# Patient Record
Sex: Male | Born: 1935 | Race: White | Hispanic: No | Marital: Married | Smoking: Former smoker
Health system: Southern US, Community
[De-identification: ages and names within clinical notes are randomized; demographics above are authoritative.]

## PROBLEM LIST (undated history)

## (undated) DIAGNOSIS — K439 Ventral hernia without obstruction or gangrene: Secondary | ICD-10-CM

## (undated) DIAGNOSIS — I480 Paroxysmal atrial fibrillation: Secondary | ICD-10-CM

## (undated) DIAGNOSIS — N529 Male erectile dysfunction, unspecified: Secondary | ICD-10-CM

## (undated) DIAGNOSIS — G250 Essential tremor: Secondary | ICD-10-CM

## (undated) DIAGNOSIS — A419 Sepsis, unspecified organism: Secondary | ICD-10-CM

## (undated) DIAGNOSIS — I639 Cerebral infarction, unspecified: Secondary | ICD-10-CM

## (undated) DIAGNOSIS — K631 Perforation of intestine (nontraumatic): Secondary | ICD-10-CM

## (undated) DIAGNOSIS — M1990 Unspecified osteoarthritis, unspecified site: Secondary | ICD-10-CM

## (undated) DIAGNOSIS — R413 Other amnesia: Secondary | ICD-10-CM

## (undated) HISTORY — DX: Essential tremor: G25.0

## (undated) HISTORY — DX: Cerebral infarction, unspecified: I63.9

## (undated) HISTORY — PX: COLON SURGERY: SHX602

## (undated) HISTORY — DX: Unspecified osteoarthritis, unspecified site: M19.90

## (undated) HISTORY — DX: Male erectile dysfunction, unspecified: N52.9

## (undated) HISTORY — DX: Other amnesia: R41.3

## (undated) HISTORY — DX: Sepsis, unspecified organism: A41.9

## (undated) HISTORY — PX: OTHER SURGICAL HISTORY: SHX169

## (undated) HISTORY — DX: Ventral hernia without obstruction or gangrene: K43.9

## (undated) HISTORY — PX: BACK SURGERY: SHX140

## (undated) HISTORY — PX: GALLBLADDER SURGERY: SHX652

## (undated) HISTORY — PX: HERNIA REPAIR: SHX51

## (undated) HISTORY — DX: Paroxysmal atrial fibrillation: I48.0

---

## 2001-07-26 ENCOUNTER — Encounter: Admission: RE | Admit: 2001-07-26 | Discharge: 2001-07-26 | Payer: Self-pay | Admitting: Family Medicine

## 2001-07-26 ENCOUNTER — Encounter: Payer: Self-pay | Admitting: Family Medicine

## 2002-03-13 ENCOUNTER — Emergency Department (HOSPITAL_COMMUNITY): Admission: EM | Admit: 2002-03-13 | Discharge: 2002-03-13 | Payer: Self-pay | Admitting: Emergency Medicine

## 2003-01-31 ENCOUNTER — Emergency Department (HOSPITAL_COMMUNITY): Admission: EM | Admit: 2003-01-31 | Discharge: 2003-02-01 | Payer: Self-pay | Admitting: Emergency Medicine

## 2003-01-31 ENCOUNTER — Encounter: Payer: Self-pay | Admitting: Emergency Medicine

## 2003-02-01 ENCOUNTER — Encounter: Payer: Self-pay | Admitting: Emergency Medicine

## 2003-02-02 ENCOUNTER — Encounter: Payer: Self-pay | Admitting: Emergency Medicine

## 2003-02-02 ENCOUNTER — Inpatient Hospital Stay (HOSPITAL_COMMUNITY): Admission: EM | Admit: 2003-02-02 | Discharge: 2003-02-10 | Payer: Self-pay | Admitting: *Deleted

## 2003-02-04 ENCOUNTER — Encounter (INDEPENDENT_AMBULATORY_CARE_PROVIDER_SITE_OTHER): Payer: Self-pay | Admitting: *Deleted

## 2003-02-06 ENCOUNTER — Encounter (INDEPENDENT_AMBULATORY_CARE_PROVIDER_SITE_OTHER): Payer: Self-pay | Admitting: Cardiology

## 2003-07-16 ENCOUNTER — Observation Stay (HOSPITAL_COMMUNITY): Admission: RE | Admit: 2003-07-16 | Discharge: 2003-07-17 | Payer: Self-pay | Admitting: Neurosurgery

## 2004-02-13 ENCOUNTER — Observation Stay (HOSPITAL_COMMUNITY): Admission: RE | Admit: 2004-02-13 | Discharge: 2004-02-15 | Payer: Self-pay | Admitting: Surgery

## 2004-07-10 ENCOUNTER — Emergency Department (HOSPITAL_COMMUNITY): Admission: EM | Admit: 2004-07-10 | Discharge: 2004-07-10 | Payer: Self-pay

## 2005-05-15 ENCOUNTER — Inpatient Hospital Stay (HOSPITAL_COMMUNITY): Admission: EM | Admit: 2005-05-15 | Discharge: 2005-05-18 | Payer: Self-pay | Admitting: Emergency Medicine

## 2005-05-17 ENCOUNTER — Encounter (INDEPENDENT_AMBULATORY_CARE_PROVIDER_SITE_OTHER): Payer: Self-pay | Admitting: Interventional Cardiology

## 2005-05-25 ENCOUNTER — Encounter: Admission: RE | Admit: 2005-05-25 | Discharge: 2005-07-05 | Payer: Self-pay | Admitting: *Deleted

## 2007-04-03 ENCOUNTER — Inpatient Hospital Stay (HOSPITAL_COMMUNITY): Admission: EM | Admit: 2007-04-03 | Discharge: 2007-04-11 | Payer: Self-pay | Admitting: Emergency Medicine

## 2007-04-05 ENCOUNTER — Encounter (INDEPENDENT_AMBULATORY_CARE_PROVIDER_SITE_OTHER): Payer: Self-pay | Admitting: Surgery

## 2008-03-11 ENCOUNTER — Inpatient Hospital Stay (HOSPITAL_COMMUNITY): Admission: EM | Admit: 2008-03-11 | Discharge: 2008-03-18 | Payer: Self-pay | Admitting: Emergency Medicine

## 2008-03-11 ENCOUNTER — Ambulatory Visit: Payer: Self-pay | Admitting: Internal Medicine

## 2008-03-18 ENCOUNTER — Encounter (INDEPENDENT_AMBULATORY_CARE_PROVIDER_SITE_OTHER): Payer: Self-pay | Admitting: Gastroenterology

## 2008-08-20 ENCOUNTER — Ambulatory Visit: Admission: RE | Admit: 2008-08-20 | Discharge: 2008-08-20 | Payer: Self-pay | Admitting: Surgery

## 2008-09-12 ENCOUNTER — Inpatient Hospital Stay (HOSPITAL_COMMUNITY): Admission: RE | Admit: 2008-09-12 | Discharge: 2008-09-18 | Payer: Self-pay | Admitting: Surgery

## 2008-09-12 ENCOUNTER — Encounter (INDEPENDENT_AMBULATORY_CARE_PROVIDER_SITE_OTHER): Payer: Self-pay | Admitting: Surgery

## 2009-01-15 ENCOUNTER — Encounter: Admission: RE | Admit: 2009-01-15 | Discharge: 2009-01-15 | Payer: Self-pay | Admitting: Family Medicine

## 2010-09-23 LAB — COMPREHENSIVE METABOLIC PANEL
AST: 22 U/L (ref 0–37)
BUN: 9 mg/dL (ref 6–23)
CO2: 25 mEq/L (ref 19–32)
Chloride: 107 mEq/L (ref 96–112)
Creatinine, Ser: 1.39 mg/dL (ref 0.4–1.5)
GFR calc non Af Amer: 49 mL/min — ABNORMAL LOW (ref >60)
Total Bilirubin: 1.8 mg/dL — ABNORMAL HIGH (ref 0.3–1.2)

## 2010-09-23 LAB — CBC
HCT: 36.8 % — ABNORMAL LOW (ref 39.0–52.0)
MCHC: 33.8 g/dL (ref 30.0–36.0)
MCV: 91.2 fL (ref 78.0–100.0)
Platelets: 169 10*3/uL (ref 150–400)
Platelets: 185 10*3/uL (ref 150–400)
RDW: 12.3 % (ref 11.5–15.5)
RDW: 12.6 % (ref 11.5–15.5)
WBC: 7.6 10*3/uL (ref 4.0–10.5)

## 2010-09-23 LAB — BASIC METABOLIC PANEL
BUN: 6 mg/dL (ref 6–23)
Calcium: 8.6 mg/dL (ref 8.4–10.5)
Creatinine, Ser: 1.1 mg/dL (ref 0.4–1.5)
GFR calc non Af Amer: >60 mL/min (ref >60)

## 2010-09-24 LAB — DIFFERENTIAL
Basophils Relative: 1 % (ref 0–1)
Eosinophils Absolute: 0.2 10*3/uL (ref 0.0–0.7)
Eosinophils Relative: 3 % (ref 0–5)
Lymphs Abs: 1.7 10*3/uL (ref 0.7–4.0)
Monocytes Relative: 9 % (ref 3–12)
Neutrophils Relative %: 54 % (ref 43–77)

## 2010-09-24 LAB — CBC
HCT: 38.3 % — ABNORMAL LOW (ref 39.0–52.0)
MCHC: 33.7 g/dL (ref 30.0–36.0)
MCV: 91.4 fL (ref 78.0–100.0)
Platelets: 190 10*3/uL (ref 150–400)
RBC: 4.19 MIL/uL — ABNORMAL LOW (ref 4.22–5.81)

## 2010-09-24 LAB — COMPREHENSIVE METABOLIC PANEL
ALT: 15 U/L (ref 0–53)
Albumin: 3.7 g/dL (ref 3.5–5.2)
Alkaline Phosphatase: 63 U/L (ref 39–117)
Calcium: 8.7 mg/dL (ref 8.4–10.5)
Potassium: 3.9 mEq/L (ref 3.5–5.1)
Sodium: 139 mEq/L (ref 135–145)
Total Protein: 6.5 g/dL (ref 6.0–8.3)

## 2010-09-24 LAB — CEA: CEA: 1 ng/mL (ref 0.0–5.0)

## 2010-10-27 NOTE — Op Note (Signed)
NAME:  [name], [name]                ACCOUNT NO.:  [id number]   MEDICAL RECORD NO.:  [id number]          PATIENT TYPE:  INP   LOCATION:  2921                         FACILITY:  [facility]   PHYSICIAN:  Clovis Pu. Cornett, M.D.DATE OF BIRTH:  [birth year]   DATE OF PROCEDURE:  04/05/2007  DATE OF DISCHARGE:                               OPERATIVE REPORT   PREOPERATIVE DIAGNOSIS:  Abdominal pain.   POSTOPERATIVE DIAGNOSES:  1. Contained perforation of a jejunal diverticulum without      peritonitis.  2. Dense intra-abdominal adhesions from previous surgery.   PROCEDURE:  1. Diagnostic laparoscopy.  2. Laparoscopic lysis of adhesions.  3. Exploratory laparotomy with small-bowel resection and anastomosis.   SURGEON:  Harriette Bouillon, M.D.   ASSISTANT:  Kelle Darting. Rennis Harding, N.P.   ANESTHESIA:  General endotracheal anesthesia.   ESTIMATED BLOOD LOSS:  150 mL.   SPECIMEN:  70 cm segment of jejunum with multiple diverticula  perforation.   DRAINS:  None.   INDICATIONS FOR PROCEDURE:  The patient is an 77 year old male with a  two-day history of abrupt mid abdominal pain.  CT scan showed what  appeared residual diverticulum with some inflammatory change.  There is  some question if he had a microperforation.  On clinical examination he  did not have signs of peritonitis, was admitted for IV antibiotics and  observation.  Over the next 48 hours he did not worsen but did not  improve and I recommended diagnostic laparoscopy for further evaluation.  He is brought today for that.   DESCRIPTION OF PROCEDURE:  The patient brought to the operating room,  placed supine.  After induction of general anesthesia.  Foley catheter  was placed in OR.  Nasogastric tube was placed.  He received additional  dose of Ancef.  I placed a 1 cm incision below his umbilicus.  Dissection was carried down to his fascia.  The fascia was opened with  my finger.  Pursestring suture of 0 Vicryl placed and a 12-mm  Hassan  cannula was placed under direct vision.  Pneumoperitoneum was created to  15 mmHg of CO2 and a laparoscope was placed.  Laparoscopy performed.  He  had a previous ventral hernia repair in his right upper quadrant and  this appeared to Parietex.  He had some omental adhesions and the colon  was pulled up in this but was not directly in contact with the mesh.  I  then placed two 5 mm ports.  One in the patient's left lower quadrant  and second in the right lower quadrant.  A 35-mm port was placed in the  patient's left upper quadrant.  I used a combination of cautery and  scissors to help dissect the omentum down away from the mesh to better  create space in the abdominal cavity so I could further evaluate.  Of  note he had no evidence of free perforation, contamination, succus  entericus or abscess I could see.  Once I got the omentum down off the  mesh somewhat, I was able then to better examine the abdominal cavity.  I found the  ligament of Treitz and took the small bowel and then began  to run the small bowel.  I encountered numerous very large jejunal  diverticula approximately 15 cm from the ligament of Treitz.  As I ran  the bowel, I encountered an area of inflammation and interloop abscess  containing the diverticula.  What appeared to be a perforation had  spontaneously sealed off.  I then ran the remainder of jejunum to the  end.  He had multiple diverticula over roughly a 70 cm segment of  jejunum.  At this point I felt that opening was the next step, so I  could resect this.  After I had reexamined the colon, I found no  evidence of injury from the lysis of adhesions.  Hemostasis of the  omentum was excellent.  I then removed all ports and allowed my CO2 to  escape.   Midline incision was used this below the umbilicus or just above it  about the width of my hand or about 10 cm.  I then dissected down to his  fascia in the midline.  I opened the fascia and was able to  place my  hand in the abdominal cavity.  I removed all the ports at this point in  time.  I then ran the small bowel and removed the segment that was  inflamed.  There are multiple large jejunal diverticula.  I removed the  entire segment and it was found be 70 cm.  He then had residual 130-140  cm plus the 15 proximal to that that were unaffected giving him in total  about 135-140 cm of some residual small bowel.  I went ahead and felt  this area should be resected since it had multiple diverticula with  inflammatory changes and especially one that had perforated.  Using a  GIA 75 stapling device, I divided the bowel at about 15 cm from the  ligament of Treitz.  I then divided distally at the end of the  diverticula which was roughly a 70 cm length.  LigaSure was used take  the mesentery down.  Specimen was passed from the field.  Remarkably he  had no contamination of the intra-abdominal cavity from this.  He had a  small interloop abscess.  A anastomosis was created a side-to-side  functional end-to-end fashion using a GIA 75 stapling device and TA-60  stapling device to close the enterotomy.  I reinforced closure with 3-0  silk sutures.  Mesenteric defect was closed with 3-0 silk.  The  anastomosis was interrogated and felt to be adequate and was widely  patent.  There is no twisting.  This was replaced back the abdominal  cavity.  Irrigation was used and suctioned out.  No evidence of any  injury to the ascending, transverse, descending colon or sigmoid colon.  He did have some sigmoid diverticula.  They were uninflamed.  I was  unable to get to the liver or the  stomach due to the adhesions from  previous mesh repair.  No evidence of injury to any other intra-  abdominal organ noted to include small bowel, large bowel, stomach,  liver or vascular structure.  Irrigation was used, suctioned out.  Counts were counted, found be correct of sponge, needle and instruments.  His fascia was  then closed in a running fashion using #1 PDS.  I used  skin staples to close the skin.  Laparoscopy sites were closed with  staples.  Sterile dressings were applied.  All  final counts of sponge,  instruments and needles were found to be correct at this portion of the  case.  The patient was then awoke taken to recovery in satisfactory  condition.  All final counts were counted, found to be correct a second  time.      Thomas A. Cornett, M.D.  Electronically Signed     TAC/MEDQ  D:  04/05/2007  T:  04/06/2007  Job:  [id number]

## 2010-10-27 NOTE — Consult Note (Signed)
NAME:  [name], [name] NO.:  [id number]   MEDICAL RECORD NO.:  [id number]          PATIENT TYPE:  INP   LOCATION:  1436                         [facility]ACILITY:  [facility]   PHYSICIAN:  Petra Kuba, M.D.    DATE OF BIRTH:  04/06/1921   DATE OF CONSULTATION:  03/13/2008  DATE OF DISCHARGE:                                 CONSULTATION   REASON FOR CONSULTATION:  We were asked to see [name] today in  consultation for possible source of sepsis being his rectal thickening  on CT scan.   HISTORY OF PRESENT ILLNESS:  This is an 77 year old gentleman with a  recent history of heme-positive stool, who is due for a colonoscopy  today as an outpatient with Dr. Wandalee Ferdinand at [facility].  He  was admitted on March 11, 2008 with fever of approximately 102  degrees, vomiting food and bile for 2 days.  He did develop diarrhea  that was initially green and went to dark brown.  He was found to have  sepsis with Klebsiella pneumonia.  The patient denied any blood in his  emesis or visible blood in the stool.  His LFTs on admission were  elevated, specifically his bilirubin was very elevated.  His CT scan  showed rectal thickening.  The patient reports no rectal pain.  No pain  with stooling.  He has seen slight red blood once on his toilet tissue  this admission and attributed this to his irritation from diarrhea.   PAST MEDICAL HISTORY:  1. Significant for diverticulitis with perforated diverticulum.  2. He is status post hemicolectomy in October 2008.  3. He is also status post cholecystectomy, back surgery and hernia      repair.  His hernia repair was unfortunately complicated by a      postop infection.  4. He has hypertension.  5. He has history of a CVA with a very mild residual of decreased      feeling on his right side.  6. History of coronary artery disease.  7. Essential tremor.  8. He has a history of paroxysmal supraventricular tachycardia.   PRIMARY  CARE PHYSICIAN:  Bryan Lemma. Manus Gunning, M.D. with [facility].   CURRENT MEDICATIONS:  1. Aggrenox.  2. Inderal.  3. Vitamins.  4. Mucinex.  5. Occasional Tylenol.   ALLERGIES:  NO KNOWN DRUG ALLERGIES.   REVIEW OF SYSTEMS:  Per HPI.  He mentions no other recent illnesses.   SOCIAL HISTORY:  Positive for a 20 year history of tobacco.  He says he  quit smoking years ago.  He rarely drinks alcohol.   FAMILY HISTORY:  Negative for colon cancer, liver or pancreatic disease.   PHYSICAL EXAMINATION:  GENERAL:  He is alert and oriented.  HEENT:  He has about 4 external mouth sores on his lips.  He refers to  them as fever blisters.  VITAL SIGNS:  Temperature is 98.5, pulse is 72, respirations are 16,  blood pressure is 137/66.  CARDIOVASCULAR:  Regular rate and rhythm.  LUNGS:  Decreased breath sounds on the right.  No  wheezes or crackles  were demonstrated.  ABDOMEN:  Soft, nontender, somewhat distended.  He has good bowel  sounds.  He has well-healed scars, one in the right upper quadrant that  is rather large from his hernia repair.  He also has a central lower  abdominal scar from his bowel surgery as well as a few laparoscopic  scars.   LABORATORY DATA:  Significant for a hemoglobin of 11.4, hematocrit 33.8,  white count 3.6, platelets 97,000. On March 10, 2008, he was  admitted with an AST of 120, ALT 80, alk phos 79, total bilirubin 6.2.  On March 13, 2008, his AST was 46, ALT 50, alk phos 53 and total  bilirubin 1.3.  Back in October 2008, his LFTs were completely normal.   DIAGNOSTICS:  1. Include an abdominal ultrasound that demonstrates no biliary      dilatation.  It does say that the left hepatic lobe is not well      visualized.  2. Abdominal and pelvic CT scan was done on March 11, 2008.  It      mentioned thickening of the rectum and questions proctitis.   ASSESSMENT:  Dr. Vida Rigger has seen and examined the patient, collected  a history and reviewed his  chart.  His assessment says that this is a  very pleasant 77 year old gentleman with Klebsiella pneumonia sepsis  from an undetermined source.  His increased LFTs are normalizing.  His  clinical symptoms are improving and he has no pain from the rectal  thickening on the CT.   PLAN:  Going forward, [name] needs a colonoscopy when he is a little  stronger either as an inpatient this admission or as an outpatient.  Perirectal abscess would be suspect of the source of sepsis.  However,  there is no clear abscess seen on CT and he has no rectal pain as one  would expect him to have with an abscess.  His biliary  system could also be a source of sepsis.  We will check an MRCP to rule  out infection/obstruction of the biliary system.  Dr. Randa Evens or Dr.  Ewing Schlein will continue to follow the patient while he is in the hospital.   Thanks very much this consultation.      Stephani Police, PA    ______________________________  Petra Kuba, M.D.    MLY/MEDQ  D:  03/13/2008  T:  03/14/2008  Job:  [id number]   cc:   Bryan Lemma. Manus Gunning, M.D.  Fax: [phone]   Graylin Shiver, M.D.  Fax: [phone]   Petra Kuba, M.D.  Fax: [phone]

## 2010-10-27 NOTE — Op Note (Signed)
NAME:  [name], [name]                ACCOUNT NO.:  [id number]   MEDICAL RECORD NO.:  [id number]          PATIENT TYPE:  INP   LOCATION:  1436                         FACILITY:  [facility]   PHYSICIAN:  Bernette Redbird, M.D.   DATE OF BIRTH:  1921-03-17   DATE OF PROCEDURE:  03/18/2008  DATE OF DISCHARGE:                               OPERATIVE REPORT   ADDENDUM TO COLONOSCOPY REPORT OF March 18, 2008:  Please see separate  dictated colonoscopy report for today on this patient.   In addition, note that SPOT [place] ink solution was injected at the site  of the mid ascending colon polyp to mark the location in the event that  a surgical resection were required.           ______________________________  Bernette Redbird, M.D.     RB/MEDQ  D:  03/18/2008  T:  03/19/2008  Job:  [id number]

## 2010-10-27 NOTE — Op Note (Signed)
NAME:  [name], [name]                ACCOUNT NO.:  [id number]   MEDICAL RECORD NO.:  [id number]          PATIENT TYPE:  INP   LOCATION:  0003                         FACILITY:  [facility]   PHYSICIAN:  Clovis Pu. Cornett, M.D.DATE OF BIRTH:  Jan 08, 1921   DATE OF PROCEDURE:  09/12/2008  DATE OF DISCHARGE:                               OPERATIVE REPORT   PREOPERATIVE DIAGNOSIS:  Dysplastic polyp x2 right colon.   POSTOPERATIVE DIAGNOSIS:  Dysplastic polyp x2 right colon.   PROCEDURE:  Laparoscopic converted to open right hemicolectomy.   SURGEON:  Dortha Schwalbe, MD   ANESTHESIA:  General endotracheal anesthesia.   ESTIMATED BLOOD LOSS:  150 mL.   ASSISTANT:  Iona Coach, MD   INDICATIONS FOR PROCEDURE:  The patient is an 77 year old patient of Dr.  Molly Maduro Buccini who was found on colonoscopy to have some large  dysplastic polyps involving his right colon.  These were tattooed at the  time.  These were dysplastic polyps too large to be removed with the  endoscope.  I saw him in consultation at the request of Dr. Matthias Hughs and  discussed a right hemicolectomy to clear this disease since these were  dysplastic and it was unknown if there was invasive malignancy involved  especially in the larger polyp in the ascending colon.  I talked to him  about surgery and had his cardiologist see him and clear him since he is  77 years old and did have some significant comorbidities.  I discussed  alternatives to surgery which include observation but the patient wished  to proceed.  Risk of bleeding, infection, anastomotic problem,  conversion to an open procedure, injury to neighboring structures,  myocardial infarction, stroke, prolonged hospitalization, prolonged  wound healing, bowel obstruction were all discussed preoperatively with  the patient as potential complications of the procedure.  He understood  this.   DESCRIPTION OF PROCEDURE:  The patient was brought to the operating  room  and placed supine.  After induction of general anesthesia the abdomen  was prepped and draped in a sterile fashion.  Foley catheter was placed  and he received preoperative antibiotics.  Attempt was made to use the  OptiView port to gain access.  In the left lower quadrant a small  incision was made and 5 mm OptiView was advanced through the abdominal  wall with the camera to guide.  We got into the abdominal cavity but due  to the significant amount of intra-abdominal adhesions could not create  space adequate for a laparoscopic procedure.  At this point in time I  elected to go ahead and convert to an open procedure.  Of note, he also  had a larger upper quadrant hernia that was a recurrent hernia from  previous repair that has been asymptomatic.   We passed laparoscopic instruments off of the field.  Then an incision  was used to gain access into the abdominal cavity.  Small incisional  hernias were noted from previous midline laparotomy.  Once we entered  the abdominal cavity in his left lower quadrant were dense intra-  abdominal adhesions  to the anterior abdominal wall and I took these down  with cautery and Metzenbaum scissors.  I examined the area where the  OptiView entered and saw no evidence of injury to the colon or small  bowel.  Previous anastomosis small bowel anastomosis was noted from  previous small bowel resection.  The right side was relatively clear of  adhesions until you got up in the right upper quadrant and previous mesh  placed from hernia repair was densely adherent to the omentum into the  hepatic flexure of the right colon.  I was able to mobilize the right  colon along the white line of Toldt using cautery until I got up into  the right upper quadrant.  I then found the transverse colon and the  hepatic flexure was densely adherent to the omentum and to the mesh.  This took quite a bit of time and we were able to lyse the adhesions to  expose the  hepatic flexure of the colon and then I was able to mobilize  this down into the operative field better.  LigaSure was used.  We had  some oozing from the mesentery controlled with cautery and a small piece  of Surgicel.  Once the entire ascending and transverse colon just distal  to the middle colic vessels were exposed there was tattoo ink in the  ascending colon from previous biopsy of the two large polyps by Dr.  Matthias Hughs.  Once this was done we went ahead and chose points of resection  in the distal small bowel and in the mid transverse colon.  Small  windows were made at this point in the bowel with hemostats.  We then  took the mesentery down with a LigaSure and 2-0 Vicryl ties until the  entire mesentery was taken up.  We then constructed a side-to-side  functional end-to-end anastomosis using a GIA75 stapling device and a  TA50 to amputate the specimen.  We passed this to a back table and I  opened it.  At the tattoo mark was a flat appearing polyp.  There were  two ink marks there but I did not see the second polyp.  There was some  thickening in the cecum as well which appeared to be a flat polyp.  This  corresponded to his endoscopic report even though I did not see two  distinct polyps the large polyp that was tattooed and biopsied was  dysplastic and was occluded in the specimen.  I saw no evidence of any  other ink marks and felt this explained his endoscopic findings.  This  was sent to pathology for evaluation.  The anastomosis was inspected and  found to be hemostatic.  A stitch was placed in-between at the distal  end of the staple line.  The mesentery was closed with 3-0 Vicryl.  Irrigation was used to suction out the right upper quadrant.  We  examined the small bowel from ligament of Treitz region all the way  through and saw no evidence of injury.  The anastomosis of the previous  small bowel resection was noted.  The sigmoid colon was grossly normal.  The liver was  grossly normal.  The mesh was kept in place in the right  upper quadrant.  The stomach was grossly normal.  We then closed the  abdominal wall with a running #1 Prolene given his previous mesh to  close it up since some of this had to be opened and interrupted 0  Novofil.  Skin staples were used to close the skin.  All final counts,  sponge, needle and instruments were found to be correct this portion of  the case.  The patient was awakened and taken to the recovery room in  satisfactory condition.      Thomas A. Cornett, M.D.  Electronically Signed     TAC/MEDQ  D:  09/12/2008  T:  09/12/2008  Job:  [id number]   cc:   Bernette Redbird, M.D.  Fax: [phone]

## 2010-10-27 NOTE — Op Note (Signed)
NAME:  [name], [name]                ACCOUNT NO.:  [id number]   MEDICAL RECORD NO.:  [id number]          PATIENT TYPE:  INP   LOCATION:  1436                         FACILITY:  [facility]   PHYSICIAN:  Bernette Redbird, M.D.   DATE OF BIRTH:  1921-02-13   DATE OF PROCEDURE:  03/18/2008  DATE OF DISCHARGE:  03/18/2008                               OPERATIVE REPORT   PROCEDURE:  Colonoscopy with biopsy and directed submucosal injection.   INDICATIONS:  An 77 year old gentleman who was admitted recently to the  hospital with Klebsiella sepsis, the source of which was never  identified, and who was noted to be heme-positive but not anemic on  admission.  He actually had been arranged to undergo colonoscopy by Dr.  Evette Cristal as an outpatient because of heme positivity prior to this  admission.   FINDINGS:  Moderately large benign-appearing proximal colonic polyps,  biopsied.  Diminutive distal polyp.  Moderately severe left-sided  diverticulosis.   PROCEDURE:  The nature, purpose and risks of the procedure had been  reviewed with the patient prior to the procedure and he provided written  consent.  Sedation for this procedure and the upper endoscopy which  preceded it totaled fentanyl 80 mcg and Versed 5 mg IV without clinical  instability.  Digital exam of the rectum was unremarkable.  The Pentax  adult video colonoscope was inserted and advanced with moderate  difficulty around somewhat angulated fixated colon using a little bit of  external abdominal compression to get the scope to enter the base of the  cecum which was identified by visualization of the ileocecal valve and  the appendiceal orifice.  I briefly attempted to enter the terminal  ileum but was not successful and did not persistent in efforts to do so.   It should be noted that there was a question of some rectal thickening  on the patient's CT scan during this admission, but no rectal mucosal  abnormalities or mass effect were  noted.   On the way in, at 30 cm, I encountered a 2 to 3 mm semipedunculated  polyp which was removed by a single cold biopsy.   In the ascending colon, 2 or 3 haustrations above the cecum, there was a  roughly 1.5 cm x 4 cm smooth, pink polypoid lesion.  It appeared it  would be difficult to remove without substantial risk, having to elevate  it and probably carve it off piecemeal.  In this 77 year old patient with a  proximal colonic location of the polyp, I felt that the risk of removal  of this lesion would probably exceed the benefits so instead I took  multiple biopsies to ascertain the histologic character of the lesion  and confirm its benignity and the absence of high-grade dysplasia.   Upon further withdrawal of the scope, I encountered a roughly 2 to 3 cm  very sessile flap verrucous polyp just a short distance above (distal)  to the above-mentioned polyp, so we were probably in the mid descending  colon.  Going through the same thought process, I felt it would be safer  and  more appropriate simply to biopsy this lesion than attempt to remove  it.  Several biopsies were obtained.   In the left colon, there was quite a bit of diverticular change.   No obvious tumors or cancer were seen.  I did not see colitis or  vascular ectasia.  Retroflexion in the rectum was unremarkable.   I really did not see any obvious source of heme positivity on this exam  since none of the polyps encountered were hemorrhagic in character.   The patient tolerated the procedure well and there no apparent  complications.   IMPRESSION:  1. Several polyps encountered as described above, biopsied but not      removed so as to reduce procedural risks.  2. Extensive left-sided diverticulosis.  3. History of heme-positive stool without obvious explanation on      current exam.  4. Radiographic finding of rectal thickening, without any evident      pathology in the rectum seen on current exam.    PLAN:  1. Await pathology results.  2. If high-grade dysplasia is found in either of the proximal colonic      polyps, I would consider an elective right hemicolectomy.  If not,      in view of the patient's 77 year old advanced age and comorbidities, I would      consider a repeat colonoscopy in a couple of years if he remains in      reasonably stable medical health, to rebiopsy the lesions and make      sure they are not evolving in an adverse histologic direction.  On      the other hand, if the patient develops more significant medical      problems, and no high-grade dysplasia is currently found, it is      unlikely that these lesions would post a threat to him in his      expected natural lifetime.           ______________________________  Bernette Redbird, M.D.     RB/MEDQ  D:  03/18/2008  T:  03/18/2008  Job:  [id number]

## 2010-10-27 NOTE — Op Note (Signed)
NAME:  [name], [name]                ACCOUNT NO.:  [id number]   MEDICAL RECORD NO.:  [id number]          PATIENT TYPE:  INP   LOCATION:  1436                         FACILITY:  [facility]   PHYSICIAN:  Bernette Redbird, M.D.   DATE OF BIRTH:  1921/04/22   DATE OF PROCEDURE:  03/18/2008  DATE OF DISCHARGE:                               OPERATIVE REPORT   PROCEDURE:  Upper endoscopy.   INDICATION:  Heme-positive stool in an 77 year old gentleman who  presented to the hospital with Klebsiella sepsis.  Of note, the patient  was on aspirin prior to admission.  He is not anemic, or only minimally  anemic with a hemoglobin of 12.7 and a normal MCV,   FINDINGS:  Small hiatal hernia with Schatzki's ring, otherwise normal  exam.   PROCEDURE:  The nature, purpose and risks of the procedure were  discussed with the patient immediately prior to the procedure.  This  procedure was done, in effect, as an add-on to the previously planned  colonoscopy.  I felt that since he had a history of aspirin exposure and  was heme-positive, it was appropriate to check out the upper tract.   Sedation was fentanyl 50 mcg and Versed 3 mg IV without clinical  instability.   The Pentax adult video endoscope was passed under direct vision.  The  larynx and vocal cords looked normal.  The esophagus was readily  entered.   The esophagus was normal apart from a partial esophageal mucosal ring  (Schatzki's ring) and a small hiatal hernia.  There was no tight ring,  stricture, reflux esophagitis, Barrett's esophagus, varices, infection  or evidence of esophageal neoplasia.   The stomach contained essentially no residual and had normal mucosa with  particular reference to the antrum, where there was no evidence of any  aspirin related inflammatory change.  No erosions, ulcers, polyps or  masses were seen anywhere in the stomach including a retroflexed view of  the cardia which was normal in appearance.  The pylorus,  duodenal bulb  and second duodenum also looked normal.   The scope was removed from the patient.  He tolerated the procedure well  and there were no apparent complications.   IMPRESSION:  1. Heme-positive stool, without evident source on current examination.  2. Minimal hiatal hernia and Schatzki's ring.   PLAN:  Proceed to colonoscopic evaluation.           ______________________________  Bernette Redbird, M.D.     RB/MEDQ  D:  03/18/2008  T:  03/18/2008  Job:  [id number]   cc:   Bryan Lemma. Manus Gunning, M.D.  Fax: [phone]

## 2010-10-27 NOTE — Discharge Summary (Signed)
NAME:  [name], [name]                ACCOUNT NO.:  [id number]   MEDICAL RECORD NO.:  [id number]          PATIENT TYPE:  INP   LOCATION:  5738                         FACILITY:  [facility]   PHYSICIAN:  [name]             DATE OF BIRTH:  01-23-1921   DATE OF ADMISSION:  04/03/2007  DATE OF DISCHARGE:  04/11/2007                               DISCHARGE SUMMARY   ADMITTING PHYSICIAN:  Randall Fus A. Hayden, M.D.   DISCHARGING PHYSICIAN:  [name]   PRIMARY CARE PHYSICIAN:  [name].   CHIEF COMPLAINT/REASON FOR ADMISSION:  [name] is an 77 year old male  patient who developed abrupt abdominal pain at 10:30 on the morning of  admission.  This began after eating.  He presented to the ER at [facility].  Because of the severity of his pain and his cardiovascular history,  there was some concern he may have an abdominal aortic aneurysm so an  emergent noncontrast CT was ordered by the emergency room physician.  No  aneurysm was identified but there was an unusual finding in the small  bowel.  There was some small bowel wall thickening and a localized  microperforation.  Since the onset of symptoms, the patient has not  passed any flatus and his pain has decreased since receiving morphine in  the ER.   ON CLINICAL EXAM:  The patient was unremarkable except his abdomen was  distended, slightly tympanitic, with a few popping isolated bowel  sounds.  He was tender with guarding in the midabdomen just above the  umbilicus, slightly tender in the pelvic area but more so on the left  than on the right.  His white count was normal at 5,000, neutrophils  were 74%, potassium 4.5, creatinine 1.3.  The patient was admitted with  a diagnosis of abdominal pain, probably secondary to  microperforation/Meckel's diverticulum.   HOSPITAL COURSE:  The patient was admitted to the step-down unit for  monitoring due to his cardiovascular history and also increased concerns  that, if he had a true  perforation, he could decompensate quickly.  Hospital day 1, a nuclear med Meckel scan was ordered to determine if he  had a Meckel's diverticulum.  This was negative but patient was still  quite tender.  He was set up to undergo diagnostic laparoscopy for  hospital day 2.  In the interim, the patient was continued on empiric  antibiotic therapy with Zosyn IV.   On April 05, 2007, the patient was taken to the OR by [name]  where he underwent a diagnostic laparoscopy with subsequent exploratory  laparotomy for a contained perforation of the jejunal diverticulum  without peritonitis with dense intra-abdominal adhesions from prior  surgeries noted.  He underwent laparoscopic lysis of adhesions and a  subsequent open small bowel resection with primary anastomosis.  The  patient tolerated the procedure well and was sent back to the step-down  unit for recovery in the postoperative period.  He did not require  mechanical ventilation was continued on IV Zosyn.   In the postoperative  period, the patient essentially did very well.  Because of concerns for possible postoperative ileus, the patient had a  PICC line placed and was started on TNA for nutritional support.  He did  have a mild postoperative ileus but his NG tube was able to be  discontinued by postoperative day #4.  His activity was increased and,  by postoperative day 5, he was advanced from a clear liquid diet to  regular diet and PCA was discontinued in favor of using Vicodin and  current bag of TNA was infused and not reordered.   On postoperative day 6, the patient was afebrile, his vital signs were  stable.  His room air sats were 95%.  He was tolerating a solid diet.  His operative pathology showed no malignancy and prealbumin level had  been checked and this was 7.7.  The patient had been ambulating with  minimal assistance in the room but had not ambulated in the hallways.  Therefore, a PT evaluation was requested to  determine if the patient was  appropriate for discharge home and, if so, if he needed any assistive  devices versus patient may need to be transferred or discharged to a  short-term rehabilitation facility.  In talking with the patient and his  wife, they felt he was doing much better since he was transferred up to  5700 and they felt he probably would not need to go to rehab but they  were agreeable to a PT evaluation.   A regards to abdominal wound, his abdomen was stable.  He had bowel  sounds.  There was staple reaction at the insertion site but no actual  redness or drainage.  The umbilical region had some redness and some  dark skin consistent with early possible tissue necrosis but there was  no drainage or tenderness there and plans were to discontinue every  other wound staple and apply Steri-Strips, discontinue all the umbilical  staples and all of the other abdominal staples outside the midline wound  prior to discharge.   At this point, pending PT evaluation, the patient is otherwise deemed  appropriate for discharge home.  We will order any recommendations from  PT for either assistive devices, etc., if indicated.   FINAL DISCHARGE DIAGNOSES:  1. 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  2. Status post diagnostic laparoscopy with laparoscopic lysis of      adhesions with subsequent exploratory laparotomy with small bowel      resection and anastomosis on April 05, 2007 by [name].  3. Postoperative ileus, resolved.  4. Severe protein calorie malnutrition requiring TNA this admission.  5. Chronic medical problems of hypertension and cerebrovascular      accident, stable this admission.   DISCHARGE MEDICATIONS:  The patient will resume the following home  medications.  1. Aggrenox one b.i.d.  2. Inderal LA 60 mg daily.  3. Vitamin E 400 mg daily.  4. Centrum Silver daily.  5. Vicodin 5/325 one  to two tablets every 4 hours as needed for pain      #40 dispensed.  6. Over-the-counter Colace 2-3 times daily to prevent constipation.   WOUND CARE:  Allow Steri-Strips to fall off, pat remaining staples and  wound dry.   DIET:  Low sodium, heart healthy.   ACTIVITY:  Increase activity slowly.  May walk up steps.  May shower.  No lifting for 5 weeks greater than 10 pounds.  No driving while taking  the Vicodin.   FOLLOW-UP APPOINTMENTS:  You are to see [name] on Wednesday,  November 5th, at 2:30 p.m.  This is in regards to having remaining wound  staples removed.      Allison L. Rennis Harding, N.P.    ______________________________  [name]    ALE/MEDQ  D:  04/11/2007  T:  04/11/2007  Job:  [id number]   cc:   Thomas A. Hayden, M.D.  Randall Lemma. Manus Gunning, M.D.

## 2010-10-27 NOTE — H&P (Signed)
NAME:  [name], [name]                ACCOUNT NO.:  [id number]   MEDICAL RECORD NO.:  [id number]          PATIENT TYPE:  INP   LOCATION:  1436                         FACILITY:  [facility]   PHYSICIAN:  Donalynn Furlong, MD      DATE OF BIRTH:  April 25, 1921   DATE OF ADMISSION:  03/11/2008  DATE OF DISCHARGE:                              HISTORY & PHYSICAL   PRIMARY CARE PHYSICIAN:  Dr. Blair Heys with [facility].   CHIEF COMPLAINT:  Fever, chills, nausea, vomiting.   HISTORY OF PRESENT ILLNESS:  [name] is an 77 year old  Caucasian male who lives in [place] with his wife.  He presented to  [facility] Emergency Room tonight with a complaint of chills, nausea,  vomiting starting on afternoon of September 26.  He had nausea, vomiting  and chills with subsequently development of fever up to 99 on September  26.  His wife provided him liquids and fever-controlled medicine which  helped him.  Next, he also had nausea, vomiting associated with fever up  to 102 on September 27.  This is why he presented to ER for further  workup.  Patient had fever, chills, nausea, vomiting but no abdominal  pain or diarrhea, constipation, blood in the stool at this time.  Patient has history of small intestinal perforation with occasional  diverticulum and peri-intestinal abscess status post small bowel  resection in October 2008.  The patient has history of cholecystectomy  in the past.  Patient denied any recent lower extremity tenderness, clot  in the legs, chest pain, shortness of breath, cough, sputum production,  urinary complaints, back pain, sinus symptoms, visual problem, ear  problem, headache, mental status changes with his current symptom.  Patient has been doing well prior to developmental this episode.  Patient denied any sick contacts also.   PAST MEDICAL HISTORY:  1. Hypertension.  2. CVA.  3. Coronary artery disease.  4. Essential tremor.  5. Paroxysmal supraventricular tachycardia.  6. Cholecystectomy.  7. History of small bowel diverticulitis and peri-intestinal abscess      status post surgical resection in October 2008.   FAMILY HISTORY:  Unremarkable.   SOCIAL HISTORY:  Patient lives with his wife in [place] and no recent  alcohol, drug or tobacco use.   REVIEW OF SYSTEM:  Positive as per HPI, otherwise negative review of  systems done for 14-system.   ALLERGIES:  None.   HOME MEDICATIONS:  Include Aggrenox, Inderal, Mucinex, multivitamin  every day along with occasional Tylenol.   PHYSICAL EXAMINATION:  VITAL SIGNS:  Blood pressure 106/55.  Pulse 80.  Respiration 20.  Temperature 101.9.  Oxygen saturation 95% on room air.  GENERAL:  Alert and oriented x3 laying in bed, not in acute distress.  CARDIOVASCULAR:  S1, S2 regular rhythm, no murmur, rub, gallop.  LUNGS:  Clear to auscultation bilaterally.  No wheezing, rales,  crackles.  ABDOMEN:  Nontender, nondistended.  Scars from previous surgery noted.  Bowel sounds present.  No organomegaly.  No splenic or liver tenderness  noted.  No flank tenderness over the kidneys.  LEGS:  No clots or no swelling in both legs.  pulses palpable in all 4  extremities.  HEENT:  Normocephalic, nontraumatic.  EYES:  Pupils equally reactive to light and accommodation.  Extraocular  muscles intact.  SINUSES:  No sinus tenderness noted.  NECK:  No thyromegaly or JVD.  No meningeal signs positive.  ORAL : mucous membrane moist, no thrush noted.  LYMPHATICS:  No lymph nodes enlarged.  SKIN:  No rash or bruits.  NEUROLOGIC:  Shows intact cranial nerves, muscle strength, sensation and  reflexes.  Speech is normal.   LABORATORIES:  Shows chest x-ray unremarkable.  Lactic acid level  normal.  Lipase level normal.  Complete metabolic panel unremarkable  except glucose 128, creatinine 1.680, bilirubin 6.2, SGOT 20, SGPT 80.  Urine analysis unremarkable.  Urine microscopy unremarkable for  infection.  CBC with differential  shows WBC 8.3, neutrophils 91%.   ASSESSMENT AND PLAN:  1. Fever, chills, nausea, vomiting with elevated limited liver enzymes      and differential of gastritis, pancreatitis, liver infection or      hepatitis, influenza, diverticulitis.  2. History of small bowel diverticulitis and peri-intestinal abscess      status post surgical resection in October 2008.  3. History of cholecystectomy.  4. History of hypertension.  5. History of coronary artery disease.  6. History of cerebrovascular accident.  7. History of essential tremor.   PLAN:  We will admit patient to telemetry bed under [facility] team with  the diagnoses of fever, chills, nausea, vomiting.  We will check vitals  and inputs and outputs every 4 hours.  We will check CBC and CMP in the  morning.  We will continue IV fluids for hydration at this time.  We  will start antibiotic Zosyn 4.5 g IV every 6 hours for coverage of  bacterial infection.  At this time there is no clue where the infection  is but the most likely source can be from abdomen given the history and  the persistent symptoms and elevated liver enzymes.  CT scan of abdomen  is pending at this time.  We will review it and decide further  management.  Patient does not have any surgical emergencies at this time  from the abdominal exam also.  Further plan according to the lab and  workup pending.      Donalynn Furlong, MD  Electronically Signed    TVP/MEDQ  D:  03/11/2008  T:  03/11/2008  Job:  [id number]   cc:   Bryan Lemma. Manus Gunning, M.D.  Fax: [phone]   Hollice Espy, M.D.

## 2010-10-27 NOTE — Discharge Summary (Signed)
NAME:  [name], [name]                ACCOUNT NO.:  [id number]   MEDICAL RECORD NO.:  [id number]          PATIENT TYPE:  INP   LOCATION:  1436                         [facility]ACILITY:  [facility]   PHYSICIAN:  Hollice Espy, M.D.DATE OF BIRTH:  07/15/1920   DATE OF ADMISSION:  03/10/2008  DATE OF DISCHARGE:  03/18/2008                               DISCHARGE SUMMARY   PRIMARY CARE PHYSICIAN:  Dr. Jeanmarie Hubert.   CONSULTANTS ON THE CASE:  Dr. Matthias Hughs, [facility].   DISCHARGE DIAGNOSES:  1. Klebsiella sepsis, source not known.  2. Liver lesions, increased in size.  3. Chronic sinusitis.  4. Colon polyps status post biopsy.  5. History of coronary artery disease and cerebrovascular accident.   DISCHARGE MEDICATIONS:  Are as follows:  Cipro 500 mg p.o. b.i.d. times  15 more days, this will cover for a total of 3 weeks therapy covering  for both his sepsis as well as for chronic sinusitis infection.  The  patient will continue all of his previous medications, these are as  follows:  1. Aggrenox 1 tablet p.o. b.i.d.  2. Inderal 60 p.o. daily.  3. Vitamin E 400 daily.  4. Multivitamin daily.  5. One cap Lutein daily.  6. Mucinex b.i.d.   HOSPITAL COURSE:  The patient an 77 year old white male, past medical  history of CAD and diverticulitis who presented with nausea, vomiting  and a temperature of 101.9.  On hospital day 2 the patient's blood  cultures came back positive for Klebsiella pneumoniae and his  antibiotics were initially changed to Cipro, Flagyl for gut coverage,  when the sensitivities came back he was kept on the Cipro only.  A  source was considered, his urine and chest x-ray were remarkable.  After  a brief discussion with infectious disease they recommended looking at  GI as well as his sinuses.  A CT scan of the sinuses showed signs of  chronic sinusitis but no signs of acute infection.  A CT scan of the  abdomen noted simply increased rectal area concerning for proctitis as  well as on the ultrasound of his abdomen this too was normal as well.  GI was consulted given the fact that no definitive source came out as  well as the increased rectal thickening.  Patient was evaluated and they  felt that the first place to look would be his gallbladder, even though  he is status post cholecystectomy he may have a bruised duct that may be  causing some increased infection.  The gallbladder area looked  unremarkable and no signs of biliary obstruction or choledocholithiasis  was noted, a total of 3 nonspecific T2 hyperintense structures were  identified within the right hepatic lobe.  The largest of these  structures demonstrated an irregular nonspecific enhancement pattern,  when compared to a CT with contrast back in 2004 there were small  corresponding hypodense lesions in these areas however these lesions  were larger.  Metastatic disease could not be excluded.  It is thought  that maybe these could be atypical hemangiomas as well.  They  recommended a PET  scan versus repeat MR following the patient's  completion of antibiotics.  The patient otherwise continued to do well.  He was noted on admission to have some mild renal failure as well as  transaminitis, it was felt his transaminitis was secondary to shock  liver and with IV fluid both his renal function and liver function  returned to completely normal.  This was not felt related to be related  to his liver findings.  The patient was evaluated by GI and following an  essentially negative for biliary obstruction MRCP the patient underwent  a colonoscopy done on March 18, 2008.  At time he was noted to have a  few colon polyps.  Dr. Matthias Hughs, given the various size of the polyps,  the patient's advanced age, 77 the risk for perforation of bladder and  rebleeding elected not to remove the polyps but actually just take  biopsies.  The patient is felt to otherwise do well.  The rest of his  medical issues were stable  during his hospitalization.  Plan will be for  patient to be discharged to home on March 18, 2008, two more weeks of  p.o. Cipro to complete a total of 3 weeks of therapy that will cover  both his sepsis as well as his sinusitis.  He will follow up with his  PCP, Dr. Manus Gunning, in the next one week's time.  At that time the polyp  biopsies will be followed up as well as a decision whether or not  patient will get a PET scan versus a followup MRI in 3 months.  The  patient's overall disposition improved.   ACTIVITY:  As tolerated.   DIET:  Heart-healthy, low residue.   Being discharged to home.      Hollice Espy, M.D.  Electronically Signed     SKK/MEDQ  D:  03/18/2008  T:  03/19/2008  Job:  [id number]   cc:   Bernette Redbird, M.D.  Fax: [phone]   Bryan Lemma. Manus Gunning, M.D.  Fax: [phone]

## 2010-10-27 NOTE — H&P (Signed)
NAME:  [name], [name]                ACCOUNT NO.:  [id number]   MEDICAL RECORD NO.:  [id number]          PATIENT TYPE:  EMS   LOCATION:  MAJO                         FACILITY:  [facility]   PHYSICIAN:  Clovis Pu. Hayden, M.D.DATE OF BIRTH:  May 04, 1921   DATE OF ADMISSION:  04/03/2007  DATE OF DISCHARGE:                              HISTORY & PHYSICAL   PRIMARY CARE PHYSICIAN:  Molly Maduro R. Manus Gunning, M.D.   CHIEF COMPLAINT:  Abdominal pain.   HISTORY OF PRESENT ILLNESS:  [name] is an 86-year male patient with  history of prior abdominal procedures and stroke in 2006.  He developed  abrupt constant abdominal pain about 10:30 this morning after eating a  light breakfast.  He presented to the ER because of the severity of the  pain.  His white count and neutrophil count was normal, but because of  the severity of pain and concerns with a possible emergent condition  such as a leaking abdominal aortic aneurysm and non-contrasted CT was  ordered by the emergency room physician.  No aneurysm was identified.  The patient did have small bowel wall thickening and localized  microperforations.  He has subsequently  received morphine and has  decreased resting abdominal pain.  He has not passed any flatus since  the onset of his pain.  Surgical evaluation has been requested for  admission.   REVIEW OF SYSTEMS:  As above.   PAST MEDICAL HISTORY:  1. Left pontine infarct by CVA 2006.  2. Hypertension.  3. Postop paroxysmal supraventricular tachycardia treated by Dr. Verdis Prime in 2004.  4. 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  5. Essential tremor.   PAST SURGICAL HISTORY:  1. Open cholecystectomy with drainage of right upper quadrant abscess      in 2004 by Dr. Gerrit Friends.  2. Right upper quadrant ventral hernia repair with mesh in 2005 by Dr.      Gerrit Friends.  3. L4-L5 back surgery to 2005.  4. Prior cataract surgery.   ALLERGIES:  NO KNOWN DRUG ALLERGIES.[name]   CURRENT MEDICATIONS:  Current medications at  home include Aggrenox,  Inderal, Mucinex D, and vitamin E.   SOCIAL HISTORY:  No alcohol.  No tobacco.  Currently he is married.   PHYSICAL EXAMINATION:  GENERAL:  Pleasant male patient complaining of  decreased resting abdominal pain.  VITAL SIGNS:  Temperature 98.1, BP 130/68, pulse 76 and regular,  respirations 20.  NEURO:  Patient is alert and oriented x3, moving all extremities x4.  No  focal deficits.  HEENT:  Head normocephalic.  Sclerae noninjected.  NECK:  Supple.  No adenopathy.  CHEST:  Bilateral lung sounds are clear to auscultation.  Respiratory  effort is nonlabored.  He is on room air.  CARDIAC:  S1 and S2.  No obvious rubs, murmurs, thrills, or gallops.  No  JVD.  ABDOMEN:  Obese but distended, slightly tympanitic.  There are a few  popping isolated bowel sounds present.  He is tender with guarding of  the mid abdomen just above the umbilicus.  He is also somewhat tender  in  the pelvic area, but less so left greater than right.  EXTREMITIES:  Symmetrical in appearance without edema, cyanosis or  clubbing.   LABORATORY DATA:  Sodium 140, potassium 4.5, CO2 27, glucose 92, BUN 14,  creatinine 1.3.  White count 5000, neutrophils 74%, hemoglobin 13.4,  platelets 216,000.  PT 13.6, PTT 33, INR 1.0.  Diagnostic CT of the  abdomen and pelvis noncontrasted again demonstrates small bowel wall  thickening with localized microperforatoins, possibly due to Meckel's  diverticulum   IMPRESSION:  Abdominal pain secondary to probable small bowel  microperforation/Meckel's diverticulum.   PLAN:  1. Admit to step-down unit, NPO status, IV fluids, and antibiotic      therapy with Zosyn.  2. At this point will not proceed with operative intervention.  Will      go ahead and get a Meckel's scan via nuclear medicine in the      morning.  Based on this scan, the patient may or may not need      operative intervention this admission.  3. We will go ahead check a CBC and CMET in the  morning.  4. Treat symptoms with morphine, Zofran and Tylenol.      [name], N.P.      [name], M.D.  Electronically Signed    ALE/MEDQ  D:  04/03/2007  T:  04/04/2007  Job:  [id number]   cc:   Bryan Lemma. Manus Gunning, M.D.

## 2010-10-27 NOTE — Discharge Summary (Signed)
NAME:  [name], [name]                ACCOUNT NO.:  [id number]   MEDICAL RECORD NO.:  [id number]          PATIENT TYPE:  INP   LOCATION:  1525                         FACILITY:  [facility]   PHYSICIAN:  Clovis Pu. Cornett, M.D.DATE OF BIRTH:  Feb 01, 1921   DATE OF ADMISSION:  09/12/2008  DATE OF DISCHARGE:  09/18/2008                               DISCHARGE SUMMARY   ADMITTING DIAGNOSIS:  Dysplastic polyp right colon.   DISCHARGE DIAGNOSIS:  Dysplastic polyp right colon.   PROCEDURE PERFORMED:  Right hemicolectomy.   BRIEF HISTORY:  The patient is an 77 year old male who had very large  dysplastic polyps in his right colon.  We discussed options with him  about surgery versus observation.  These were dysplastic and quite large  and we felt a right hemicolectomy would be in his best interest given  the size of these polyps.  He does have some other medical problems,  including heart disease and some mild dementia, but his otherwise is  good enough health for the procedure.  He wished to proceed.   HOSPITAL COURSE:  The patient's hospital course was relatively  unremarkable.  His bowel function returned by postoperative day #2 on  the Entereg protocol.  His wound was clean, dry and intact.  He was  ambulating without significant pain.  He was tolerating his diet and was  discharged home on postoperative day #6 in satisfactory condition.   DISCHARGE INSTRUCTIONS:  I will see him back next week to remove his  staples.  He will be sent home on Vicodin for pain 1-2 tablets q.4  p.r.n. pain, and I have reviewed his medical reconciliation list and  updated this.  This is outlined in his chart.  He will refrain from  lifting and driving.  He will shower tomorrow and remove his dressing  tomorrow, keep it off at that point in time.  I have recommended MiraLax  daily until his bowel function becomes more regular.   CONDITION ON DISCHARGE:  Improved.      Thomas A. Cornett, M.D.  Electronically  Signed     TAC/MEDQ  D:  09/18/2008  T:  09/18/2008  Job:  [id number]   cc:   Bernette Redbird, M.D.  Fax: [phone]

## 2010-10-30 NOTE — Op Note (Signed)
NAME:  [name], [name] NO.:  [id number]   MEDICAL RECORD NO.:  [id number]                   PATIENT TYPE:  AMB   LOCATION:  DFTL                                 FACILITY:  [facility]   PHYSICIAN:  Velora Heckler, M.D.                DATE OF BIRTH:  11-16-1920   DATE OF PROCEDURE:  02/13/2004  DATE OF DISCHARGE:                                 OPERATIVE REPORT   PREOPERATIVE DIAGNOSES:  Ventral incisional hernia right upper quadrant  abdominal wall.   POSTOPERATIVE DIAGNOSES:  Ventral incisional hernia right upper quadrant  abdominal wall.   PROCEDURE:  Laparoscopic repair of ventral incisional hernia with polyester  mesh.   SURGEON:  Velora Heckler, M.D.   ASSISTANT:  Sandria Bales. Ezzard Standing, M.D.   ANESTHESIA:  General per Quentin Cornwall. Council Mechanic, M.D.   ESTIMATED BLOOD LOSS:  Minimal.   PREPARATIONS:  Betadine.   COMPLICATIONS:  None.   INDICATIONS FOR PROCEDURE:  The patient is an 77 year old white male who had  undergone treatment for complicated acute cholecystitis in August 2004.  Postoperatively he developed a wound infection.  His wound healed by  secondary intention. He subsequently developed incisional hernia.  He now  comes for repair.   The procedure was done in OR #11 at the [facility]. The  patient was brought to the operating room, placed in a supine position on  the operating room table.  Following the administration of general  anesthesia, the patient is prepped and draped in the usual strict aseptic  fashion.  After ascertaining that an adequate level of anesthesia had been  obtained, an incision is made in the left upper quadrant near the costal  margin.  Using an Optiview trocar and the laparoscope, the trocar is  advanced through the layers of the abdominal wall and into the peritoneal  cavity without incident.  The abdomen is insufflated with carbon dioxide.  Two further operative ports are placed in both the left  and right lower  quadrants under direct vision.  Using the dissector and cautious application  of the electrocautery, the adhesions to the anterior abdominal wall and  right upper quadrant are taken down. The entire defect is delineated.  Margins of the hernia are noted. Cephalad the margin of the hernia is the  costal margin.  It extends laterally.  It extends medially across the  midline. It extends inferiorly.  Measurements taken show the defect itself  to measure approximately 27 x 13 cm.  A 30 cm x 20 cm sheet of polyester  mesh is selected. It is trimmed slightly to the appropriate dimensions. It  is marked with a marking pen on the abdominal wall and on the mesh for  orientation purposes.  Eight #0 Novofil sutures are placed circumferentially  in the mesh as stay sutures. The mesh is then moistened and rolled and  inserted under direct  vision into the peritoneal cavity. It is unrolled and  properly oriented. Incisions are made on the abdominal wall to correspond  with the stay sutures using a #11 blade. Suture tags are retrieved using the  EndoCatch through the abdominal wall.  Sutures are placed through the  abdominal wall above the level of the twelfth rib on the right side.  After  all suture tails are collected, the mesh is elevated up against the  abdominal wall and all sutures are tied securely.  Next, using an Ethicon  titanium tack applicator, two concentric rows of tacks are placed along the  margins of the mesh securing it to the abdominal wall.  Good approximation  of the mesh is noted. Good hemostasis is noted.  Pneumoperitoneum is  released and the mesh is allowed to come in proximity to the internal  viscera. All pneumoperitoneum is released. All ports are removed. The port  sites are closed with interrupted 4-0 Vicryl subcuticular sutures. The  wounds are washed and dried and Steri-Strips are applied to all wounds.  Dressings are applied to all wounds. An 11 inch  abdominal binder is placed.  The patient is awakened from anesthesia and brought to the recovery room in  stable condition.  The patient tolerated the procedure well.                                               Velora Heckler, M.D.    TMG/MEDQ  D:  02/13/2004  T:  02/13/2004  Job:  [id number]   cc:   Reinaldo Meeker, M.D.  [address]  [place]  [place] [address]  Fax: [phone]   Bryan Lemma. Manus Gunning, M.D.  [address] [place]  [place] [address]  Fax: [phone]

## 2010-10-30 NOTE — Op Note (Signed)
NAME:  [name], [name] NO.:  [id number]   MEDICAL RECORD NO.:  [id number]                   PATIENT TYPE:  INP   LOCATION:  3012                                 FACILITY:  [facility]   PHYSICIAN:  Reinaldo Meeker, M.D.              DATE OF BIRTH:  13-Oct-1920   DATE OF PROCEDURE:  07/16/2003  DATE OF DISCHARGE:                                 OPERATIVE REPORT   PREOPERATIVE DIAGNOSIS:  Herniated disk at L4-5 left.   POSTOPERATIVE DIAGNOSIS:  Herniated disk at L4-5 left.   PROCEDURE:  1. Left L4-5 intralaminar laminotomy for excision of herniated disk with     operative microscope.  2. Secondary procedure microdissection L4-5 disk and L5 nerve root.   SURGEON:  Reinaldo Meeker, M.D.   ASSISTANT:  Kathaleen Maser. Pool, M.D.   DESCRIPTION OF PROCEDURE:  After being placed in the prone position, the  patient's back was  prepped and draped in the usual sterile fashion. A  localizing x-ray was taken prior to incision to identify the appropriate  level.   A midline incision was made above the spinous processes of L4 and L5. Using  the cutting current, the incision was carried down to the spinous processes.  Subperiosteal dissection was then carried out on the left side of the  spinous processes and the lamina and the McCullough self-retaining retractor  was placed for exposure. A 2nd x-ray showed approach at the appropriate  level.   Using the high-speed drill, the inferior 2/3rds of the L4 lamina and the  medium 1/3rd of the facet joint were removed. The drill was then used to  remove the superior 1/3rd of the L5 lamina. Residual  bone and ligament of  Flavum were removed in a piecemeal fashion. The microscope was draped and  brought into the field and used until the end of the case.   Using microdissection technique, the lateral aspect of the  thecal sac and  L5 nerve root were identified. Further  coagulation was carried down toward  the canal to identify  the L4-5 disk. After coagulation __________ was  incised with a #15 blade. Using pituitary rongeurs and curets, a very  thorough disk space cleanout was carried out.   Inspection was then carried out along the midline  and slightly superior  to  the disk space. A large fragment of disk material was identified  and  removed in a piecemeal fashion. Large amounts of disk  material were removed  until no further disk  material could be identified and removed.   At this time the  thecal sac was markedly relaxed compared to prior to  diskectomy. At this point inspection was carried out in all directions for  any evidence of residual  compression and none could be identified. Large  amounts of irrigation were carried out. Any bleeding was controlled with  bipolar coagulation and Gelfoam.  The wound was then closed using interrupted Vicryl in the muscle fascia,  subcutaneous and subcuticular tissues with staples on the skin. A sterile  dressing was then applied. The patient was extubated and taken to the  recovery room in stable condition.                                               Reinaldo Meeker, M.D.    ROK/MEDQ  D:  07/16/2003  T:  07/16/2003  Job:  [id number]

## 2010-10-30 NOTE — Op Note (Signed)
NAME:  [name], [name] NO.:  [id number]   MEDICAL RECORD NO.:  [id number]                   PATIENT TYPE:  INP   LOCATION:  3312                                 FACILITY:  [facility]   PHYSICIAN:  Velora Heckler, M.D.                DATE OF BIRTH:  March 21, 1921   DATE OF PROCEDURE:  02/04/2003  DATE OF DISCHARGE:                                 OPERATIVE REPORT   PREOPERATIVE DIAGNOSIS:  Acute cholecystitis, cholelithiasis.   POSTOPERATIVE DIAGNOSIS:  Acute cholecystitis, cholelithiasis.   PROCEDURES:  1. Attempted laparoscopic cholecystectomy.  2. Open cholecystectomy with drainage of right upper quadrant abscess.   SURGEON:  Velora Heckler, M.D.   ASSISTANT:  Lebron Conners, M.D.   ANESTHESIA:  General.   ESTIMATED BLOOD LOSS:  100 mL.   PREPARATION:  Betadine.   COMPLICATIONS:  None.   INDICATIONS:  The patient is a 77 year old white male who presents to the  emergency department on August 21 with a three-day history of upper  abdominal pain and fever.  White blood cell count was elevated at 17,000.  Temperature was 101.7 degrees.  Previous ultrasound had shown gallstones and  sludge.  CT scan of the abdomen on the day of admission showed stranding  around the gallbladder.  General surgery was consulted, and the patient was  admitted.  He was placed on intravenous antibiotics.  He is now prepared and  brought to the operating room for cholecystectomy.   BODY OF REPORT:  The procedure was done in OR #17 at the [facility].  The patient is brought to the operating room, placed in  a supine position on the operating room table.  Following administration of  general anesthesia, the patient is prepped and draped in the usual strict  aseptic fashion.  After ascertaining that an adequate level of anesthesia  had been obtained, an infraumbilical incision is made with a #15 blade.  Dissection is carried down through subcutaneous  tissues.  Fascia is incised  in the midline.  The peritoneal cavity is entered cautiously.  A 0 Vicryl  pursestring suture is placed in the fascia.  A Hasson cannula is introduced  under direct vision and secured with a pursestring suture.  The abdomen is  insufflated with carbon dioxide.  The laparoscope is introduced and the  abdomen is explored.  There is a large inflammatory mass in the right upper  quadrant.  The transverse colon is densely adherent to the undersurface of  the liver.  There is moderate ascitic fluid present.  A subxiphoid port is  placed.  With blunt dissection the omentum is mobilized off of the liver.  A  cavity of frank pus is entered, and this is evacuated.  The transverse colon  is gently dissected away from the dome of the gallbladder.  The dome of the  gallbladder is exposed; however,  there are dense adhesions lateral and  medial to the gallbladder.  A portion of the gallbladder wall appears to be  necrotic.  After approximately five to 10 minutes of gentle blunt  dissection, no further progress is made in exposing the gallbladder.  Therefore, a decision is made to convert to open surgery.  Ports are removed  and 0 Vicryl pursestring suture is tied securely at the umbilicus.  Instruments are exchanged.   A right subcostal incision is made with a #10 blade.  Dissection is carried  through the subcutaneous tissues.  Fascia and muscle layers are divided with  the electrocautery and the peritoneal cavity is entered cautiously.  Blood  and purulent fluid are evacuated from the right upper quadrant.  Using  retractors, the gallbladder is exposed.  Using blunt finger dissection, the  dense adhesions between the transverse mesocolon and the gallbladder are  taken down.  The gallbladder is fully exposed.  Loculated purulent fluid  lateral to the gallbladder is also evacuated.  Beginning at the fundus of  the gallbladder, the peritoneum is incised.  The gallbladder is  carefully  dissected out of the gallbladder bed with blunt dissection and use of the  electrocautery for hemostasis.  Dissection is carried down toward the neck  of the gallbladder.  Peritoneum is incised with the electrocautery.  The  cystic artery is dissected out, doubly clipped, and divided.  Dissection is  carried down to the neck of the gallbladder.  The cystic duct is isolated,  dissected out along its length, and then triply clipped and divided.  Posterior branch of the cystic artery is also identified, doubly clipped,  and divided.  Remaining peritoneum is incised and the gallbladder is  extracted from the peritoneal cavity.  The right upper quadrant is copiously  irrigated with warm saline.  Due to the proximity of the transverse colon  and transverse mesocolon to the gallbladder and to the amount of infection  present, this procedure was certainly more difficult than usual.  Additional  OR time, approximately 30 minutes, was required due to the extensive degree  of infection and inflammation of the tissues.  The right upper quadrant is  then irrigated copiously with warm saline.  Good hemostasis is obtained with  the electrocautery and with titanium Ligaclips.  No evidence of bile leak is  identified.  In order to drain the abscess cavity, a 19 [place] Blake drain  is brought in from the lateral stab wound and placed in the bed of the  wound.  It is secured to the wound with a 3-0 nylon suture and placed to  bulb suction.  The right upper quadrant is irrigated copiously with warm  saline, which is evacuated.  Next the abdominal wall is closed in two layers  with running #1 Novofil suture.  Subcutaneous tissue is irrigated.  Skin is  closed with stainless steel staples both at the subcostal incision and  infraumbilical incision.  The drain was placed to bulb suction.  Sterile dressings are applied.  The patient is awakened from anesthesia and brought  to the recovery room in stable  condition.  The patient tolerated the  procedure well.                                               Velora Heckler, M.D.    TMG/MEDQ  D:  02/04/2003  T:  02/05/2003  Job:  [id number]

## 2010-10-30 NOTE — H&P (Signed)
NAME:  KANOA, PHILLIPPI NO.:  [id number]   MEDICAL RECORD NO.:  [id number]          PATIENT TYPE:  EMS   LOCATION:  MAJO                         FACILITY:  [facility]   PHYSICIAN:  Bevelyn Buckles. Champey, M.D.DATE OF BIRTH:  01/06/1921   DATE OF ADMISSION:  05/15/2005  DATE OF DISCHARGE:                                HISTORY & PHYSICAL   REASON FOR ADMISSION:  Code stroke.   HISTORY OF PRESENT ILLNESS:  Mr. Mizzell is an 77 year old Caucasian male with  a past medical history of an essential tremor, who presents with the acute  onset of right-sided numbness and dizziness, which started at 6:30 p.m. this  evening.  The patient also states that he felt weak on his right side and  was unsteady on his feet.  The patient's symptoms have been stable without  any progression.  The patient denies any other symptoms of headaches, vision  changes, speech changes, __________  vertigo, falls or loss of  consciousness.  A CT performed of the head showed no acute changes, with  diffuse atrophy and chronic white matter disease.  The patient is currently  lying comfortably and is cooperative throughout the entire examination.   PAST MEDICAL HISTORY:  Positive for an essential tremor.   CURRENT MEDICATIONS:  1.  Inderal.  2.  Baby aspirin.   ALLERGIES:  No known drug allergies.   FAMILY HISTORY:  Noncontributory.   SOCIAL HISTORY:  The patient currently lives with this wife.  Denies any  smoking or alcohol use.   REVIEW OF SYSTEMS:  Positive for right-sided numbness, dizziness and  unsteadiness.  The review of systems is negative as per the history of  present illness and greater than eight other systems.   PHYSICAL EXAMINATION:  VITAL SIGNS:  Temperature 97.4 degrees, blood  pressure 159/82, pulse 58, respirations 20, O2 saturation 96% on room air.  HEENT:  Normocephalic and atraumatic.  Extraocular muscles intact.  Pupils  equal, round, reactive to light.  NECK:  Supple, no  carotid bruits.  HEART:  Regular.  LUNGS:  Clear.  ABDOMEN:  Soft, nontender.  The patient does have a right upper quadrant  scar from gallbladder surgery.  EXTREMITIES:  No edema, good pulses.  NEUROLOGIC:  The patient is alert and oriented x3.  Language is full.  Memory and knowledge are within normal limits.  Cranial nerves II-XII  are  grossly intact.  Motor examination shows 4+ to 5/5 strength, normal tone.  The patient does have a questionable slight right pronator drift.  Sensory  examination:  Decreased all modalities in the right upper and lower  extremities and face.  Reflexes 1-2+ throughout and symmetric.  Toes are  downgoing bilaterally.  Cerebellar function:  The patient has slight  bilateral end point dysmetria, otherwise normal finger-to-nose and rapid  alternating movements.  Gait was not assessed, secondary to safety.  Patient's stroke scale was 2.   LABORATORY DATA:  WBC 5.9, hemoglobin 13, hematocrit 37.8, platelets 268.  Sodium 140, potassium 4.6, chloride 106, CO2 of 29, BUN 16, creatinine 1.2,  glucose 103.  CK-MB  is 1.4, troponin less than 0.05.   A CT of the head showed no acute abnormalities with diffuse atrophy and  __________ .   His electrocardiogram showed sinus bradycardia at 58 beats per minute.   IMPRESSION/PLAN:  This is an 77 year old who presents with right-sided  numbness and unsteadiness.  Concerned with the acute onset of the total  symptoms, suggestive of left subcortical infarction:  Will admit the patient  to the stroke M.D. service and check an MRI/MRA of the brain and carotid  Dopplers also checked.  Lipids and homocysteine level and cycle cardiac  enzymes x3.  Will keep the patient n.p.o. and place the patient on IV fluids  with normal saline at 80 mL per hour.  Will get PT, OT and speech  consultations.  We will increase his baby aspirin to a full dose of aspirin  and continue his Inderal for his essential tremor.  Will place the  patient  on deep venous thrombosis prophylaxis.           ______________________________  Bevelyn Buckles. Nash Shearer, M.D.     DRC/MEDQ  D:  05/15/2005  T:  05/16/2005  Job:  [id number]

## 2010-10-30 NOTE — Consult Note (Signed)
NAME:  [name], [name] NO.:  [id number]   MEDICAL RECORD NO.:  [id number]                   PATIENT TYPE:  INP   LOCATION:  3312                                 FACILITY:  [facility]   PHYSICIAN:  Lesleigh Noe, M.D.            DATE OF BIRTH:  [birth year]   DATE OF CONSULTATION:  02/04/2003  DATE OF DISCHARGE:                                   CONSULTATION   REASON FOR CONSULTATION:  Supraventricular tachycardia.   CONCLUSIONS:  1. Paroxysmal supraventricular tachycardia, now resolved on IV Cardizem.  2. Cholecystectomy for acute cholecystitis February 04, 2003 with open     cholecystectomy.  3. 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   RECOMMENDATIONS:  1. IV Cardizem.  2. EKG now and then in a.m.  3. Check CK-MB and troponins in a.m.  4. May need echocardiogram.  5. Check TSH.  6. Start low-dose beta blockers in this patient that could have underlying     coronary disease never previously identified.   COMMENTS:  The patient is 77 years of age and has had a three to four day  history of abdominal discomfort ultimately being admitted on February 02, 2003  with acute cholecystitis and undergoing an open cholecystectomy today by Dr.  Aris Georgia.  In recovery, he developed a supraventricular tachycardia at rates  greater than 200 beats per minute.  Somewhere in the recovery area he was  started on IV Cardizem and is now in sinus tachycardia with frequent PACs.  He is having no chest discomfort and has no prior history of heart disease.  Specifically, there is no history of supraventricular tachycardia,  myocardial infarction, hypertension or other significant cardiac problems.   MEDICATIONS AT HOME:  None.   ALLERGIES:  None.   HABITS:  Does not smoke.  Discontinued smoking greater than 40 years ago.  Denies ethanol.   FAMILY HISTORY:  Positive for vascular disease, coronary disease with one  brother having  congestive heart failure, another brother having bypass  surgery.  Overall, otherwise negative.   PHYSICAL EXAMINATION:  VITAL SIGNS:  Blood pressure 138/68, heart rate 120,  sinus tachycardia with frequent PACs noted.  HEENT:  Unremarkable.  NECK:  No jugular venous distention or thyromegaly.  LUNGS:  Clear.  CARDIAC:  No gallop, rub or click.  ABDOMEN:  Tender status post fresh surgery.  NEUROLOGIC:  Reveals the patient is alert and oriented x2.  No focal  deficit.   EKG not done today.  Strips from the OR reveal SVT at rates greater than 200  and also sinus tachycardia with frequent PACs.  Other laboratory data of  significance prior to surgery included BUN and creatinine of 16 and 1.3,  hemoglobin 13.5, calcium 8.6, total bilirubin 4.0, lipase 13, WBC 17.3.   DISCUSSION:  The patient is having paroxysms of supraventricular tachycardia  status post gallbladder removal.  There  is definitely some catecholamine  effect.  His baseline EKG demonstrates possible an old inferior infarction.  This will need further evaluation at some  later date once he recovers from  the surgery.                                                Lesleigh Noe, M.D.    HWS/MEDQ  D:  02/04/2003  T:  02/05/2003  Job:  [id number]   cc:   Bryan Lemma. Manus Gunning, M.D.  [address] [place]  [place] [address]  Fax: [phone]

## 2010-10-30 NOTE — Discharge Summary (Signed)
NAME:  [name], [name] NO.:  [id number]   MEDICAL RECORD NO.:  [id number]                   PATIENT TYPE:  INP   LOCATION:  2007                                 FACILITY:  [facility]   PHYSICIAN:  Velora Heckler, M.D.                DATE OF BIRTH:  1920/10/11   DATE OF ADMISSION:  02/02/2003  DATE OF DISCHARGE:  02/10/2003                                 DISCHARGE SUMMARY   REASON FOR ADMISSION:  Abdominal pain.   BRIEF HISTORY:  The patient is a 77 year old white male who presents to the  emergency department with a three-day history of upper abdominal pain and  fever.  He had been seen in [facility] on January 31, 2003.  Ultrasound showed gallstones and sludge.  White count was normal at 6.7  thousand.  He was discharged home.  Since that time the patient has had  persistent right upper quadrant abdominal pain with fever to 101.7 degrees.  White blood cell count at this time is elevated at 17,000.  CT scan of the  abdomen and pelvis showed stranding around the gallbladder consistent with  acute cholecystitis.  The patient is admitted on the general surgical  service.   HOSPITAL COURSE:  The patient was admitted and started on intravenous  antibiotics. He did tolerate a clear liquid diet.  He was prepared and taken  to the operating room on August 23,2004 where he underwent an attempt at  laparoscopic cholecystectomy which was converted to open cholecystectomy.  Postoperative course was complicated by paroxysmal supraventricular  tachycardia treated by cardiology with IV Cardizem.  The patient did well  beginning clear liquids on the first postoperative day.  He continued to  require intervention for control of atrial flutter and SVT.  A 2-D  echocardiogram was performed.  The patient's diet was advanced by the third  postoperative day.  He continued to make slow and steady improvement.  He  was prepared for discharge home on the sixth  postoperative day.   DISCHARGE PLANNING:  The patient is discharged home on February 10, 2003.   DISCHARGE MEDICATIONS:  1. Vicodin p.r.n. pain.  2. Augmentin 875 mg b.i.d. for one week.  3. Amiodarone 200 mg daily.  4. Lopressor 25 mg b.i.d.   The patient will be seen back at my office at [facility] in  one week for a wound check.    FINAL DIAGNOSIS:  Acute gangrenous cholecystitis, cholelithiasis.   CONDITION ON DISCHARGE:  Improved.                                                Velora Heckler, M.D.    TMG/MEDQ  D:  03/14/2003  T:  03/14/2003  Job:  [id number]  cc:   Lesleigh Noe, M.D.  [address]  [address]  [place]  [place] [address]  Fax: [phone]

## 2010-10-30 NOTE — Discharge Summary (Signed)
NAME:  CHANCELOR, [name]                ACCOUNT NO.:  [id number]   MEDICAL RECORD NO.:  [id number]          PATIENT TYPE:  INP   LOCATION:  6709                         FACILITY:  [facility]   PHYSICIAN:  Pramod P. Pearlean Brownie, MD    DATE OF BIRTH:  Jul 27, 1920   DATE OF ADMISSION:  05/15/2005  DATE OF DISCHARGE:  05/18/2005                                 DISCHARGE SUMMARY   DIAGNOSES AT TIME OF DISCHARGE:  1.  Left pontine infarct secondary to small vessel disease.  2.  Essential tremor.  3.  Hernia repair, September 2005.  4.  Cholecystectomy, August 2004.  5.  Back surgery in 2005.  6.  Cataract surgery bilaterally with implants.   MEDICINES AT TIME OF DISCHARGE:  1.  Aggrenox one p.o. daily times 14 days, then increased to b.i.d.  2.  Tylenol two tablets one hour before Aggrenox dose for the first times      one week p.r.n. headache.  3.  Inderal 60 mg q.h.s.  4.  Ocuvite one a day.  5.  Centrum silver one a day.  6.  Vitamin E 400 units a day.  7.  Tussionex one-half teaspoon a day.  8.  Tessalon Perles four pills a day.   STUDIES PERFORMED:  1.  CT of the brain on admission shows severe paranasal pansinusitis.      Chronic microvascular white matter disease and bilateral lacunar      infarcts. No hemorrhage.  2.  Chest x-ray shows cardiomegaly. No congestive heart failure. Bibasilar      opacities consistent with atelectasis versus  scarring.  3.  MRI of the brain shows acute left paramedian pontine infarct.      ___________ small vessel disease. Significant pansinusitis with      involvement of not only the sphenoid, ethmoid, and maxillary sinuses,      but the frontal sinuses as well. The potential for intracranial      complications from this degree of frontal sinus involvement exists.  4.  MRA of the head shows no significant proximal stenosis.  5.  EKG shows sinus bradycardia with first-degree AV block, left axis      deviation, possible lateral infarction, low voltage QRS.  6.   Carotid Doppler is normal.  7.  2-D echocardiogram has been completed and the results are pending.   LABORATORY STUDIES:  Hemoglobin A1c 5.7.  Cardiac enzymes negative times  three. Cholesterol 107, triglycerides 99, HDL 20, LDL 67. Homocysteines  pending. CBC with hematocrit of 37.8, otherwise normal. Differential normal.  Coagulation studies with PTT 42, otherwise normal. Chemistry with glucose of  107, calcium 8.0, albumin 3.4, otherwise normal.  Liver function tests  normal. Urinalysis negative.   HISTORY OF PRESENT ILLNESS:  [name] is an 77 year old right-handed white  male with a past medical history of essential tremor who presents with acute  onset of right-sided numbness and dizziness which started 6:30 p.m. the  evening of admission. The patient states he also felt weak on his right side  and was unsteady on his feet. The patient's symptoms have  been stable  without progression. A CT performed of the head showed no acute changes. The  patient will be admitted for further stroke workup. He is not a TPA  candidate secondary to ___________.   HOSPITAL COURSE:  MRI did reveal a small pontine infarct. He has been  working with physical and occupational therapy, and requires only outpatient  therapy at the time of discharge. His stroke was felt to be secondary to  five-vessel disease. As no other etiology was found, infarct was rather  small. He was started on Aggrenox for secondary stroke prevention. No other  risk factors were readily identified. A 2-D echocardiogram and homocysteine  remained pending at the time of discharge. Of note the patient did have  significant pansinusitis per MRI and CT. Will discharge home and follow up  with outpatient therapy.   CONDITION ON DISCHARGE:  The patient alert and oriented times three. Has  mild right-sided weakness which is improving. He has no focal weakness. He  has decreased sensation on the right. His chest is clear to  auscultation.  His heart rate is regular.   DISCHARGE/PLAN:  1.  Discharge home with wife.  2.  Outpatient OT and PT.  3.  Aggrenox for secondary stroke prevention.  4.  Follow up homocysteine.  5.  Follow up 2-D echocardiogram.  6.  Follow up primary care physician in two weeks.  7.  Follow up Annie Main, nurse practitioner, for stroke follow-up in four      weeks. '  8.  Follow up with Dr. Manus Gunning for follow up of sinusitis.      Annie Main, N.P.    ______________________________  Sunny Schlein. Pearlean Brownie, MD    SB/MEDQ  D:  05/18/2005  T:  05/18/2005  Job:  [id number]   cc:   Bryan Lemma. Manus Gunning, M.D.  Fax: [phone]   [facility]  [address]

## 2010-10-30 NOTE — H&P (Signed)
NAME:  [name], [name] NO.:  [id number]   MEDICAL RECORD NO.:  [id number]                   PATIENT TYPE:  INP   LOCATION:  5736                                 FACILITY:  [facility]   PHYSICIAN:  Velora Heckler, M.D.                DATE OF BIRTH:  1920/07/29   DATE OF ADMISSION:  02/02/2003  DATE OF DISCHARGE:                                HISTORY & PHYSICAL   REFERRING PHYSICIAN:  Lorre Nick, M.D.   PRIMARY CARE PHYSICIAN:  Bryan Lemma. Manus Gunning, M.D., [facility]   CHIEF COMPLAINT:  Abdominal pain, acute cholecystitis, cholelithiasis.   BRIEF HISTORY AND PHYSICAL:  The patient is a 77 year old white male, who  presents to the emergency department for evaluation of abdominal pain.  The  patient has had a three day history of upper abdominal pain associated with  fever.  He was seen initially on August 19 at [facility].  At that time his white count was 6.7.  Abdominal ultrasound was  performed which demonstrated sludge and gallstones.  The patient was  discharged.  Pain persisted and the patient developed fever to 101.7  degrees.  He returned with his family to the emergency department for  assessment.  He was found to have an elevated white blood cell count of  17,000 and a total bilirubin of 4.  CT scan of the abdomen and pelvis  demonstrated stranding around the gallbladder consistent with acute  cholecystitis.  General  surgery is consulted for management.   PAST MEDICAL HISTORY:  Status post lumbar laminectomy approximately 20 years  ago by Dr. Orland Jarred.   MEDICATIONS:  None.   ALLERGIES:  None known.   SOCIAL HISTORY:  The patient is retired from a Research scientist (life sciences).  He is  accompanied by his son, daughter-in-law, and wife.  He has a history of  tobacco use but quit 40 years ago.  He does not drink alcohol.   FAMILY HISTORY:  Unremarkable.   REVIEW OF SYSTEMS:  The patient denies previous  such abdominal pain.  He  denies any history of hepatobiliary disease.  He denies any history of  pancreatic problems.  He denies any history of jaundice or acholic stools.  He denies nausea or vomiting.  His last normal bowel movement was two days  ago.  Remainder of the review of systems is completely negative.   PHYSICAL EXAMINATION:  GENERAL:  A 77 year old bright, alert, white male on  a stretcher in the emergency department.  VITAL SIGNS:  Temperature 101.7, pulse 93, respirations 22, blood pressure  120/66.  HEENT:  Shows him to be normocephalic, atraumatic.  Sclerae are clear.  Conjunctivae are clear.  Pupils are equal and round, 2 mm bilateral, and  reactive.  Dentition is full.  Upper and lower plates in good repair.  Voice  quality is normal.  NECK:  Symmetric.  Thyroid is normal without nodularity.  There is no  anterior or posterior cervical adenopathy.  LUNGS:  Clear to auscultation.  There are no rhonchi or rales.  There is no  costovertebral angle tenderness.  CARDIAC:  Shows regular rate and rhythm without murmur.  ABDOMEN:  Soft, mildly distended.  Bowel sounds are present.  There is  tenderness to percussion and palpation in the epigastrium and right upper  quadrant.  There is tenderness to palpation in the right upper quadrant.  There is a Murphy's sign present.  There is rebound tenderness in the right  upper quadrant.  There are no surgical wounds.  GENITOURINARY:  Shows a reducible left inguinal hernia which is  asymptomatic.  EXTREMITIES:  Nontender without edema.  NEUROLOGIC:  The patient is alert and oriented without focal deficit.   LABORATORY DATA:  Laboratory studies include white count 17.4, hemoglobin  13.9, platelet count 184,000.  Electrolytes are normal.  Creatinine is 1.2.  Total bilirubin 4, lipase 13.  CK-MB normal at 1.1, troponin I normal at  0.01.   CT scan of abdomen and pelvis demonstrated stranding around the gallbladder  representing  possible cholecystitis.  Ultrasound of the gallbladder from  August 19 showed gallbladder sludge and tiny stones.  The gallbladder was  distended but without wall thickening, bile duct was normal at 6 mm.   IMPRESSION:  1. Acute cholecystitis.  2. Cholelithiasis.  3. Asymptomatic left inguinal hernia.   PLAN:  1. Admission to [facility].  2. Initiation of intravenous antibiotics.  3. Pain control.  4. Preoperative preparation for laparoscopic cholecystectomy during this     admission.   I had a lengthy discussion with the patient, his wife, his son, and his  daughter-in-law regarding the indications for admission and surgery.  I  described for them the technique of laparoscopic cholecystectomy.  We  discussed the potential risk of conversion to an open surgical procedure due  to infection and inflammation.  The patient and his family understand and  wish to proceed.  We will get him admitted to the hospital this evening and  started on intravenous antibiotics.  The timing of surgery will be  determined later during his hospital course.                                                   Velora Heckler, M.D.    TMG/MEDQ  D:  02/02/2003  T:  02/03/2003  Job:  [id number]   cc:   Bryan Lemma. Manus Gunning, M.D.  [address] [place]  [place] [address]  Fax: [phone]

## 2011-03-15 LAB — CULTURE, BLOOD (ROUTINE X 2)

## 2011-03-15 LAB — COMPREHENSIVE METABOLIC PANEL
ALT: 46
ALT: 47
ALT: 29
ALT: 39
AST: 120 — ABNORMAL HIGH
AST: 59 — ABNORMAL HIGH
AST: 90 — ABNORMAL HIGH
Albumin: 2.6 — ABNORMAL LOW
Albumin: 3 — ABNORMAL LOW
Albumin: 2.7 — ABNORMAL LOW
Alkaline Phosphatase: 53
Alkaline Phosphatase: 59
BUN: 20
BUN: 11
BUN: 9
CO2: 20
CO2: 23
CO2: 23
CO2: 24
Calcium: 7.9 — ABNORMAL LOW
Calcium: 8 — ABNORMAL LOW
Calcium: 8.7
Calcium: 8.2 — ABNORMAL LOW
Calcium: 8.4
Chloride: 104
Chloride: 110
Chloride: 107
Creatinine, Ser: 1.6 — ABNORMAL HIGH
Creatinine, Ser: 1.24
GFR calc Af Amer: 50 — ABNORMAL LOW
GFR calc Af Amer: 57 — ABNORMAL LOW
GFR calc Af Amer: >60
GFR calc non Af Amer: 41 — ABNORMAL LOW
GFR calc non Af Amer: 55 — ABNORMAL LOW
GFR calc non Af Amer: 54 — ABNORMAL LOW
Glucose, Bld: 128 — ABNORMAL HIGH
Glucose, Bld: 138 — ABNORMAL HIGH
Glucose, Bld: 115 — ABNORMAL HIGH
Glucose, Bld: 130 — ABNORMAL HIGH
Potassium: 3.9
Sodium: 137
Sodium: 138
Sodium: 136
Sodium: 137
Total Bilirubin: 4.5 — ABNORMAL HIGH
Total Bilirubin: 6.2 — ABNORMAL HIGH
Total Bilirubin: 1.1
Total Bilirubin: 1.4 — ABNORMAL HIGH
Total Protein: 5.2 — ABNORMAL LOW
Total Protein: 5.5 — ABNORMAL LOW
Total Protein: 6

## 2011-03-15 LAB — URINALYSIS, ROUTINE W REFLEX MICROSCOPIC
Glucose, UA: NEGATIVE
Ketones, ur: 15 — AB
Protein, ur: 100 — AB
Urobilinogen, UA: 4 — ABNORMAL HIGH

## 2011-03-15 LAB — CBC
HCT: 39.1
HCT: 39.6
HCT: 36.5 — ABNORMAL LOW
HCT: 38.5 — ABNORMAL LOW
Hemoglobin: 13.2
Hemoglobin: 12.7 — ABNORMAL LOW
MCHC: 33.3
MCHC: 33.8
MCHC: 33.7
MCV: 92.8
MCV: 92.3
MCV: 92.4
Platelets: 131 — ABNORMAL LOW
Platelets: 97 — ABNORMAL LOW
Platelets: 202
Platelets: 225
RBC: 4.19 — ABNORMAL LOW
RBC: 4.27
RBC: 4.08 — ABNORMAL LOW
RDW: 13
RDW: 12.7
WBC: 6.7
WBC: 8.3
WBC: 5.4

## 2011-03-15 LAB — DIFFERENTIAL
Basophils Absolute: 0
Eosinophils Absolute: 0.1
Eosinophils Relative: 0
Lymphocytes Relative: 5 — ABNORMAL LOW
Lymphs Abs: 0.4 — ABNORMAL LOW
Lymphs Abs: 0.8
Monocytes Absolute: 0.4
Monocytes Relative: 11
Neutrophils Relative %: 63
Neutrophils Relative %: 91 — ABNORMAL HIGH

## 2011-03-15 LAB — URINE MICROSCOPIC-ADD ON

## 2011-03-15 LAB — BASIC METABOLIC PANEL
BUN: 8
CO2: 22
Calcium: 8.1 — ABNORMAL LOW
Creatinine, Ser: 1.25
GFR calc Af Amer: >60
Glucose, Bld: 121 — ABNORMAL HIGH

## 2011-03-15 LAB — PROTIME-INR: Prothrombin Time: 14.7

## 2011-03-15 LAB — LACTIC ACID, PLASMA: Lactic Acid, Venous: 1.5

## 2011-03-24 LAB — BASIC METABOLIC PANEL
BUN: 11
BUN: 12
CO2: 24
Calcium: 8 — ABNORMAL LOW
Calcium: 8.1 — ABNORMAL LOW
Creatinine, Ser: 1.01
GFR calc Af Amer: >60
GFR calc non Af Amer: >60
Glucose, Bld: 135 — ABNORMAL HIGH
Glucose, Bld: 144 — ABNORMAL HIGH
Sodium: 134 — ABNORMAL LOW

## 2011-03-24 LAB — DIFFERENTIAL
Basophils Absolute: 0
Eosinophils Absolute: 0.1
Eosinophils Absolute: 0.1
Eosinophils Absolute: 0.2
Eosinophils Relative: 2
Eosinophils Relative: 2
Lymphocytes Relative: 17
Lymphocytes Relative: 20
Lymphs Abs: 1
Lymphs Abs: 1.7
Monocytes Relative: 3
Monocytes Relative: 4
Neutro Abs: 7.6
Neutrophils Relative %: 78 — ABNORMAL HIGH
Neutrophils Relative %: 78 — ABNORMAL HIGH

## 2011-03-24 LAB — URINALYSIS, ROUTINE W REFLEX MICROSCOPIC
Glucose, UA: NEGATIVE
Hgb urine dipstick: NEGATIVE
Ketones, ur: NEGATIVE
Protein, ur: NEGATIVE
Urobilinogen, UA: 2 — ABNORMAL HIGH

## 2011-03-24 LAB — COMPREHENSIVE METABOLIC PANEL
ALT: 14
ALT: 14
ALT: 15
AST: 15
AST: 20
AST: 22
Albumin: 2.2 — ABNORMAL LOW
Albumin: 3.4 — ABNORMAL LOW
BUN: 13
BUN: 18
CO2: 22
CO2: 23
CO2: 24
CO2: 26
Calcium: 8.1 — ABNORMAL LOW
Calcium: 8.3 — ABNORMAL LOW
Calcium: 8.3 — ABNORMAL LOW
Calcium: 8.6
Chloride: 103
Creatinine, Ser: 1.13
Creatinine, Ser: 1.14
Creatinine, Ser: 1.26
Creatinine, Ser: 1.29
Creatinine, Ser: 1.63 — ABNORMAL HIGH
GFR calc Af Amer: >60
GFR calc Af Amer: >60
GFR calc Af Amer: >60
GFR calc non Af Amer: 41 — ABNORMAL LOW
GFR calc non Af Amer: 55 — ABNORMAL LOW
GFR calc non Af Amer: >60
GFR calc non Af Amer: >60
Glucose, Bld: 128 — ABNORMAL HIGH
Glucose, Bld: 138 — ABNORMAL HIGH
Glucose, Bld: 159 — ABNORMAL HIGH
Sodium: 130 — ABNORMAL LOW
Sodium: 138
Total Bilirubin: 1.9 — ABNORMAL HIGH
Total Protein: 5.5 — ABNORMAL LOW
Total Protein: 6.1

## 2011-03-24 LAB — CBC
HCT: 35.7 — ABNORMAL LOW
Hemoglobin: 11.9 — ABNORMAL LOW
Hemoglobin: 12.9 — ABNORMAL LOW
MCHC: 33.5
MCHC: 33.7
MCHC: 34.3
MCV: 89.2
MCV: 90.3
MCV: 90.8
MCV: 91.4
Platelets: 199
Platelets: 216
RBC: 3.9 — ABNORMAL LOW
RBC: 4.23
RBC: 4.39
RDW: 12.9
RDW: 13.1
RDW: 13.2
WBC: 8.8

## 2011-03-24 LAB — I-STAT 8, (EC8 V) (CONVERTED LAB)
Chloride: 106
HCT: 42
Hemoglobin: 14.3
Operator id: 151321
Potassium: 4.5
TCO2: 27
pCO2, Ven: 45
pH, Ven: 7.361 — ABNORMAL HIGH

## 2011-03-24 LAB — PHOSPHORUS
Phosphorus: 1.5 — ABNORMAL LOW
Phosphorus: 2.7

## 2011-03-24 LAB — PROTIME-INR
INR: 1
Prothrombin Time: 13.6

## 2011-03-24 LAB — CHOLESTEROL, TOTAL: Cholesterol: 85

## 2011-03-24 LAB — SAMPLE TO BLOOD BANK

## 2011-03-24 LAB — POCT I-STAT CREATININE
Creatinine, Ser: 1.3
Operator id: 151321

## 2011-03-24 LAB — TRIGLYCERIDES: Triglycerides: 102

## 2011-03-24 LAB — MAGNESIUM: Magnesium: 2

## 2011-03-26 ENCOUNTER — Other Ambulatory Visit: Payer: Self-pay | Admitting: Family Medicine

## 2011-04-02 ENCOUNTER — Ambulatory Visit
Admission: RE | Admit: 2011-04-02 | Discharge: 2011-04-02 | Disposition: A | Discharge status: Home / Self Care | Payer: Medicare Other | Source: Ambulatory Visit | Attending: Family Medicine | Admitting: Family Medicine

## 2011-04-02 DIAGNOSIS — R7989 Other specified abnormal findings of blood chemistry: Secondary | ICD-10-CM

## 2012-06-20 DIAGNOSIS — L84 Corns and callosities: Secondary | ICD-10-CM | POA: Diagnosis not present

## 2012-06-20 DIAGNOSIS — L608 Other nail disorders: Secondary | ICD-10-CM | POA: Diagnosis not present

## 2012-07-18 ENCOUNTER — Encounter: Payer: Self-pay | Admitting: Neurology

## 2012-07-18 DIAGNOSIS — F0390 Unspecified dementia without behavioral disturbance: Secondary | ICD-10-CM

## 2012-07-18 DIAGNOSIS — I6990 Unspecified sequelae of unspecified cerebrovascular disease: Secondary | ICD-10-CM

## 2012-07-18 DIAGNOSIS — G478 Other sleep disorders: Secondary | ICD-10-CM

## 2012-07-18 DIAGNOSIS — R413 Other amnesia: Secondary | ICD-10-CM | POA: Insufficient documentation

## 2012-07-18 DIAGNOSIS — R269 Unspecified abnormalities of gait and mobility: Secondary | ICD-10-CM | POA: Insufficient documentation

## 2012-07-18 DIAGNOSIS — G250 Essential tremor: Secondary | ICD-10-CM | POA: Insufficient documentation

## 2012-08-07 DIAGNOSIS — I679 Cerebrovascular disease, unspecified: Secondary | ICD-10-CM | POA: Diagnosis not present

## 2012-08-07 DIAGNOSIS — R413 Other amnesia: Secondary | ICD-10-CM | POA: Diagnosis not present

## 2012-08-07 DIAGNOSIS — G250 Essential tremor: Secondary | ICD-10-CM | POA: Diagnosis not present

## 2012-09-19 DIAGNOSIS — L608 Other nail disorders: Secondary | ICD-10-CM | POA: Diagnosis not present

## 2012-10-20 ENCOUNTER — Other Ambulatory Visit: Payer: Self-pay

## 2012-10-20 MED ORDER — PROPRANOLOL HCL ER 80 MG PO CP24: 80.0000 mg | ORAL_CAPSULE | Freq: Two times a day (BID) | ORAL | Status: DC

## 2013-01-09 ENCOUNTER — Ambulatory Visit (INDEPENDENT_AMBULATORY_CARE_PROVIDER_SITE_OTHER): Payer: Medicare Other | Admitting: Nurse Practitioner

## 2013-01-09 ENCOUNTER — Encounter: Payer: Self-pay | Admitting: Nurse Practitioner

## 2013-01-09 VITALS — BP 135/64 | HR 49 | Temp 97.8°F | Ht 69.0 in | Wt 196.0 lb

## 2013-01-09 DIAGNOSIS — G250 Essential tremor: Secondary | ICD-10-CM | POA: Diagnosis not present

## 2013-01-09 DIAGNOSIS — F0392 Unspecified dementia, unspecified severity, with psychotic disturbance: Secondary | ICD-10-CM | POA: Insufficient documentation

## 2013-01-09 DIAGNOSIS — G252 Other specified forms of tremor: Secondary | ICD-10-CM | POA: Diagnosis not present

## 2013-01-09 DIAGNOSIS — R269 Unspecified abnormalities of gait and mobility: Secondary | ICD-10-CM | POA: Diagnosis not present

## 2013-01-09 DIAGNOSIS — F0390 Unspecified dementia without behavioral disturbance: Secondary | ICD-10-CM | POA: Diagnosis not present

## 2013-01-09 MED ORDER — MEMANTINE HCL ER 7 & 14 & 21 &28 MG PO CP24: 1.0000 | ORAL_CAPSULE | Freq: Every day | ORAL | Status: DC

## 2013-01-09 NOTE — Progress Notes (Signed)
[facility]  PATIENT: [name] DOB: 1921/02/17   HISTORY FROM: patienet, wife, chart REASON FOR VISIT: routine follow up, 6 month   HISTORICAL  CHIEF COMPLAINT:  Chief Complaint  Patient presents with  . Follow-up    HISTORY OF PRESENT ILLNESS: 79 year old Caucasian male with long standing benign tremors which have worsened recently, mild senile dementia, remote left Pontine brain stroke in 2006 and multifactorial gait difficulty.  Update 05/25/12 (JM): Returns for follow up since last visit on 11/24/11.  His wife feels his memory is slightly worse.  He currently continues yo drive with family as a passenger.  Tolerating Aricept 10 mg well without side effects.  Appetite is good.  His tremors are also worse, notices more in the morning before he takes his medicines and has difficulty holding cup.  Denies any problems with incontinence.  He had one fall this summer without injury.  His wife states "he did not pick his feet up".  His wife is reporting a new problem of him having paranoid dreams.  He awakens in the middle of the night disoriented and feels like someone else is present. She has noticed an increase in frequency.  Yesterday he awakened with confusion and unaware of what they were supposed to be doing.  He does not recall these dreams or conversations.  Denies any recurrent neurovascular symptoms.  Update 01/09/13 (LL):  Returns for follow up since last visit on 05/25/12.  Patient feels like his memory is the same, wife feels like it is getting worse.  When asked any question, he looks to his wife to answer for him.  She states that he more often repeatedly asks where they are going when in the car or forgets conversations the next day.  He is more often disoriented.  Wife says sometimes she must repeat things several times to him, not sure if he is not understanding or not hearing her.  Wife reports that he quit driving last year.  She is able to leave him  alone for a few hours at a time.  Wife reports that he does not ask for food and if she did not tell him when it was meal time, she would not know if he would eat.  Wife reports that since starting Seroquel at last visit, he is sleeping better with no more night-time awakenings.  Denies bowel or bladder problems.  Tolerating Aricept well without side effects.  Has had no falls at home.  Wife states his tremors are worse, but are only with intention.  Wife mentions lower extremity edema, worse on the left ankle, worse later in the day and gone in the morning.    REVIEW OF SYSTEMS: Full 14 system review of systems performed and notable only for:  Constitutional: N/A  Cardiovascular: swelling in legs  Ear/Nose/Throat: hearing loss  Skin: N/A  Eyes: N/A  Respiratory: snoring  Gastroitestinal: N/A  Hematology/Lymphatic: N/A  Endocrine: N/A Musculoskeletal:N/A  Allergy/Immunology: allergies, runny nose  Neurological: memory loss, confusion, tremor Psychiatric: N/A   ALLERGIES: Allergies  Allergen Reactions  . Topamax (Topiramate)     HOME MEDICATIONS: Outpatient Prescriptions Prior to Visit  Medication Sig Dispense Refill  . beta carotene w/minerals (OCUVITE) tablet Take 1 tablet by mouth daily.      Marland Kitchen dipyridamole-aspirin (AGGRENOX) 200-25 MG per 12 hr capsule Take 1 capsule by mouth 2 (two) times daily.      Marland Kitchen donepezil (ARICEPT) 5 MG tablet Take 10 mg by  mouth at bedtime as needed.       . fexofenadine (ALLEGRA) 180 MG tablet Take 180 mg by mouth daily.      . multivitamin-iron-minerals-folic acid (CENTRUM) chewable tablet Chew 1 tablet by mouth daily.      . propranolol ER (INDERAL LA) 80 MG 24 hr capsule Take 1 capsule (80 mg total) by mouth 2 (two) times daily.  180 capsule  1  . QUEtiapine (SEROQUEL) 25 MG tablet Take 12.5 mg by mouth at bedtime. Take a half a tab daily.      . vitamin E 400 UNIT capsule Take 400 Units by mouth as directed.       No facility-administered  medications prior to visit.    PAST MEDICAL HISTORY: Past Medical History  Diagnosis Date  . CVA (cerebral infarction)   . Memory loss   . Ventral hernia   . Sepsis   . Essential tremor   . Paroxysmal atrial fibrillation   . ED (erectile dysfunction)   . Arthritis     PAST SURGICAL HISTORY: Past Surgical History  Procedure Laterality Date  . Gallbladder surgery    . Hernia repair    . Perforated intes    . Colon surgery      FAMILY HISTORY: Family History  Problem Relation Age of Onset  . Aneurysm Mother     stomach  . Dementia Father   . Stroke Father     SOCIAL HISTORY: History   Social History  . Marital Status: Married    Spouse Name: Irving Burton    Number of Children: 2  . Years of Education: 10th   Occupational History  .     Social History Main Topics  . Smoking status: Former Smoker    Types: Cigarettes  . Smokeless tobacco: Never Used  . Alcohol Use: No  . Drug Use: No  . Sexually Active: Not on file   Other Topics Concern  . Not on file   Social History Narrative   Patient lives at home with spouse.   Caffeine Use: 1 cup daily     PHYSICAL EXAM  Filed Vitals:   01/09/13 1546  BP: 135/64  Pulse: 49  Temp: 97.8 F (36.6 C)  TempSrc: Oral  Height: 5\' 9"  (1.753 m)  Weight: 196 lb (88.905 kg)   Body mass index is 28.93 kg/(m^2).  Generalized: In no acute distress, pleasant elderly Caucasian male  Neck: Supple, no carotid bruits   Cardiac: Regular rate rhythm, no murmur, LE edema L 1+, R trace  Pulmonary: Clear to auscultation bilaterally   Musculoskeletal: mild kyphoscoliosis, stooped posture   Neurological examination   Mentation: Alert oriented to person, time (not date or year). Speech and language appear normal and fluent.  MMSE 22/30 with 2 deficits in orientation to time, 2 deficits in orientation to place, 0/3 recall, 1/3 in comprehension. Animal Fluency Testing 9 (12+ normal). Clock drawing 3/4. 5/5 FOR SERIAL 7's.  Geriatric Depression Score 4 (borderline for depression).  MILD MASKED FACES.  LOOKS TO WIFE TO ANSWER MOST QUESTIONS. (Scores last time were MMSE 18/30, AFT 8, Clock drawing 2/4) Cranial nerve II-XII: Pupils were equal round reactive to light extraocular movements were full, visual field were full on confrontational test. facial sensation and strength were normal. hearing is decreased bilaterally. Uvula tongue midline. head turning and shoulder shrug and were normal and symmetric.Tongue protrusion into cheek strength was normal. MOTOR: normal bulk and tone, full strength in the BUE, BLE, fine finger movements  normal, no pronator drift. MILD POSTURAL AND ACTION TREMORS L>R. NONE AT REST. MILD COGWHEEL RIGIDITY R>L SENSORY: normal and symmetric to light touch, pinprick, temperature, vibration COORDINATION: finger-nose-finger, heel-to-shin bilaterally, there was no truncal ataxia REFLEXES: Brachioradialis 1/1, biceps 1/1, triceps 1/1, patellar 1/1, Achilles 1/1, plantar responses were flexor bilaterally.  MILD POSITIVE MYERSON'S and PALMOMENTAL REFLEXES. GAIT/STATION: Steady but slow cautious gait with diminished bilateral arm movement and unable to do tandem walking.  Turns in stepwise motion.  Stooped posture.   DIAGNOSTIC DATA (LABS, IMAGING, TESTING) - I reviewed patient records, labs, notes, testing and imaging myself where available.  ASSESSMENT AND PLAN 79 year old Caucasian male with long standing history of benign essential tremors, moderate dementia, remote left Pontine brain stroke in 2006 and multi-factorial gait difficulty.  New problem last visit of night-time disorientation with delusions likely related to progressive dementia, better on Seroquel.  Benign essential tremor subjectively worse on Inderal LA 80 mg twice daily.  Also has mild cogwheel rigidity, decreased arm swing, stooped posture and masked facies.  MMSE 22 on Aricept 10 mg daily.  No recurrent neurovascular symptoms.   PLAN:  Discussed tremors, continue current dose Inderal LA 80 mg BID.  Do not recommend adding new medication at this time due to risk vs. Benefit.  Clinically tremors are mild. Continue Aggrenox 25-200 mg XR 12H twice daily for secondary stroke prevention. Discussed concerns for lower extremity edema.  Advised to elevate extremities as much as possible when sitting.  Reassured.  Clinically edema is 1+. Continue Seroquel 12.5 mg at hs, improved night time delusions. Continue Aricept 10 mg daily for dementia.  Discussed with patient and wife about adding Namenda XR.  Dementia seems to be progressing by subjective report.  Discussed side effects, possible benefit and cost. They decided they would like to try.  Gave prescription for Starter Titration Pack and Free Voucher for said Pack.  Also provided 1 month's worth of Namenda XR 28mg  to use after titration pack complete.  Advised to check with their insurance company and make decision if they can afford the medication before starting titration pack.  If he tolerates it, they see benefit, and want to proceed, advised to call office for prescription.  Meds ordered this encounter  Medications  . Memantine HCl ER (NAMENDA XR TITRATION PACK) 7 & 14 & 21 &28 MG CP24    Sig: Take 1 capsule by mouth at bedtime.    Dispense:  28 capsule    Refill:  0    Order Specific Question:  Supervising Provider    Answer:  Dallie Piles   Lakeita Panther NP-C 01/09/2013, 7:27 PM  [facility] [address] [place], [place] [address] [phone]

## 2013-01-09 NOTE — Patient Instructions (Signed)
Start Namenda XR for memory.  Prescription given for titration pack and free voucher. After titration pack, use 28 mg samples for next month, 1 capsule each night. Side effects to watch out for:  Stomach upset, nausea, vomiting, or dizziness.  If you want to continue, call the office for refills.  Follow up in 6 months, sooner if problems.

## 2013-01-15 DIAGNOSIS — Z961 Presence of intraocular lens: Secondary | ICD-10-CM | POA: Diagnosis not present

## 2013-01-15 DIAGNOSIS — H353190 Nonexudative age-related macular degeneration, unspecified eye, stage unspecified: Secondary | ICD-10-CM | POA: Diagnosis not present

## 2013-01-19 ENCOUNTER — Other Ambulatory Visit: Payer: Self-pay

## 2013-01-19 MED ORDER — MEMANTINE HCL ER 7 MG PO CP24: 1.0000 | ORAL_CAPSULE | ORAL | Status: DC

## 2013-01-23 ENCOUNTER — Other Ambulatory Visit: Payer: Self-pay | Admitting: Neurology

## 2013-01-23 ENCOUNTER — Telehealth: Payer: Self-pay | Admitting: Neurology

## 2013-01-23 NOTE — Telephone Encounter (Signed)
Rx has already been sent

## 2013-01-24 DIAGNOSIS — J309 Allergic rhinitis, unspecified: Secondary | ICD-10-CM | POA: Diagnosis not present

## 2013-01-24 DIAGNOSIS — I679 Cerebrovascular disease, unspecified: Secondary | ICD-10-CM | POA: Diagnosis not present

## 2013-01-24 DIAGNOSIS — H9190 Unspecified hearing loss, unspecified ear: Secondary | ICD-10-CM | POA: Diagnosis not present

## 2013-01-24 DIAGNOSIS — L570 Actinic keratosis: Secondary | ICD-10-CM | POA: Diagnosis not present

## 2013-01-24 DIAGNOSIS — R5381 Other malaise: Secondary | ICD-10-CM | POA: Diagnosis not present

## 2013-01-24 DIAGNOSIS — R413 Other amnesia: Secondary | ICD-10-CM | POA: Diagnosis not present

## 2013-01-24 DIAGNOSIS — Z79899 Other long term (current) drug therapy: Secondary | ICD-10-CM | POA: Diagnosis not present

## 2013-01-24 DIAGNOSIS — G250 Essential tremor: Secondary | ICD-10-CM | POA: Diagnosis not present

## 2013-01-24 DIAGNOSIS — G252 Other specified forms of tremor: Secondary | ICD-10-CM | POA: Diagnosis not present

## 2013-01-24 DIAGNOSIS — R5383 Other fatigue: Secondary | ICD-10-CM | POA: Diagnosis not present

## 2013-02-19 ENCOUNTER — Telehealth: Payer: Self-pay | Admitting: Nurse Practitioner

## 2013-02-19 MED ORDER — PROPRANOLOL HCL ER 60 MG PO CP24: 60.0000 mg | ORAL_CAPSULE | Freq: Every day | ORAL | Status: AC

## 2013-02-19 NOTE — Telephone Encounter (Signed)
Spoke to [place], patient's wife, who reports that since starting Namenda 28 mg XR, patient is doing much worse.  He cannot do tasks that he could do two weeks ago and is lethargic. Advised to taper off the Namenda immediately, taking 21 mg at next dose x 3 days, then 14 mg for 3 days, than 7 mg for 3 days then discontinue. If she needed more of the lower dose capsules, I advised her to drop by the office for a sample titration pack.  She also mentioned that PCP Dr. Manus Gunning reduced dose of Propranolol to 60mg  because of bradycardia. Wife had no further questions.  I advised her to call back with any other problems or concerns.

## 2013-03-23 DIAGNOSIS — Z23 Encounter for immunization: Secondary | ICD-10-CM | POA: Diagnosis not present

## 2013-06-22 DIAGNOSIS — Z23 Encounter for immunization: Secondary | ICD-10-CM | POA: Diagnosis not present

## 2013-06-26 ENCOUNTER — Telehealth: Payer: Self-pay | Admitting: Neurology

## 2013-06-26 NOTE — Telephone Encounter (Signed)
Pt's wife called in to get a refill on Quetiapine 25mg .  She stated that it needs to be sent to the Mail Order Pharmacy.  She gave the following information regarding her pharmacy services:  [facility] [phone] and that is the pharmacy helpdesk number.  She also gave web site of [email or web address]  This is the information she had on the back of her card.  Please contact once this has been ordered or if you have questions.  Please speak to [place].  Thank you

## 2013-06-27 MED ORDER — QUETIAPINE FUMARATE 25 MG PO TABS: 12.5000 mg | ORAL_TABLET | Freq: Every day | ORAL | Status: DC

## 2013-06-27 NOTE — Telephone Encounter (Signed)
THE NUMBER FOR [facility] [facility] IS [phone]--PHARMACY TO FAX REQUEST

## 2013-07-10 ENCOUNTER — Encounter (INDEPENDENT_AMBULATORY_CARE_PROVIDER_SITE_OTHER): Payer: Self-pay

## 2013-07-10 ENCOUNTER — Ambulatory Visit (INDEPENDENT_AMBULATORY_CARE_PROVIDER_SITE_OTHER): Payer: Medicare Other | Admitting: Nurse Practitioner

## 2013-07-10 ENCOUNTER — Encounter: Payer: Self-pay | Admitting: Nurse Practitioner

## 2013-07-10 VITALS — BP 115/58 | HR 52 | Temp 97.9°F | Ht 69.0 in | Wt 192.5 lb

## 2013-07-10 DIAGNOSIS — F0390 Unspecified dementia without behavioral disturbance: Secondary | ICD-10-CM | POA: Diagnosis not present

## 2013-07-10 DIAGNOSIS — I6990 Unspecified sequelae of unspecified cerebrovascular disease: Secondary | ICD-10-CM

## 2013-07-10 NOTE — Progress Notes (Signed)
PATIENT: [name] DOB: Nov 01, 1920   REASON FOR VISIT: follow up for dementia, tremor HISTORY FROM: patient, wife  HISTORY OF PRESENT ILLNESS: 80 year old Caucasian male with long standing benign tremors which have worsened recently, mild senile dementia, remote left Pontine brain stroke in 2006 and multifactorial gait difficulty.   Update 05/25/12 (JM): Returns for follow up since last visit on 11/24/11. His wife feels his memory is slightly worse. He currently continues yo drive with family as a passenger. Tolerating Aricept 10 mg well without side effects. Appetite is good. His tremors are also worse, notices more in the morning before he takes his medicines and has difficulty holding cup. Denies any problems with incontinence. He had one fall this summer without injury. His wife states "he did not pick his feet up". His wife is reporting a new problem of him having paranoid dreams. He awakens in the middle of the night disoriented and feels like someone else is present. She has noticed an increase in frequency. Yesterday he awakened with confusion and unaware of what they were supposed to be doing. He does not recall these dreams or conversations. Denies any recurrent neurovascular symptoms.   Update 01/09/13 (LL): Returns for follow up since last visit on 05/25/12. Patient feels like his memory is the same, wife feels like it is getting worse. When asked any question, he looks to his wife to answer for him. She states that he more often repeatedly asks where they are going when in the car or forgets conversations the next day. He is more often disoriented. Wife says sometimes she must repeat things several times to him, not sure if he is not understanding or not hearing her. Wife reports that he quit driving last year. She is able to leave him alone for a few hours at a time. Wife reports that he does not ask for food and if she did not tell him when it was meal time, she would not know if  he would eat. Wife reports that since starting Seroquel at last visit, he is sleeping better with no more night-time awakenings. Denies bowel or bladder problems. Tolerating Aricept well without side effects. Has had no falls at home. Wife states his tremors are worse, but are only with intention. Wife mentions lower extremity edema, worse on the left ankle, worse later in the day and gone in the morning.   UPDATE 07/10/13 (LL):  [name] returns for 6 months follow up with his wife and daughter.  They all seem to think that his memory is about the same as is his level of functioning.  He continues to sleep well on Seroquel half tablet.  He did not tolerate Namenda; had increased confusion.  He is tolerating Aricept well. He is lost 4 lbs since last visit. His appetite is good, eats 2 meals per day.   Has had no falls at home.   Tremor is the same.  No new problems.  REVIEW OF SYSTEMS: Full 14 system review of systems performed and notable only for:  Cardiovascular: swelling in legs      Respiratory: choking on food Musculoskeletal:   muscle cramps, walking difficulty     Neurological: memory loss, tremor  Psychiatric: confusion      Sleep: Daytime sleepiness  ALLERGIES: Allergies  Allergen Reactions  . Topamax [Topiramate]     HOME MEDICATIONS: Outpatient Prescriptions Prior to Visit  Medication Sig Dispense Refill  . beta carotene w/minerals (OCUVITE) tablet Take  1 tablet by mouth daily.      Marland Kitchen dipyridamole-aspirin (AGGRENOX) 200-25 MG per 12 hr capsule Take 1 capsule by mouth 2 (two) times daily.      . fexofenadine (ALLEGRA) 180 MG tablet Take 180 mg by mouth daily.      . fluticasone (FLONASE) 50 MCG/ACT nasal spray Place 2 sprays into the nose at bedtime.      . multivitamin-iron-minerals-folic acid (CENTRUM) chewable tablet Chew 1 tablet by mouth daily.      . propranolol ER (INDERAL LA) 60 MG 24 hr capsule Take 1 capsule (60 mg total) by mouth daily.      . QUEtiapine (SEROQUEL) 25  MG tablet Take 0.5 tablets (12.5 mg total) by mouth at bedtime.  45 tablet  1  . vitamin E 400 UNIT capsule Take 400 Units by mouth as directed.      . donepezil (ARICEPT) 5 MG tablet Take 10 mg by mouth at bedtime.        No facility-administered medications prior to visit.    PAST MEDICAL HISTORY: Past Medical History  Diagnosis Date  . CVA (cerebral infarction)   . Memory loss   . Ventral hernia   . Sepsis   . Essential tremor   . Paroxysmal atrial fibrillation   . ED (erectile dysfunction)   . Arthritis     PAST SURGICAL HISTORY: Past Surgical History  Procedure Laterality Date  . Gallbladder surgery    . Hernia repair    . Perforated intes    . Colon surgery      FAMILY HISTORY: Family History  Problem Relation Age of Onset  . Aneurysm Mother     stomach  . Dementia Father   . Stroke Father     SOCIAL HISTORY: History   Social History  . Marital Status: Married    Spouse Name: [name]    Number of Children: 2  . Years of Education: 10th   Occupational History  .     Social History Main Topics  . Smoking status: Former Smoker    Types: Cigarettes  . Smokeless tobacco: Never Used  . Alcohol Use: No  . Drug Use: No  . Sexual Activity: Not on file   Other Topics Concern  . Not on file   Social History Narrative   Patient lives at home with spouse.   Caffeine Use: 1 cup daily     PHYSICAL EXAM  Filed Vitals:   07/10/13 1544  BP: 115/58  Pulse: 52  Temp: 97.9 F (36.6 C)  TempSrc: Oral  Height: 5\' 9"  (1.753 m)  Weight: 192 lb 8 oz (87.317 kg)   Body mass index is 28.41 kg/(m^2).  Generalized: In no acute distress, pleasant elderly Caucasian male  Neck: Supple, no carotid bruits  Cardiac: Regular rate rhythm, no murmur, LE edema 1+ Pulmonary: Clear to auscultation bilaterally  Musculoskeletal: mild kyphoscoliosis, stooped posture   Neurological examination  Mentation: Alert oriented to person, time (not date or year). Speech and  language appear normal and fluent. MMSE 22/30 with 3 deficits in orientation to time, 1deficits in orientation to place, 0/3 recall, 1/3 in comprehension. Animal Fluency Testing 6 (12+ normal). Clock drawing 3/4. 4/5 FOR SERIAL 7's. Geriatric Depression Score 1.   (Last visit 22/30, AFT 9, Clock drawing 3/4).  LOOKS TO WIFE TO ANSWER MOST QUESTIONS.  (Scores last time were MMSE 18/30, AFT 8, Clock drawing 2/4). Cranial nerve II-XII: Pupils were equal round reactive to light extraocular movements  were full, visual field were full on confrontational test. facial sensation and strength were normal. hearing is decreased bilaterally. Uvula tongue midline. head turning and shoulder shrug and were normal and symmetric.Tongue protrusion into cheek strength was normal.  MOTOR: normal bulk and tone, full strength in the BUE, BLE, fine finger movements normal, no pronator drift. MILD POSTURAL AND ACTION TREMORS L>R. NONE AT REST. SENSORY: normal and symmetric to light touch COORDINATION: finger-nose-finger, heel-to-shin bilaterally, there was no truncal ataxia  REFLEXES: Brachioradialis 1/1, biceps 1/1, triceps 1/1, patellar 1/1, Achilles 1/1, plantar responses were flexor bilaterally. MILD POSITIVE MYERSON'S and PALMOMENTAL REFLEXES.  GAIT/STATION: Steady but slow cautious gait with diminished bilateral arm movement   DIAGNOSTIC DATA (LABS, IMAGING, TESTING) - I reviewed patient records, labs, notes, testing and imaging myself where available.   ASSESSMENT AND PLAN 80 year old Caucasian male with long standing history of benign essential tremors, moderate dementia, remote left Pontine brain stroke in 2006 and multi-factorial gait difficulty. New problem last visit of night-time disorientation with delusions likely related to progressive dementia, better on Seroquel. Essential tremor on Inderal LA 60 mg daily. MMSE 22 on Aricept 10 mg daily. Memory stable in last 6 months.  No recurrent neurovascular symptoms.    PLAN:  Continue Aggrenox 25-200 mg XR 12H twice daily for secondary stroke prevention.  Discussed concerns for lower extremity edema. Advised to elevate extremities as much as possible when sitting. Reassured. Clinically edema is 1+.  Continue Seroquel 12.5 mg at hs, improved night time delusions.  Continue Aricept 10 mg daily for dementia. Follow up in 6 months with MD, has seen NP last 3 visits.  Meds ordered this encounter  Medications  . donepezil (ARICEPT) 10 MG tablet    Sig: Take 10 mg by mouth at bedtime.    Philmore Pali, MSN, NP-C 07/10/2013, 4:05 PM [facility] [address], [place], [place] [address] [phone]  Note: This document was prepared with digital dictation and possible smart phrase technology. Any transcriptional errors that result from this process are unintentional.

## 2013-07-10 NOTE — Patient Instructions (Signed)
PLAN:  Continue Aggrenox 25-200 mg XR 12H twice daily for secondary stroke prevention and maintain strict control of hypertension with blood pressure goal below 130/90, diabetes with hemoglobin A1c goal below 7% and lipids with LDL cholesterol goal below 100 mg/dL.   Continue Seroquel 12.5 mg at hs, improved night time delusions.  Continue Aricept 10 mg daily for dementia.  Followup in the future with Dr. Leonie Man in 6 months.

## 2013-07-27 DIAGNOSIS — G250 Essential tremor: Secondary | ICD-10-CM | POA: Diagnosis not present

## 2013-07-27 DIAGNOSIS — G252 Other specified forms of tremor: Secondary | ICD-10-CM | POA: Diagnosis not present

## 2013-07-27 DIAGNOSIS — R5381 Other malaise: Secondary | ICD-10-CM | POA: Diagnosis not present

## 2013-07-27 DIAGNOSIS — H9190 Unspecified hearing loss, unspecified ear: Secondary | ICD-10-CM | POA: Diagnosis not present

## 2013-07-27 DIAGNOSIS — I679 Cerebrovascular disease, unspecified: Secondary | ICD-10-CM | POA: Diagnosis not present

## 2013-07-27 DIAGNOSIS — R5383 Other fatigue: Secondary | ICD-10-CM | POA: Diagnosis not present

## 2013-07-27 DIAGNOSIS — J309 Allergic rhinitis, unspecified: Secondary | ICD-10-CM | POA: Diagnosis not present

## 2013-07-27 DIAGNOSIS — R413 Other amnesia: Secondary | ICD-10-CM | POA: Diagnosis not present

## 2013-07-27 DIAGNOSIS — N183 Chronic kidney disease, stage 3 unspecified: Secondary | ICD-10-CM | POA: Diagnosis not present

## 2013-12-26 ENCOUNTER — Other Ambulatory Visit: Payer: Self-pay | Admitting: Neurology

## 2014-01-08 ENCOUNTER — Ambulatory Visit: Payer: Medicare Other | Admitting: Neurology

## 2014-01-24 DIAGNOSIS — N183 Chronic kidney disease, stage 3 unspecified: Secondary | ICD-10-CM | POA: Diagnosis not present

## 2014-01-24 DIAGNOSIS — I679 Cerebrovascular disease, unspecified: Secondary | ICD-10-CM | POA: Diagnosis not present

## 2014-01-24 DIAGNOSIS — Z23 Encounter for immunization: Secondary | ICD-10-CM | POA: Diagnosis not present

## 2014-01-24 DIAGNOSIS — R413 Other amnesia: Secondary | ICD-10-CM | POA: Diagnosis not present

## 2014-01-24 DIAGNOSIS — Z1331 Encounter for screening for depression: Secondary | ICD-10-CM | POA: Diagnosis not present

## 2014-01-24 DIAGNOSIS — G250 Essential tremor: Secondary | ICD-10-CM | POA: Diagnosis not present

## 2014-01-24 DIAGNOSIS — G252 Other specified forms of tremor: Secondary | ICD-10-CM | POA: Diagnosis not present

## 2014-01-24 DIAGNOSIS — J309 Allergic rhinitis, unspecified: Secondary | ICD-10-CM | POA: Diagnosis not present

## 2014-02-28 ENCOUNTER — Encounter (INDEPENDENT_AMBULATORY_CARE_PROVIDER_SITE_OTHER): Payer: Self-pay

## 2014-02-28 ENCOUNTER — Encounter: Payer: Self-pay | Admitting: Neurology

## 2014-02-28 ENCOUNTER — Ambulatory Visit (INDEPENDENT_AMBULATORY_CARE_PROVIDER_SITE_OTHER): Payer: Medicare Other

## 2014-02-28 ENCOUNTER — Ambulatory Visit (INDEPENDENT_AMBULATORY_CARE_PROVIDER_SITE_OTHER): Payer: Medicare Other | Admitting: Neurology

## 2014-02-28 VITALS — BP 112/60 | HR 57 | Wt 194.4 lb

## 2014-02-28 DIAGNOSIS — I6350 Cerebral infarction due to unspecified occlusion or stenosis of unspecified cerebral artery: Secondary | ICD-10-CM

## 2014-02-28 DIAGNOSIS — I639 Cerebral infarction, unspecified: Secondary | ICD-10-CM

## 2014-02-28 MED ORDER — DONEPEZIL HCL 23 MG PO TABS: 23.0000 mg | ORAL_TABLET | Freq: Every day | ORAL | Status: DC

## 2014-02-28 NOTE — Progress Notes (Signed)
PATIENT: [name] DOB: [birth year]   REASON FOR VISIT: follow up for dementia, tremor HISTORY FROM: patient, wife  HISTORY OF PRESENT ILLNESS: 80 year old Caucasian male with long standing benign tremors which have worsened recently, mild senile dementia, remote left Pontine brain stroke in 2006 and multifactorial gait difficulty.   Update 05/25/12 (JM): Returns for follow up since last visit on 11/24/11. His wife feels his memory is slightly worse. He currently continues yo drive with family as a passenger. Tolerating Aricept 10 mg well without side effects. Appetite is good. His tremors are also worse, notices more in the morning before he takes his medicines and has difficulty holding cup. Denies any problems with incontinence. He had one fall this summer without injury. His wife states "he did not pick his feet up". His wife is reporting a new problem of him having paranoid dreams. He awakens in the middle of the night disoriented and feels like someone else is present. She has noticed an increase in frequency. Yesterday he awakened with confusion and unaware of what they were supposed to be doing. He does not recall these dreams or conversations. Denies any recurrent neurovascular symptoms.   Update 01/09/13 (LL): Returns for follow up since last visit on 05/25/12. Patient feels like his memory is the same, wife feels like it is getting worse. When asked any question, he looks to his wife to answer for him. She states that he more often repeatedly asks where they are going when in the car or forgets conversations the next day. He is more often disoriented. Wife says sometimes she must repeat things several times to him, not sure if he is not understanding or not hearing her. Wife reports that he quit driving last year. She is able to leave him alone for a few hours at a time. Wife reports that he does not ask for food and if she did not tell him when it was meal time, she would not know if  he would eat. Wife reports that since starting Seroquel at last visit, he is sleeping better with no more night-time awakenings. Denies bowel or bladder problems. Tolerating Aricept well without side effects. Has had no falls at home. Wife states his tremors are worse, but are only with intention. Wife mentions lower extremity edema, worse on the left ankle, worse later in the day and gone in the morning.   UPDATE 07/10/13 (LL):  [name] returns for 6 months follow up with his wife and daughter.  They all seem to think that his memory is about the same as is his level of functioning.  He continues to sleep well on Seroquel half tablet.  He did not tolerate Namenda; had increased confusion.  He is tolerating Aricept well. He is lost 4 lbs since last visit. His appetite is good, eats 2 meals per day.   Has had no falls at home.   Tremor is the same.  No new problems. UPDATE 02/28/2014 (PS) : He returns for followup last visit it ago. He recommended by his wife and daughter who feel that their he continues to have significant short-term memory and cognitive difficulties. He needs help with some activities of daily living like bathing as well as operating TV. More but is mostly independent in other activities. His gait is slow and he drags his left foot but he had 2 falls in the last 1 year. He has not had any behavior or agitation issues but continues to  have some night dreams and takes Seroquel half tablet at night. The patient continues to have hand tremors which bother him only with activities like eating but he can manage. He has not had any recurrent stroke or TIA symptoms. He remains on Aricept 10 mg daily but in the past has not been able to tolerate Namenda. REVIEW OF SYSTEMS: Full 14 system review of systems performed and notable only for:  Activity and appetite change, runny nose, cough, [place] in, allergies, memory loss, numbness, tremors, confusion, hallucinations, daytime sleepiness and sleep  talking.  ALLERGIES: Allergies  Allergen Reactions  . Topamax [Topiramate]     HOME MEDICATIONS: Outpatient Prescriptions Prior to Visit  Medication Sig Dispense Refill  . beta carotene w/minerals (OCUVITE) tablet Take 1 tablet by mouth daily.      Marland Kitchen dipyridamole-aspirin (AGGRENOX) 200-25 MG per 12 hr capsule Take 1 capsule by mouth 2 (two) times daily.      . fexofenadine (ALLEGRA) 180 MG tablet Take 180 mg by mouth daily.      . fluticasone (FLONASE) 50 MCG/ACT nasal spray Place 2 sprays into the nose at bedtime.      . multivitamin-iron-minerals-folic acid (CENTRUM) chewable tablet Chew 1 tablet by mouth daily.      . propranolol ER (INDERAL LA) 60 MG 24 hr capsule Take 1 capsule (60 mg total) by mouth daily.      . QUEtiapine (SEROQUEL) 25 MG tablet TAKE ONE-HALF (1/2) TABLET BY MOUTH AT BEDTIME  45 tablet  0  . vitamin E 400 UNIT capsule Take 400 Units by mouth as directed.      . donepezil (ARICEPT) 10 MG tablet Take 10 mg by mouth at bedtime.       No facility-administered medications prior to visit.    PAST MEDICAL HISTORY: Past Medical History  Diagnosis Date  . CVA (cerebral infarction)   . Memory loss   . Ventral hernia   . Sepsis   . Essential tremor   . Paroxysmal atrial fibrillation   . ED (erectile dysfunction)   . Arthritis     PAST SURGICAL HISTORY: Past Surgical History  Procedure Laterality Date  . Gallbladder surgery    . Hernia repair    . Perforated intes    . Colon surgery      FAMILY HISTORY: Family History  Problem Relation Age of Onset  . Aneurysm Mother     stomach  . Dementia Father   . Stroke Father     SOCIAL HISTORY: History   Social History  . Marital Status: Married    Spouse Name: [name]    Number of Children: 2  . Years of Education: 10th   Occupational History  .     Social History Main Topics  . Smoking status: Former Smoker    Types: Cigarettes  . Smokeless tobacco: Never Used  . Alcohol Use: No  . Drug Use:  No  . Sexual Activity: Not on file   Other Topics Concern  . Not on file   Social History Narrative   Patient lives at home with spouse.   Caffeine Use: 1 cup daily     PHYSICAL EXAM  Filed Vitals:   02/28/14 1058  BP: 112/60  Pulse: 57  Weight: 194 lb 6.4 oz (88.179 kg)   Body mass index is 28.69 kg/(m^2).  Generalized: In no acute distress, pleasant elderly Caucasian male  Neck: Supple, no carotid bruits  Cardiac: Regular rate rhythm, no murmur, LE edema 1+ Pulmonary:  Clear to auscultation bilaterally  Musculoskeletal: mild kyphoscoliosis, stooped posture   Neurological examination  Mentation: Alert oriented to person, time (not date or year). Speech and language appear normal and fluent. MMSE 20/30 ( was 22/30 last visit) with 3 deficits in orientation to time, 1deficits in orientation to place, 0/3 recall, 1/3 in comprehension. Animal Fluency Testing 7 (12+ normal). Clock drawing 2/4. 4/5 FOR SERIAL 7's. Geriatric Depression Score 1.    (Scores 01/09/13 were MMSE 18/30, AFT 8, Clock drawing 2/4). Positive glabellar tap and palmomental reflex left greater than right Cranial nerve II-XII: Pupils were equal round reactive to light extraocular movements were full, visual field were full on confrontational test. facial sensation and strength were normal. hearing is decreased bilaterally. Uvula tongue midline. head turning and shoulder shrug and were normal and symmetric.Tongue protrusion into cheek strength was normal.  MOTOR: normal bulk and tone, full strength in the BUE, BLE, fine finger movements normal, no pronator drift. MILD POSTURAL AND ACTION TREMORS L>R. NONE AT REST. No bradykinesia or cogwheel rigidity. SENSORY: normal and symmetric to light touch COORDINATION: finger-nose-finger, heel-to-shin bilaterally, there was no truncal ataxia  REFLEXES: Brachioradialis 1/1, biceps 1/1, triceps 1/1, patellar 1/1, Achilles 1/1, plantar responses were flexor bilaterally. MILD POSITIVE  MYERSON'S and PALMOMENTAL REFLEXES.  GAIT/STATION: Steady but slow cautious gait with diminished bilateral arm movement and slight stooped posture  DIAGNOSTIC DATA (LABS, IMAGING, TESTING) - I reviewed patient records, labs, notes, testing and imaging myself where available.   ASSESSMENT AND PLAN 80 year old Caucasian male with long standing history of benign essential tremors, moderate dementia, remote left Pontine brain stroke in 2006 and multi-factorial gait difficulty. New problem last visit of night-time disorientation with delusions likely related to progressive dementia, better on Seroquel. Essential tremor on Inderal LA 60 mg daily. MMSE 20 on Aricept 10 mg daily. Memory slight decline in last 6 months.  No recurrent neurovascular symptoms.   PLAN:  Continue Aggrenox 25-200 mg XR 12H twice daily for secondary stroke prevention. Check screening carotid dopplers. Discussed concerns for fall risk and fall precautions..   Continue Seroquel 12.5 mg at hs, improved night time delusions. Will not increase due to daytime drowsiness. Increase Aricept dose to 23 mg mg daily for dementia.if tolerated. Follow up in 2 months with Charlott Holler, NP  Meds ordered this encounter  Medications  . donepezil (ARICEPT) 23 MG TABS tablet    Sig: Take 1 tablet (23 mg total) by mouth at bedtime.    Dispense:  30 tablet    Refill:  3    Antony Contras, MD  02/28/2014, 12:45 PM [facility] [address] [place], [place] [address] [phone]  Note: This document was prepared with digital dictation and possible smart phrase technology. Any transcriptional errors that result from this process are unintentional.

## 2014-02-28 NOTE — Patient Instructions (Signed)
I had a long discussion with the patient, wife and daughter regarding his dementia which seems to be slowly progressive. I recommend we switch to Aricept 23 mg if he can tolerate it we have I have given her prescription and discussed possible side effects with the family and answered questions. He has been unable to tolerate Namenda in the past. Check followup carotid ultrasound study. Continue Aggrenox for stroke prevention. Return for followup in 6 months with Charlott Holler, nurse practitioner or call earlier if necessary.

## 2014-03-01 ENCOUNTER — Telehealth: Payer: Self-pay | Admitting: Radiology

## 2014-03-01 NOTE — Telephone Encounter (Signed)
Carotid doppler completed on 02/28/14 sf

## 2014-03-28 DIAGNOSIS — H3531 Nonexudative age-related macular degeneration: Secondary | ICD-10-CM | POA: Diagnosis not present

## 2014-03-28 DIAGNOSIS — Z961 Presence of intraocular lens: Secondary | ICD-10-CM | POA: Diagnosis not present

## 2014-03-29 DIAGNOSIS — Z23 Encounter for immunization: Secondary | ICD-10-CM | POA: Diagnosis not present

## 2014-04-01 ENCOUNTER — Other Ambulatory Visit: Payer: Self-pay | Admitting: Nurse Practitioner

## 2014-04-02 DIAGNOSIS — D225 Melanocytic nevi of trunk: Secondary | ICD-10-CM | POA: Diagnosis not present

## 2014-04-02 DIAGNOSIS — D485 Neoplasm of uncertain behavior of skin: Secondary | ICD-10-CM | POA: Diagnosis not present

## 2014-04-02 DIAGNOSIS — L821 Other seborrheic keratosis: Secondary | ICD-10-CM | POA: Diagnosis not present

## 2014-04-29 ENCOUNTER — Encounter: Payer: Self-pay | Admitting: Neurology

## 2014-05-16 ENCOUNTER — Other Ambulatory Visit: Payer: Self-pay | Admitting: Neurology

## 2014-05-28 DIAGNOSIS — R32 Unspecified urinary incontinence: Secondary | ICD-10-CM | POA: Diagnosis not present

## 2014-05-28 DIAGNOSIS — R5383 Other fatigue: Secondary | ICD-10-CM | POA: Diagnosis not present

## 2014-08-01 DIAGNOSIS — G250 Essential tremor: Secondary | ICD-10-CM | POA: Diagnosis not present

## 2014-08-01 DIAGNOSIS — I639 Cerebral infarction, unspecified: Secondary | ICD-10-CM | POA: Diagnosis not present

## 2014-08-01 DIAGNOSIS — N183 Chronic kidney disease, stage 3 (moderate): Secondary | ICD-10-CM | POA: Diagnosis not present

## 2014-08-01 DIAGNOSIS — R413 Other amnesia: Secondary | ICD-10-CM | POA: Diagnosis not present

## 2014-08-01 DIAGNOSIS — J309 Allergic rhinitis, unspecified: Secondary | ICD-10-CM | POA: Diagnosis not present

## 2014-08-29 ENCOUNTER — Ambulatory Visit (INDEPENDENT_AMBULATORY_CARE_PROVIDER_SITE_OTHER): Payer: Medicare Other | Admitting: Neurology

## 2014-08-29 ENCOUNTER — Encounter: Payer: Self-pay | Admitting: Neurology

## 2014-08-29 ENCOUNTER — Ambulatory Visit: Payer: Medicare Other | Admitting: Nurse Practitioner

## 2014-08-29 VITALS — BP 114/63 | HR 55 | Ht 69.0 in | Wt 186.8 lb

## 2014-08-29 DIAGNOSIS — F0391 Unspecified dementia with behavioral disturbance: Secondary | ICD-10-CM

## 2014-08-29 DIAGNOSIS — F03918 Unspecified dementia, unspecified severity, with other behavioral disturbance: Secondary | ICD-10-CM

## 2014-08-29 MED ORDER — MEMANTINE HCL 28 X 5 MG & 21 X 10 MG PO TABS: ORAL_TABLET | ORAL | Status: DC

## 2014-08-29 NOTE — Patient Instructions (Signed)
I had a long discussion with the patient, wife and daughter regarding his dementia and progressive cognitive decline. Lack of mobility of highly effective medications and treatment options. I recommend a repeat trial of Namenda since he is having significant cognitive issues and I'm not sure as to whether he got a fair trial in the past. Have discussed possible side effects with the patient and daughter and advised him to call me if needed. Continue Aricept 23 mg daily. Continue Aggrenox for stroke prevention and strict control of hypertension with blood pressure goal below 130/90. Patient has h/o  atrial fibrillation but is not for anticoagulation candidate given his gait and balance difficulties and dementia. Patient was advised for prevention precautions. Return for follow-up in 3 months or call earlier if necessary Fall Prevention and [place] cause injuries and can affect all age groups. It is possible to use preventive measures to significantly decrease the likelihood of falls. There are many simple measures which can make your home safer and prevent falls. OUTDOORS  Repair cracks and edges of walkways and driveways.  Remove high doorway thresholds.  Trim shrubbery on the main path into your home.  Have good outside lighting.  Clear walkways of tools, rocks, debris, and clutter.  Check that handrails are not broken and are securely fastened. Both sides of steps should have handrails.  Have leaves, snow, and ice cleared regularly.  Use sand or salt on walkways during winter months.  In the garage, clean up grease or oil spills. BATHROOM  Install night lights.  Install grab bars by the toilet and in the tub and shower.  Use non-skid mats or decals in the tub or shower.  Place a plastic non-slip stool in the shower to sit on, if needed.  Keep floors dry and clean up all water on the floor immediately.  Remove soap buildup in the tub or shower on a regular  basis.  Secure bath mats with non-slip, double-sided rug tape.  Remove throw rugs and tripping hazards from the floors. BEDROOMS  Install night lights.  Make sure a bedside light is easy to reach.  Do not use oversized bedding.  Keep a telephone by your bedside.  Have a firm chair with side arms to use for getting dressed.  Remove throw rugs and tripping hazards from the floor. KITCHEN  Keep handles on pots and pans turned toward the center of the stove. Use back burners when possible.  Clean up spills quickly and allow time for drying.  Avoid walking on wet floors.  Avoid hot utensils and knives.  Position shelves so they are not too high or low.  Place commonly used objects within easy reach.  If necessary, use a sturdy step stool with a grab bar when reaching.  Keep electrical cables out of the way.  Do not use floor polish or wax that makes floors slippery. If you must use wax, use non-skid floor wax.  Remove throw rugs and tripping hazards from the floor. STAIRWAYS  Never leave objects on stairs.  Place handrails on both sides of stairways and use them. Fix any loose handrails. Make sure handrails on both sides of the stairways are as long as the stairs.  Check carpeting to make sure it is firmly attached along stairs. Make repairs to worn or loose carpet promptly.  Avoid placing throw rugs at the top or bottom of stairways, or properly secure the rug with carpet tape to prevent slippage. Get rid of throw rugs, if  possible.  Have an electrician put in a light switch at the top and bottom of the stairs. OTHER FALL PREVENTION TIPS  Wear low-heel or rubber-soled shoes that are supportive and fit well. Wear closed toe shoes.  When using a stepladder, make sure it is fully opened and both spreaders are firmly locked. Do not climb a closed stepladder.  Add color or contrast paint or tape to grab bars and handrails in your home. Place contrasting color strips on  first and last steps.  Learn and use mobility aids as needed. Install an electrical emergency response system.  Turn on lights to avoid dark areas. Replace light bulbs that burn out immediately. Get light switches that glow.  Arrange furniture to create clear pathways. Keep furniture in the same place.  Firmly attach carpet with non-skid or double-sided tape.  Eliminate uneven floor surfaces.  Select a carpet pattern that does not visually hide the edge of steps.  Be aware of all pets. OTHER HOME SAFETY TIPS  Set the water temperature for 120 F (48.8 C).  Keep emergency numbers on or near the telephone.  Keep smoke detectors on every level of the home and near sleeping areas. Document Released: 05/21/2002 Document Revised: 11/30/2011 Document Reviewed: 08/20/2011 [facility] Patient Information 2015 [place], [place]. This information is not intended to replace advice given to you by your health care provider. Make sure you discuss any questions you have with your health care provider.

## 2014-08-29 NOTE — Progress Notes (Signed)
PATIENT: [name] DOB: [birth year]   REASON FOR VISIT: follow up for dementia, tremor HISTORY FROM: patient, wife  HISTORY OF PRESENT ILLNESS: 81 year old Caucasian male with long standing benign tremors which have worsened recently, mild senile dementia, remote left [place] brain stroke in 2006 and multifactorial gait difficulty.   Update 05/25/12 (JM): Returns for follow up since last visit on 11/24/11. His wife feels his memory is slightly worse. He currently continues yo drive with family as a passenger. Tolerating Aricept 10 mg well without side effects. Appetite is good. His tremors are also worse, notices more in the morning before he takes his medicines and has difficulty holding cup. Denies any problems with incontinence. He had one fall this summer without injury. His wife states "he did not pick his feet up". His wife is reporting a new problem of him having paranoid dreams. He awakens in the middle of the night disoriented and feels like someone else is present. She has noticed an increase in frequency. Yesterday he awakened with confusion and unaware of what they were supposed to be doing. He does not recall these dreams or conversations. Denies any recurrent neurovascular symptoms.   Update 01/09/13 (LL): Returns for follow up since last visit on 05/25/12. Patient feels like his memory is the same, wife feels like it is getting worse. When asked any question, he looks to his wife to answer for him. She states that he more often repeatedly asks where they are going when in the car or forgets conversations the next day. He is more often disoriented. Wife says sometimes she must repeat things several times to him, not sure if he is not understanding or not hearing her. Wife reports that he quit driving last year. She is able to leave him alone for a few hours at a time. Wife reports that he does not ask for food and if she did not tell him when it was meal time, she would not know if  he would eat. Wife reports that since starting Seroquel at last visit, he is sleeping better with no more night-time awakenings. Denies bowel or bladder problems. Tolerating Aricept well without side effects. Has had no falls at home. Wife states his tremors are worse, but are only with intention. Wife mentions lower extremity edema, worse on the left ankle, worse later in the day and gone in the morning.   UPDATE 07/10/13 (LL):  [name] returns for 6 months follow up with his wife and daughter.  They all seem to think that his memory is about the same as is his level of functioning.  He continues to sleep well on Seroquel half tablet.  He did not tolerate Namenda; had increased confusion.  He is tolerating Aricept well. He is lost 4 lbs since last visit. His appetite is good, eats 2 meals per day.   Has had no falls at home.   Tremor is the same.  No new problems. Update 08/29/2014 : He returns for follow-up after last visit 6 months ago. He has been able to tolerate Aricept 23 mg daily without significant GI or CNS side effects. However continues to have significant memory problems, difficulty understanding conversations or while watching TV shows. His sentences are short and he will try only spoken to. He will seldom initiate conversation. He also has stuffed time understanding recent information on learning new activities. Patient has been tried on Namenda in the past but did not try it due to  poor short-term memory. Patient's family is to find out from [facility] hospice*the current procedure for him being referred there. He has not started using a walker most of the time for added security and balance problems REVIEW OF SYSTEMS: Full 14 system review of systems performed and notable only for:  Runny nose, appetite change, sleep talking, nightmares, daytime sleepiness, leg swelling, walking difficulty, tremors, numbness, memory loss and confusion all other systems negative ALLERGIES: Allergies    Allergen Reactions  . Topamax [Topiramate]     HOME MEDICATIONS: Outpatient Prescriptions Prior to Visit  Medication Sig Dispense Refill  . beta carotene w/minerals (OCUVITE) tablet Take 1 tablet by mouth daily.    Marland Kitchen dipyridamole-aspirin (AGGRENOX) 200-25 MG per 12 hr capsule Take 1 capsule by mouth 2 (two) times daily.    Marland Kitchen donepezil (ARICEPT) 23 MG TABS tablet TAKE 1 TABLET BY MOUTH AT BEDTIME 90 tablet 1  . fexofenadine (ALLEGRA) 180 MG tablet Take 180 mg by mouth daily.    . fluticasone (FLONASE) 50 MCG/ACT nasal spray Place 2 sprays into the nose at bedtime.    . multivitamin-iron-minerals-folic acid (CENTRUM) chewable tablet Chew 1 tablet by mouth daily.    . propranolol ER (INDERAL LA) 60 MG 24 hr capsule Take 1 capsule (60 mg total) by mouth daily.    . QUEtiapine (SEROQUEL) 25 MG tablet TAKE ONE-HALF (1/2) TABLET BY MOUTH AT BEDTIME 45 tablet 1  . vitamin E 400 UNIT capsule Take 400 Units by mouth as directed.     No facility-administered medications prior to visit.    PAST MEDICAL HISTORY: Past Medical History  Diagnosis Date  . CVA (cerebral infarction)   . Memory loss   . Ventral hernia   . Sepsis   . Essential tremor   . Paroxysmal atrial fibrillation   . ED (erectile dysfunction)   . Arthritis     PAST SURGICAL HISTORY: Past Surgical History  Procedure Laterality Date  . Gallbladder surgery    . Hernia repair    . Perforated intes    . Colon surgery      FAMILY HISTORY: Family History  Problem Relation Age of Onset  . Aneurysm Mother     stomach  . Dementia Father   . Stroke Father     SOCIAL HISTORY: History   Social History  . Marital Status: Married    Spouse Name: [name]  . Number of Children: 2  . Years of Education: 10th   Occupational History  .     Social History Main Topics  . Smoking status: Former Smoker    Types: Cigarettes  . Smokeless tobacco: Never Used  . Alcohol Use: No  . Drug Use: No  . Sexual Activity: Not on file    Other Topics Concern  . Not on file   Social History Narrative   Patient lives at home with spouse.   Caffeine Use: 1 cup daily     PHYSICAL EXAM  Filed Vitals:   08/29/14 1354  BP: 114/63  Pulse: 55  Height: 5\' 9"  (1.753 m)  Weight: 186 lb 12.8 oz (84.732 kg)   Body mass index is 27.57 kg/(m^2).  Generalized: In no acute distress, pleasant elderly Caucasian male  Neck: Supple, no carotid bruits  Cardiac: Regular rate rhythm, no murmur, LE edema 1+ Pulmonary: Clear to auscultation bilaterally  Musculoskeletal: mild kyphoscoliosis, stooped posture   Neurological examination  Mentation: Alert oriented to person, time (not date or year). Speech and language appear normal and  fluent. MMSE 20/30 with 3 deficits in orientation to time, 1deficits in orientation to place, 0/3 recall, 1/3 in comprehension. Animal Fluency Testing 7 (12+ normal). Clock drawing 3/4. 4/5 FOR SERIAL 7's. Geriatric Depression Score 4.   (Last visit 22/30, AFT 7, Clock drawing 3/4).   Cranial nerve II-XII: Pupils were equal round reactive to light extraocular movements were full, visual field were full on confrontational test. facial sensation and strength were normal. hearing is decreased bilaterally. Uvula tongue midline. head turning and shoulder shrug and were normal and symmetric.Tongue protrusion into cheek strength was normal.  MOTOR: normal bulk and tone, full strength in the BUE, BLE, fine finger movements normal, no pronator drift. MILD POSTURAL AND ACTION TREMORS L>R. NONE AT REST. SENSORY: normal and symmetric to light touch COORDINATION: finger-nose-finger, heel-to-shin bilaterally, there was no truncal ataxia  REFLEXES: Brachioradialis 1/1, biceps 1/1, triceps 1/1, patellar 1/1, Achilles 1/1, plantar responses were flexor bilaterally. MILD POSITIVE MYERSON'S and PALMOMENTAL REFLEXES.  GAIT/STATION: Steady but slow cautious gait with diminished bilateral arm movement   DIAGNOSTIC DATA (LABS,  IMAGING, TESTING) - I reviewed patient records, labs, notes, testing and imaging myself where available.   ASSESSMENT AND PLAN 81 year old Caucasian male with long standing history of benign essential tremors, moderate dementia, remote left Pont[place] brain stroke in 2006 and multi-factorial gait difficulty. New problem last visit of night-time disorientation with delusions likely related to progressive dementia, better on Seroquel. Essential tremor on Inderal LA 60 mg daily. MMSE 20 on Aricept 23 mg daily. Memory  Worsened in last 12 months.  No recurrent neurovascular symptoms.   PLAN:  I had a long discussion with the patient, wife and daughter regarding his dementia and progressive cognitive decline. Lack of mobility of highly effective medications and treatment options. I recommend a repeat trial of Namenda since he is having significant cognitive issues and I'm not sure as to whether he got a fair trial in the past. Have discussed possible side effects with the patient and daughter and advised him to call me if needed. Continue Aricept 23 mg daily. Continue Aggrenox for stroke prevention and strict control of hypertension with blood pressure goal below 130/90. Patient has h/o  atrial fibrillation but is not for anticoagulation candidate given his gait and balance difficulties and dementia. Patient was advised for prevention precautions. Return for follow-up in 3 months or call earlier if necessary   Meds ordered this encounter  Medications  . memantine (NAMENDA TITRATION PAK) tablet pack    Sig: Take by mouth See admin instructions. 5 mg/day for =1 week; 5 mg twice daily for =1 week; 15 mg/day given in 5 mg and 10 mg separated doses for =1 week; then 10 mg twice daily    Dispense:  49 tablet    Refill:  Lawn, MD  08/29/2014, 2:42 PM [facility] [address], [place], [place] [address] [phone]  Note: This document was prepared with digital  dictation and possible smart phrase technology. Any transcriptional errors that result from this process are unintentional.

## 2014-09-11 ENCOUNTER — Other Ambulatory Visit: Payer: Self-pay | Admitting: Neurology

## 2014-11-01 ENCOUNTER — Other Ambulatory Visit: Payer: Self-pay | Admitting: Neurology

## 2014-11-04 ENCOUNTER — Telehealth: Payer: Self-pay | Admitting: Neurology

## 2014-11-04 NOTE — Telephone Encounter (Signed)
Pt's wife called stating pt is having increased sleep, confusion and when he dreams he believes it is real. She is questioning if  memantine (NAMENDA TITRATION PAK) tablet pack is too strong. Please call and advise.  Pt's wife can be reached [phone].

## 2014-11-04 NOTE — Telephone Encounter (Signed)
Spoke with wife who states that patient has increasing sleepiness, confusion that has come on "gradually since he began [place] in March". She states his current dose is 10 mg twice daily. She also states he is on other medications that cause fatigue, sleepiness: Seroquel and Propanolol. She denies that patient has developed other new problems, issues. She is questioning if his dose of Namenda or Seroquel are "too strong" and can be decreased.  Informed her Dr Leonie Man is out of office until 11/06/14, but this nurse will route her concerns/questions to him. Inquired if Ms Chuba is comfortable with this being addressed on Wed. She states she is fine with being contacted later this week on this call.

## 2014-11-06 NOTE — Telephone Encounter (Signed)
I spoke to the patient's wife and recommended she stop Seroquel at night and call back in 1-2 weeks if there is no change in the patient's sleepiness may consider reducing Namenda at that time.

## 2014-11-30 ENCOUNTER — Encounter (HOSPITAL_COMMUNITY): Admission: EM | Disposition: A | Discharge status: Skilled Nursing Facility | Payer: Self-pay | Source: Home / Self Care

## 2014-11-30 ENCOUNTER — Inpatient Hospital Stay (HOSPITAL_COMMUNITY): Payer: Medicare Other | Admitting: Anesthesiology

## 2014-11-30 ENCOUNTER — Encounter (HOSPITAL_COMMUNITY): Payer: Self-pay

## 2014-11-30 ENCOUNTER — Emergency Department (HOSPITAL_COMMUNITY): Payer: Medicare Other

## 2014-11-30 ENCOUNTER — Inpatient Hospital Stay (HOSPITAL_COMMUNITY)
Admission: EM | Admit: 2014-11-30 | Discharge: 2014-12-10 | DRG: 330 | Disposition: A | Discharge status: Skilled Nursing Facility | Payer: Medicare Other | Attending: General Surgery | Admitting: General Surgery

## 2014-11-30 DIAGNOSIS — R269 Unspecified abnormalities of gait and mobility: Secondary | ICD-10-CM | POA: Diagnosis present

## 2014-11-30 DIAGNOSIS — F329 Major depressive disorder, single episode, unspecified: Secondary | ICD-10-CM | POA: Diagnosis present

## 2014-11-30 DIAGNOSIS — K660 Peritoneal adhesions (postprocedural) (postinfection): Secondary | ICD-10-CM | POA: Diagnosis present

## 2014-11-30 DIAGNOSIS — I63412 Cerebral infarction due to embolism of left middle cerebral artery: Secondary | ICD-10-CM | POA: Diagnosis not present

## 2014-11-30 DIAGNOSIS — K5710 Diverticulosis of small intestine without perforation or abscess without bleeding: Secondary | ICD-10-CM | POA: Diagnosis not present

## 2014-11-30 DIAGNOSIS — K5900 Constipation, unspecified: Secondary | ICD-10-CM | POA: Diagnosis not present

## 2014-11-30 DIAGNOSIS — E560 Deficiency of vitamin E: Secondary | ICD-10-CM | POA: Diagnosis not present

## 2014-11-30 DIAGNOSIS — K631 Perforation of intestine (nontraumatic): Principal | ICD-10-CM | POA: Diagnosis present

## 2014-11-30 DIAGNOSIS — R251 Tremor, unspecified: Secondary | ICD-10-CM | POA: Diagnosis not present

## 2014-11-30 DIAGNOSIS — F0390 Unspecified dementia without behavioral disturbance: Secondary | ICD-10-CM | POA: Diagnosis not present

## 2014-11-30 DIAGNOSIS — Z823 Family history of stroke: Secondary | ICD-10-CM | POA: Diagnosis not present

## 2014-11-30 DIAGNOSIS — I480 Paroxysmal atrial fibrillation: Secondary | ICD-10-CM | POA: Diagnosis present

## 2014-11-30 DIAGNOSIS — G8911 Acute pain due to trauma: Secondary | ICD-10-CM | POA: Diagnosis not present

## 2014-11-30 DIAGNOSIS — Z79899 Other long term (current) drug therapy: Secondary | ICD-10-CM

## 2014-11-30 DIAGNOSIS — F05 Delirium due to known physiological condition: Secondary | ICD-10-CM | POA: Diagnosis not present

## 2014-11-30 DIAGNOSIS — Z888 Allergy status to other drugs, medicaments and biological substances status: Secondary | ICD-10-CM

## 2014-11-30 DIAGNOSIS — F22 Delusional disorders: Secondary | ICD-10-CM

## 2014-11-30 DIAGNOSIS — Z87891 Personal history of nicotine dependence: Secondary | ICD-10-CM | POA: Diagnosis not present

## 2014-11-30 DIAGNOSIS — D509 Iron deficiency anemia, unspecified: Secondary | ICD-10-CM | POA: Diagnosis not present

## 2014-11-30 DIAGNOSIS — D696 Thrombocytopenia, unspecified: Secondary | ICD-10-CM | POA: Diagnosis not present

## 2014-11-30 DIAGNOSIS — M1990 Unspecified osteoarthritis, unspecified site: Secondary | ICD-10-CM | POA: Diagnosis not present

## 2014-11-30 DIAGNOSIS — I471 Supraventricular tachycardia: Secondary | ICD-10-CM | POA: Diagnosis not present

## 2014-11-30 DIAGNOSIS — I482 Chronic atrial fibrillation: Secondary | ICD-10-CM | POA: Diagnosis not present

## 2014-11-30 DIAGNOSIS — I6521 Occlusion and stenosis of right carotid artery: Secondary | ICD-10-CM | POA: Diagnosis present

## 2014-11-30 DIAGNOSIS — Z9049 Acquired absence of other specified parts of digestive tract: Secondary | ICD-10-CM | POA: Diagnosis present

## 2014-11-30 DIAGNOSIS — K567 Ileus, unspecified: Secondary | ICD-10-CM | POA: Diagnosis not present

## 2014-11-30 DIAGNOSIS — G250 Essential tremor: Secondary | ICD-10-CM | POA: Diagnosis present

## 2014-11-30 DIAGNOSIS — R1030 Lower abdominal pain, unspecified: Secondary | ICD-10-CM

## 2014-11-30 DIAGNOSIS — I10 Essential (primary) hypertension: Secondary | ICD-10-CM | POA: Diagnosis present

## 2014-11-30 DIAGNOSIS — D62 Acute posthemorrhagic anemia: Secondary | ICD-10-CM | POA: Diagnosis present

## 2014-11-30 DIAGNOSIS — Z7901 Long term (current) use of anticoagulants: Secondary | ICD-10-CM | POA: Diagnosis not present

## 2014-11-30 DIAGNOSIS — Z820 Family history of epilepsy and other diseases of the nervous system: Secondary | ICD-10-CM

## 2014-11-30 DIAGNOSIS — N179 Acute kidney failure, unspecified: Secondary | ICD-10-CM | POA: Diagnosis not present

## 2014-11-30 DIAGNOSIS — E860 Dehydration: Secondary | ICD-10-CM | POA: Diagnosis present

## 2014-11-30 DIAGNOSIS — N2889 Other specified disorders of kidney and ureter: Secondary | ICD-10-CM | POA: Diagnosis present

## 2014-11-30 DIAGNOSIS — K6389 Other specified diseases of intestine: Secondary | ICD-10-CM | POA: Diagnosis not present

## 2014-11-30 DIAGNOSIS — M6281 Muscle weakness (generalized): Secondary | ICD-10-CM | POA: Diagnosis not present

## 2014-11-30 DIAGNOSIS — R278 Other lack of coordination: Secondary | ICD-10-CM | POA: Diagnosis not present

## 2014-11-30 DIAGNOSIS — F0391 Unspecified dementia with behavioral disturbance: Secondary | ICD-10-CM | POA: Diagnosis not present

## 2014-11-30 DIAGNOSIS — N289 Disorder of kidney and ureter, unspecified: Secondary | ICD-10-CM | POA: Diagnosis not present

## 2014-11-30 DIAGNOSIS — T7840XA Allergy, unspecified, initial encounter: Secondary | ICD-10-CM | POA: Diagnosis not present

## 2014-11-30 DIAGNOSIS — Z8673 Personal history of transient ischemic attack (TIA), and cerebral infarction without residual deficits: Secondary | ICD-10-CM

## 2014-11-30 DIAGNOSIS — Z48815 Encounter for surgical aftercare following surgery on the digestive system: Secondary | ICD-10-CM | POA: Diagnosis not present

## 2014-11-30 DIAGNOSIS — R1084 Generalized abdominal pain: Secondary | ICD-10-CM | POA: Diagnosis not present

## 2014-11-30 DIAGNOSIS — K5700 Diverticulitis of small intestine with perforation and abscess without bleeding: Secondary | ICD-10-CM | POA: Diagnosis not present

## 2014-11-30 DIAGNOSIS — F0392 Unspecified dementia, unspecified severity, with psychotic disturbance: Secondary | ICD-10-CM | POA: Diagnosis present

## 2014-11-30 HISTORY — PX: LAPAROSCOPIC LYSIS OF ADHESIONS: SHX5905

## 2014-11-30 HISTORY — PX: BOWEL RESECTION: SHX1257

## 2014-11-30 LAB — URINALYSIS, ROUTINE W REFLEX MICROSCOPIC
Bilirubin Urine: NEGATIVE
GLUCOSE, UA: NEGATIVE mg/dL
Hgb urine dipstick: NEGATIVE
Ketones, ur: NEGATIVE mg/dL
Leukocytes, UA: NEGATIVE
Nitrite: NEGATIVE
PH: 7.5 (ref 5.0–8.0)
Protein, ur: NEGATIVE mg/dL
SPECIFIC GRAVITY, URINE: 1.019 (ref 1.005–1.030)
Urobilinogen, UA: 1 mg/dL (ref 0.0–1.0)

## 2014-11-30 LAB — COMPREHENSIVE METABOLIC PANEL
ALT: 14 U/L — ABNORMAL LOW (ref 17–63)
ANION GAP: 5 (ref 5–15)
AST: 25 U/L (ref 15–41)
Albumin: 3.7 g/dL (ref 3.5–5.0)
Alkaline Phosphatase: 52 U/L (ref 38–126)
BUN: 22 mg/dL — AB (ref 6–20)
CO2: 25 mmol/L (ref 22–32)
Calcium: 8.5 mg/dL — ABNORMAL LOW (ref 8.9–10.3)
Chloride: 109 mmol/L (ref 101–111)
Creatinine, Ser: 1.69 mg/dL — ABNORMAL HIGH (ref 0.61–1.24)
GFR calc Af Amer: 39 mL/min — ABNORMAL LOW (ref >60)
GFR, EST NON AFRICAN AMERICAN: 34 mL/min — AB (ref >60)
GLUCOSE: 108 mg/dL — AB (ref 65–99)
Potassium: 4.7 mmol/L (ref 3.5–5.1)
Sodium: 139 mmol/L (ref 135–145)
TOTAL PROTEIN: 6.7 g/dL (ref 6.5–8.1)
Total Bilirubin: 1.2 mg/dL (ref 0.3–1.2)

## 2014-11-30 LAB — I-STAT CG4 LACTIC ACID, ED
Lactic Acid, Venous: 1.43 mmol/L (ref 0.5–2.0)
Lactic Acid, Venous: 1.32 mmol/L (ref 0.5–2.0)

## 2014-11-30 LAB — CBC WITH DIFFERENTIAL/PLATELET
BASOS ABS: 0 10*3/uL (ref 0.0–0.1)
BASOS PCT: 0 % (ref 0–1)
Eosinophils Absolute: 0 10*3/uL (ref 0.0–0.7)
Eosinophils Relative: 0 % (ref 0–5)
HEMATOCRIT: 38 % — AB (ref 39.0–52.0)
Hemoglobin: 12.3 g/dL — ABNORMAL LOW (ref 13.0–17.0)
Lymphocytes Relative: 13 % (ref 12–46)
Lymphs Abs: 1.3 10*3/uL (ref 0.7–4.0)
MCH: 32 pg (ref 26.0–34.0)
MCHC: 32.4 g/dL (ref 30.0–36.0)
MCV: 99 fL (ref 78.0–100.0)
MONO ABS: 0.6 10*3/uL (ref 0.1–1.0)
Monocytes Relative: 6 % (ref 3–12)
NEUTROS ABS: 8.1 10*3/uL — AB (ref 1.7–7.7)
NEUTROS PCT: 81 % — AB (ref 43–77)
PLATELETS: 147 10*3/uL — AB (ref 150–400)
RBC: 3.84 MIL/uL — ABNORMAL LOW (ref 4.22–5.81)
RDW: 12.7 % (ref 11.5–15.5)
WBC: 10 10*3/uL (ref 4.0–10.5)

## 2014-11-30 LAB — LIPASE, BLOOD: Lipase: 22 U/L (ref 22–51)

## 2014-11-30 SURGERY — LYSIS, ADHESIONS, LAPAROSCOPIC
Anesthesia: General

## 2014-11-30 MED ORDER — GLYCOPYRROLATE 0.2 MG/ML IJ SOLN
INTRAMUSCULAR | Status: AC
  Filled 2014-11-30: qty 3

## 2014-11-30 MED ORDER — PROMETHAZINE HCL 25 MG/ML IJ SOLN: 6.2500 mg | INTRAMUSCULAR | Status: DC | PRN

## 2014-11-30 MED ORDER — LIDOCAINE HCL (CARDIAC) 20 MG/ML IV SOLN
INTRAVENOUS | Status: DC | PRN
  Administered 2014-11-30: 50 mg via INTRAVENOUS

## 2014-11-30 MED ORDER — EPHEDRINE SULFATE 50 MG/ML IJ SOLN
INTRAMUSCULAR | Status: DC | PRN
  Administered 2014-11-30: 10 mg via INTRAVENOUS

## 2014-11-30 MED ORDER — 0.9 % SODIUM CHLORIDE (POUR BTL) OPTIME
TOPICAL | Status: DC | PRN
  Administered 2014-11-30: 2000 mL

## 2014-11-30 MED ORDER — LACTATED RINGERS IV SOLN
INTRAVENOUS | Status: DC | PRN
  Administered 2014-11-30 (×2): via INTRAVENOUS

## 2014-11-30 MED ORDER — LACTATED RINGERS IR SOLN
Status: DC | PRN
  Administered 2014-11-30: 1000 mL

## 2014-11-30 MED ORDER — LACTATED RINGERS IV SOLN
INTRAVENOUS | Status: DC | PRN
  Administered 2014-11-30 (×2): via INTRAVENOUS

## 2014-11-30 MED ORDER — PROPOFOL 10 MG/ML IV BOLUS
INTRAVENOUS | Status: AC
  Filled 2014-11-30: qty 20

## 2014-11-30 MED ORDER — BUPIVACAINE-EPINEPHRINE 0.25% -1:200000 IJ SOLN
INTRAMUSCULAR | Status: DC | PRN
  Administered 2014-11-30: 30 mL

## 2014-11-30 MED ORDER — CETYLPYRIDINIUM CHLORIDE 0.05 % MT LIQD
7.0000 mL | Freq: Two times a day (BID) | OROMUCOSAL | Status: DC
Start: 2014-12-01 — End: 2014-12-10
  Administered 2014-12-01 – 2014-12-09 (×14): 7 mL via OROMUCOSAL

## 2014-11-30 MED ORDER — GLYCOPYRROLATE 0.2 MG/ML IJ SOLN
INTRAMUSCULAR | Status: DC | PRN
  Administered 2014-11-30: .6 mg via INTRAVENOUS

## 2014-11-30 MED ORDER — MIDAZOLAM HCL 5 MG/5ML IJ SOLN
INTRAMUSCULAR | Status: DC | PRN
  Administered 2014-11-30: 2 mg via INTRAVENOUS

## 2014-11-30 MED ORDER — IOHEXOL 300 MG/ML  SOLN
50.0000 mL | Freq: Once | INTRAMUSCULAR | Status: AC | PRN
  Administered 2014-11-30: 50 mL via ORAL

## 2014-11-30 MED ORDER — ROCURONIUM BROMIDE 100 MG/10ML IV SOLN
INTRAVENOUS | Status: AC
  Filled 2014-11-30: qty 1

## 2014-11-30 MED ORDER — FENTANYL CITRATE (PF) 100 MCG/2ML IJ SOLN
INTRAMUSCULAR | Status: DC | PRN
  Administered 2014-11-30: 50 ug via INTRAVENOUS
  Administered 2014-11-30 (×3): 25 ug via INTRAVENOUS
  Administered 2014-11-30: 50 ug via INTRAVENOUS
  Administered 2014-11-30: 100 ug via INTRAVENOUS
  Administered 2014-11-30: 25 ug via INTRAVENOUS
  Administered 2014-11-30: 50 ug via INTRAVENOUS

## 2014-11-30 MED ORDER — FENTANYL CITRATE (PF) 100 MCG/2ML IJ SOLN
INTRAMUSCULAR | Status: AC
  Filled 2014-11-30: qty 2

## 2014-11-30 MED ORDER — FENTANYL CITRATE (PF) 100 MCG/2ML IJ SOLN: 25.0000 ug | INTRAMUSCULAR | Status: DC | PRN

## 2014-11-30 MED ORDER — LACTATED RINGERS IV SOLN: INTRAVENOUS | Status: DC | PRN

## 2014-11-30 MED ORDER — NEOSTIGMINE METHYLSULFATE 10 MG/10ML IV SOLN
INTRAVENOUS | Status: DC | PRN
  Administered 2014-11-30: 4 mg via INTRAVENOUS

## 2014-11-30 MED ORDER — MEPERIDINE HCL 50 MG/ML IJ SOLN: 6.2500 mg | INTRAMUSCULAR | Status: DC | PRN

## 2014-11-30 MED ORDER — IOHEXOL 300 MG/ML  SOLN
100.0000 mL | Freq: Once | INTRAMUSCULAR | Status: AC | PRN
  Administered 2014-11-30: 80 mL via INTRAVENOUS

## 2014-11-30 MED ORDER — LIDOCAINE HCL (CARDIAC) 20 MG/ML IV SOLN
INTRAVENOUS | Status: AC
  Filled 2014-11-30: qty 5

## 2014-11-30 MED ORDER — ONDANSETRON HCL 4 MG/2ML IJ SOLN
INTRAMUSCULAR | Status: AC
  Filled 2014-11-30: qty 2

## 2014-11-30 MED ORDER — METOPROLOL TARTRATE 1 MG/ML IV SOLN: 5.0000 mg | Freq: Three times a day (TID) | INTRAVENOUS | Status: DC

## 2014-11-30 MED ORDER — ONDANSETRON HCL 4 MG/2ML IJ SOLN
INTRAMUSCULAR | Status: AC
Start: 2014-11-30 — End: 2014-11-30
  Filled 2014-11-30: qty 2

## 2014-11-30 MED ORDER — PROPOFOL 10 MG/ML IV BOLUS
INTRAVENOUS | Status: DC | PRN
  Administered 2014-11-30: 120 mg via INTRAVENOUS

## 2014-11-30 MED ORDER — PIPERACILLIN-TAZOBACTAM 3.375 G IVPB
3.3750 g | Freq: Three times a day (TID) | INTRAVENOUS | Status: DC
  Administered 2014-12-01 – 2014-12-07 (×20): 3.375 g via INTRAVENOUS
  Filled 2014-11-30 (×20): qty 50

## 2014-11-30 MED ORDER — FENTANYL CITRATE (PF) 250 MCG/5ML IJ SOLN
INTRAMUSCULAR | Status: AC
  Filled 2014-11-30: qty 5

## 2014-11-30 MED ORDER — CHLORHEXIDINE GLUCONATE 0.12 % MT SOLN
15.0000 mL | Freq: Two times a day (BID) | OROMUCOSAL | Status: DC
  Administered 2014-11-30 – 2014-12-10 (×18): 15 mL via OROMUCOSAL
  Filled 2014-11-30 (×17): qty 15

## 2014-11-30 MED ORDER — CISATRACURIUM BESYLATE 20 MG/10ML IV SOLN
INTRAVENOUS | Status: AC
  Filled 2014-11-30: qty 10

## 2014-11-30 MED ORDER — ONDANSETRON HCL 4 MG/2ML IJ SOLN
INTRAMUSCULAR | Status: DC | PRN
  Administered 2014-11-30: 4 mg via INTRAVENOUS

## 2014-11-30 MED ORDER — MORPHINE SULFATE 4 MG/ML IJ SOLN
4.0000 mg | Freq: Once | INTRAMUSCULAR | Status: AC
  Administered 2014-11-30: 4 mg via INTRAVENOUS
  Filled 2014-11-30: qty 1

## 2014-11-30 MED ORDER — NEOSTIGMINE METHYLSULFATE 10 MG/10ML IV SOLN
INTRAVENOUS | Status: AC
  Filled 2014-11-30: qty 1

## 2014-11-30 MED ORDER — HEPARIN SODIUM (PORCINE) 5000 UNIT/ML IJ SOLN
5000.0000 [IU] | Freq: Three times a day (TID) | INTRAMUSCULAR | Status: DC
  Administered 2014-12-01 – 2014-12-10 (×28): 5000 [IU] via SUBCUTANEOUS
  Filled 2014-11-30 (×26): qty 1

## 2014-11-30 MED ORDER — SODIUM CHLORIDE 0.9 % IV SOLN
INTRAVENOUS | Status: DC
  Administered 2014-11-30: 17:00:00 via INTRAVENOUS

## 2014-11-30 MED ORDER — BUPIVACAINE HCL (PF) 0.25 % IJ SOLN
INTRAMUSCULAR | Status: AC
  Filled 2014-11-30: qty 30

## 2014-11-30 MED ORDER — MORPHINE SULFATE 2 MG/ML IJ SOLN
1.0000 mg | INTRAMUSCULAR | Status: DC | PRN
  Administered 2014-11-30: 1 mg via INTRAVENOUS
  Administered 2014-12-01 (×3): 2 mg via INTRAVENOUS
  Administered 2014-12-02: 1 mg via INTRAVENOUS
  Administered 2014-12-02 – 2014-12-03 (×6): 2 mg via INTRAVENOUS
  Administered 2014-12-04: 3 mg via INTRAVENOUS
  Administered 2014-12-04 – 2014-12-08 (×4): 2 mg via INTRAVENOUS
  Filled 2014-11-30 (×5): qty 1
  Filled 2014-11-30: qty 2
  Filled 2014-11-30 (×10): qty 1

## 2014-11-30 MED ORDER — PIPERACILLIN-TAZOBACTAM 3.375 G IVPB 30 MIN
3.3750 g | Freq: Once | INTRAVENOUS | Status: AC
  Administered 2014-11-30: 3.375 g via INTRAVENOUS
  Filled 2014-11-30: qty 50

## 2014-11-30 MED ORDER — KCL IN DEXTROSE-NACL 20-5-0.45 MEQ/L-%-% IV SOLN
INTRAVENOUS | Status: DC
  Administered 2014-11-30: via INTRAVENOUS
  Administered 2014-12-01: 125 mL/h via INTRAVENOUS
  Administered 2014-12-01: 100 mL/h via INTRAVENOUS
  Administered 2014-12-02: 03:00:00 via INTRAVENOUS
  Administered 2014-12-02 – 2014-12-04 (×3): 1000 mL via INTRAVENOUS
  Administered 2014-12-04 – 2014-12-06 (×4): via INTRAVENOUS
  Administered 2014-12-06: 100 mL/h via INTRAVENOUS
  Administered 2014-12-07 – 2014-12-10 (×5): via INTRAVENOUS
  Filled 2014-11-30 (×24): qty 1000

## 2014-11-30 MED ORDER — ONDANSETRON HCL 4 MG PO TABS: 4.0000 mg | ORAL_TABLET | Freq: Four times a day (QID) | ORAL | Status: DC | PRN

## 2014-11-30 MED ORDER — ROCURONIUM BROMIDE 100 MG/10ML IV SOLN
INTRAVENOUS | Status: DC | PRN
  Administered 2014-11-30: 5 mg via INTRAVENOUS
  Administered 2014-11-30: 10 mg via INTRAVENOUS
  Administered 2014-11-30: 5 mg via INTRAVENOUS
  Administered 2014-11-30: 10 mg via INTRAVENOUS
  Administered 2014-11-30: 40 mg via INTRAVENOUS
  Administered 2014-11-30: 5 mg via INTRAVENOUS

## 2014-11-30 MED ORDER — ONDANSETRON HCL 4 MG/2ML IJ SOLN
4.0000 mg | Freq: Four times a day (QID) | INTRAMUSCULAR | Status: DC | PRN
  Administered 2014-12-02 – 2014-12-03 (×2): 4 mg via INTRAVENOUS
  Filled 2014-11-30 (×2): qty 2

## 2014-11-30 SURGICAL SUPPLY — 56 items
ANCHOR TIS RET SYS 200 (MISCELLANEOUS) IMPLANT
APPLIER CLIP ROT 10 11.4 M/L (STAPLE)
BLADE EXTENDED COATED 6.5IN (ELECTRODE) IMPLANT
BNDG GAUZE ELAST 4 BULKY (GAUZE/BANDAGES/DRESSINGS) ×3 IMPLANT
CELLS DAT CNTRL 66122 CELL SVR (MISCELLANEOUS) ×1 IMPLANT
CLAMP ENDO BABCK 10MM (STAPLE) IMPLANT
CLIP APPLIE ROT 10 11.4 M/L (STAPLE) IMPLANT
CONNECTOR 5 IN 1 STRAIGHT STRL (MISCELLANEOUS) ×3 IMPLANT
COVER SURGICAL LIGHT HANDLE (MISCELLANEOUS) ×3 IMPLANT
DECANTER SPIKE VIAL GLASS SM (MISCELLANEOUS) ×3 IMPLANT
DISSECTOR BLUNT TIP ENDO 5MM (MISCELLANEOUS) ×3 IMPLANT
DRAPE LAPAROSCOPIC ABDOMINAL (DRAPES) ×3 IMPLANT
DRAPE WARM FLUID 44X44 (DRAPE) ×3 IMPLANT
FILTER SMOKE EVAC LAPAROSHD (FILTER) ×3 IMPLANT
GLOVE BIOGEL PI IND STRL 7.0 (GLOVE) ×1 IMPLANT
GLOVE BIOGEL PI INDICATOR 7.0 (GLOVE) ×2
GOWN SPEC L4 XLG W/TWL (GOWN DISPOSABLE) ×3 IMPLANT
GOWN STRL REUS W/ TWL XL LVL3 (GOWN DISPOSABLE) ×3 IMPLANT
GOWN STRL REUS W/TWL LRG LVL3 (GOWN DISPOSABLE) ×3 IMPLANT
GOWN STRL REUS W/TWL XL LVL3 (GOWN DISPOSABLE) ×6
HANDLE STAPLE EGIA 4 XL (STAPLE) IMPLANT
KIT BASIN OR (CUSTOM PROCEDURE TRAY) ×3 IMPLANT
LIGASURE IMPACT 36 18CM CVD LR (INSTRUMENTS) ×3 IMPLANT
NS IRRIG 1000ML POUR BTL (IV SOLUTION) ×3 IMPLANT
PAD ABD 8X10 STRL (GAUZE/BANDAGES/DRESSINGS) ×3 IMPLANT
PENCIL BUTTON HOLSTER BLD 10FT (ELECTRODE) ×3 IMPLANT
POUCH ENDO CATCH II 15MM (MISCELLANEOUS) IMPLANT
RTRCTR WOUND ALEXIS 18CM MED (MISCELLANEOUS) ×3
SCISSORS LAP 5X35 DISP (ENDOMECHANICALS) ×3 IMPLANT
SET IRRIG TUBING LAPAROSCOPIC (IRRIGATION / IRRIGATOR) ×3 IMPLANT
SHEARS HARMONIC ACE PLUS 36CM (ENDOMECHANICALS) ×3 IMPLANT
SLEEVE Z-THREAD 5X100MM (TROCAR) IMPLANT
SOLUTION ANTI FOG 6CC (MISCELLANEOUS) ×3 IMPLANT
SPONGE LAP 18X18 X RAY DECT (DISPOSABLE) ×3 IMPLANT
STAPLER GUN LINEAR PROX 60 (STAPLE) ×3 IMPLANT
STAPLER PROXIMATE 75MM BLUE (STAPLE) ×3 IMPLANT
STAPLER VISISTAT 35W (STAPLE) ×3 IMPLANT
SUT NOVA NAB DX-16 0-1 5-0 T12 (SUTURE) ×3 IMPLANT
SUT SILK 3 0 SH CR/8 (SUTURE) ×3 IMPLANT
SUT VIC AB 4-0 SH 18 (SUTURE) ×3 IMPLANT
SYR 30ML LL (SYRINGE) ×3 IMPLANT
SYS LAPSCP GELPORT 120MM (MISCELLANEOUS)
SYSTEM LAPSCP GELPORT 120MM (MISCELLANEOUS) IMPLANT
TAPE CLOTH 4X10 WHT NS (GAUZE/BANDAGES/DRESSINGS) IMPLANT
TAPE CLOTH SURG 4X10 WHT LF (GAUZE/BANDAGES/DRESSINGS) ×3 IMPLANT
TOWEL OR 17X26 10 PK STRL BLUE (TOWEL DISPOSABLE) ×3 IMPLANT
TRAY FOLEY W/METER SILVER 14FR (SET/KITS/TRAYS/PACK) ×3 IMPLANT
TRAY LAPAROSCOPIC (CUSTOM PROCEDURE TRAY) ×3 IMPLANT
TROCAR ADV FIXATION 5X100MM (TROCAR) ×6 IMPLANT
TROCAR BLADELESS OPT 5 100 (ENDOMECHANICALS) ×3 IMPLANT
TROCAR BLADELESS OPT 5 75 (ENDOMECHANICALS) IMPLANT
TROCAR XCEL NON-BLD 11X100MML (ENDOMECHANICALS) IMPLANT
TROCAR XCEL UNIV SLVE 11M 100M (ENDOMECHANICALS) IMPLANT
TUBING FILTER THERMOFLATOR (ELECTROSURGICAL) ×3 IMPLANT
WATER STERILE IRR 1500ML POUR (IV SOLUTION) ×3 IMPLANT
YANKAUER SUCT BULB TIP 10FT TU (MISCELLANEOUS) ×6 IMPLANT

## 2014-11-30 NOTE — ED Notes (Signed)
Patient transported to CT 

## 2014-11-30 NOTE — Anesthesia Procedure Notes (Addendum)
Procedure Name: Intubation Date/Time: 11/30/2014 6:18 PM Performed by: Anne Fu Pre-anesthesia Checklist: Patient identified, Emergency Drugs available, Suction available, Patient being monitored and Timeout performed Patient Re-evaluated:Patient Re-evaluated prior to inductionOxygen Delivery Method: Circle system utilized Intubation Type: Rapid sequence and IV induction Ventilation: Mask ventilation without difficulty Laryngoscope Size: Mac and 4 Grade View: Grade I Tube type: Oral Tube size: 7.5 mm Number of attempts: 1 Airway Equipment and Method: Stylet Placement Confirmation: ETT inserted through vocal cords under direct vision,  positive ETCO2,  CO2 detector and breath sounds checked- equal and bilateral Secured at: 20 cm Tube secured with: Tape Dental Injury: Teeth and Oropharynx as per pre-operative assessment

## 2014-11-30 NOTE — ED Provider Notes (Signed)
CSN: [id number]     Arrival date & time 11/30/14  1123 History   First MD Initiated Contact with Patient 11/30/14 1128     Chief Complaint  Patient presents with  . Abdominal Pain     (Consider location/radiation/quality/duration/timing/severity/associated sxs/prior Treatment) Patient is a 81 y.o. male presenting with abdominal pain. The history is provided by the patient and medical records.  Abdominal Pain  This is a 81 year old male with past medical history significant for prior stroke, essential tremor, paroxysmal A. fib, arthritis, dementia, presenting to the ED for abdominal pain. Pain was sudden onset this morning around 0700.  Pain localized throughout his entire lower abdomen, described as sharp and stabbing. There is no radiation of pain. No nausea, vomiting, or diarrhea. Pain is significantly worse with movement. Prior surgeries include cholecystectomy, hernia repair with mesh, partial colonic resection secondary to bowel perforation. Patient states his symptoms currently are similar to when his bowel perforated in the past. He denies any fever or chills. No urinary symptoms. Patient saw currently on anticoagulation. He has not been able to eat or drink thus far today.  Past Medical History  Diagnosis Date  . CVA (cerebral infarction)   . Memory loss   . Ventral hernia   . Sepsis   . Essential tremor   . Paroxysmal atrial fibrillation   . ED (erectile dysfunction)   . Arthritis    Past Surgical History  Procedure Laterality Date  . Gallbladder surgery    . Hernia repair    . Perforated intes    . Colon surgery     Family History  Problem Relation Age of Onset  . Aneurysm Mother     stomach  . Dementia Father   . Stroke Father    History  Substance Use Topics  . Smoking status: Former Smoker    Types: Cigarettes  . Smokeless tobacco: Never Used  . Alcohol Use: No    Review of Systems  Gastrointestinal: Positive for abdominal pain.  All other systems  reviewed and are negative.     Allergies  Topamax  Home Medications   Prior to Admission medications   Medication Sig Start Date End Date Taking? Authorizing Provider  beta carotene w/minerals (OCUVITE) tablet Take 1 tablet by mouth daily.    Historical Provider, MD  dipyridamole-aspirin (AGGRENOX) 200-25 MG per 12 hr capsule Take 1 capsule by mouth 2 (two) times daily.    Historical Provider, MD  donepezil (ARICEPT) 23 MG TABS tablet TAKE 1 TABLET BY MOUTH AT BEDTIME 11/03/14   Garvin Fila, MD  fexofenadine (ALLEGRA) 180 MG tablet Take 180 mg by mouth daily.    Historical Provider, MD  fluticasone (FLONASE) 50 MCG/ACT nasal spray Place 2 sprays into the nose at bedtime. 01/01/13   Historical Provider, MD  memantine (NAMENDA) 10 MG tablet Take 10 mg by mouth 2 (two) times daily. 10/22/14   Historical Provider, MD  multivitamin-iron-minerals-folic acid (CENTRUM) chewable tablet Chew 1 tablet by mouth daily.    Historical Provider, MD  propranolol ER (INDERAL LA) 60 MG 24 hr capsule Take 1 capsule (60 mg total) by mouth daily. 02/19/13   Philmore Pali, NP  QUEtiapine (SEROQUEL) 25 MG tablet TAKE ONE-HALF (1/2) TABLET BY MOUTH AT BEDTIME 09/11/14   Garvin Fila, MD  vitamin E 400 UNIT capsule Take 400 Units by mouth as directed.    Historical Provider, MD   BP 123/47 mmHg  Pulse 59  Temp(Src) 99.8 F (37.7 C) (Oral)  Resp 15  SpO2 94%   Physical Exam  Constitutional: He is oriented to person, place, and time. He appears well-developed and well-nourished.  HENT:  Head: Normocephalic and atraumatic.  Mouth/Throat: Oropharynx is clear and moist.  Eyes: Conjunctivae and EOM are normal. Pupils are equal, round, and reactive to light.  Neck: Normal range of motion.  Cardiovascular: Normal rate, regular rhythm and normal heart sounds.   Pulmonary/Chest: Effort normal and breath sounds normal.  Abdominal: Soft. Bowel sounds are normal. He exhibits distension. There is tenderness in the right  lower quadrant and left lower quadrant. There is guarding.  Abdomen soft, appears mildly distended, tenderness of right and left lower quadrants with voluntary guarding  Musculoskeletal: Normal range of motion.  Neurological: He is alert and oriented to person, place, and time. He displays tremor.  Essential tremor (baseline)  Skin: Skin is warm and dry.  Psychiatric: He has a normal mood and affect.  Nursing note and vitals reviewed.   ED Course  Procedures (including critical care time) Labs Review Labs Reviewed  CBC WITH DIFFERENTIAL/PLATELET - Abnormal; Notable for the following:    RBC 3.84 (*)    Hemoglobin 12.3 (*)    HCT 38.0 (*)    Platelets 147 (*)    Neutrophils Relative % 81 (*)    Neutro Abs 8.1 (*)    All other components within normal limits  COMPREHENSIVE METABOLIC PANEL - Abnormal; Notable for the following:    Glucose, Bld 108 (*)    BUN 22 (*)    Creatinine, Ser 1.69 (*)    Calcium 8.5 (*)    ALT 14 (*)    GFR calc non Af Amer 34 (*)    GFR calc Af Amer 39 (*)    All other components within normal limits  LIPASE, BLOOD  URINALYSIS, ROUTINE W REFLEX MICROSCOPIC (NOT AT [facility])  I-STAT CG4 LACTIC ACID, ED  I-STAT CG4 LACTIC ACID, ED    Imaging Review Ct Abdomen Pelvis W Contrast  11/30/2014   CLINICAL DATA:  Sudden onset of the lower abdominal pain this morning.  EXAM: CT ABDOMEN AND PELVIS WITH CONTRAST  TECHNIQUE: Multidetector CT imaging of the abdomen and pelvis was performed using the standard protocol following bolus administration of intravenous contrast.  CONTRAST:  30mL OMNIPAQUE IOHEXOL 300 MG/ML SOLN, 41mL OMNIPAQUE IOHEXOL 300 MG/ML SOLN  COMPARISON:  03/11/2008  FINDINGS: There is laxity of the anterior abdominal wall after hernia mesh repair. This has not appreciably changed.  Postcholecystectomy.  5 mm hypodensity in the right lobe of the liver on image 22.  Spleen, pancreas, and adrenal glands are within normal limits.  Several low-density lesions  are seen in both kidneys. There is 1 exophytic lesion measuring 1.4 cm in the left kidney with elevated Hounsfield unit measurements that has increased in size and today measures 1.4 cm. See image 29 of series 2.  Large duodenal diverticulum on image 26 is stable.  There are several small loculation of free intraperitoneal gas adjacent to left anterior small bowel loops on images 39 through 46. There is extensive stranding in the mesenteric fat. Tiny hyperdense foci within the mesenteric fat on image 42 are noted. They may represent tiny loculations of extraluminal contrast are possibly calcifications.  Extensive diverticulosis of the sigmoid colon is present. There is subtle stranding within the fat adjacent to the sigmoid colon. This is probably non significant and may be related to the adjacent inflammatory process.  Bladder is decompressed.  Left inguinal  hernia contains adipose tissue.  This is stable.  Multilevel degenerative disc disease in the lower thoracic and lumbar spine. No vertebral compression deformity.  IMPRESSION: There is free intraperitoneal gas and fatty inflammatory changes adjacent to small bowel loops compatible with a small bowel inflammatory process and rupture. There is no adjacent abscess. Critical Value/emergent results were called by telephone at the time of interpretation on 11/30/2014 at 2:17 pm to Dr. Verneda Skill, who verbally acknowledged these results.  Enlarging hyperdense lesion in the left kidney worrisome for neoplasm. MRI with contrast is recommended when feasible.   Electronically Signed   By: Marybelle Killings M.D.   On: 11/30/2014 14:17     EKG Interpretation None      MDM   Final diagnoses:  Lower abdominal pain  Bowel perforation  Acute kidney injury   81 year old male here with sudden onset abdominal pain this morning. No nausea, vomiting, or diarrhea. Patient has history of bowel perforation in the past, states symptoms today feel similar. He is afebrile,  nontoxic. He has tenderness of his lower abdomen with voluntary guarding consistent with surgical abdomen. His vital signs are stable on room air. Labwork as above, serum creatinine elevated from baseline consistent with acute kidney injury is likely secondary to dehydration. Patient was given IV fluids. CT scan obtained confirming small bowel perforation. Case was discussed with general surgery, Dr. Lucia Gaskins, will plan to take to the OR today.  Will admit to medicine service, Dr. Sloan Leiter, for further management.  Temp admit orders placed.  Larene Pickett, PA-C 11/30/14 1459  Milton Ferguson, MD 12/03/14 [phone]

## 2014-11-30 NOTE — Consult Note (Addendum)
Re:   [name] DOB:   1921/01/19 MRN:   [id number]  ASSESSMENT AND PLAN: 1.  Probable perforation small bowel  Appears to have perforation of small bowel to the left of midline near the umbilicus.  Discussed with the wife, [name], and son, [name], about antibiotics vs surgery.  But I think, if anything is going to be done, surgery gives him his best chance of surviving.  Because of his age and underlying dementia, he is at significant risk for complications post op.  They have a living will in place.  The discussion was with the wife and son, the patient really did not participated in the discussion of surgery.  2.  Laparoscopic converted to open right hemicolectomy - 09/12/2008 - [facility] 3.  History of contained perforation of jejunal diverticula - ex lap with SB resection - 04/05/2007 - [facility] 4.  History of ventral hernia repair with polyester mesh (Parietex), upper abdomen - 2005 - [facility]  5.  Tremors 6.  Moderate senile dementia  He sees Dr. Royal Hawthorn.  Placed on [place] in 08/2014   7.  Remote left [place] brain stroke - 2006 8.  Remote history of Parox Supraven tachycardias   Saw Dr. Tamala Julian 9.  Left kidney mass seen on CT scan.  Chief Complaint  Patient presents with  . Abdominal Pain   REFERRING PHYSICIAN: Simona Huh, MD  HISTORY OF PRESENT ILLNESS: [name] is a 81 y.o. (DOB: 04/22/1921)  white  male whose primary care physician is Simona Huh, MD and comes to the [facility] ER for abdominal pain.  The patient lives with his wife at home.  He has moderate dementia.  He did not participate in his history or discussion of his care.  His wife said that he was not active yesterday, but did not complain of pain.  His last BM was about 2 days ago. He awoke this morning complaiing of stomach pain.  His wife said that he almost passed out. His wife said that he walks with a walker, but cannot walk very far. He was brought tot he [facility] for evaluation.  He has had multiple  abdominal operations:  Laparoscopic converted to open right hemicolectomy - 09/12/2008 - [facility]  History of contained perforation of jejunal diverticula - ex lap with SB resection - 04/05/2007 - [facility]  History of ventral hernia repair with mesh, upper abdomen - 2005 - [facility]  Open cholecystectomy - 02/04/2003 -[facility].   CT scan of abdomen - 11/30/2014 - There is free intraperitoneal gas and fatty inflammatory changes adjacent to small bowel loops compatible with a small bowel inflammatory process and rupture. There is no adjacent abscess. Critical Value/emergent results were called by telephone at the time of interpretation on 11/30/2014 at 2:17 pm to Dr. Verneda Skill, who verbally acknowledged these results.  Enlarging hyperdense lesion in the left kidney worrisome for neoplasm. MRI with contrast is recommended when feasible.  I discussed CT with Dr. Barbie Banner.   Past Medical History  Diagnosis Date  . CVA (cerebral infarction)   . Memory loss   . Ventral hernia   . Sepsis   . Essential tremor   . Paroxysmal atrial fibrillation   . ED (erectile dysfunction)   . Arthritis       Past Surgical History  Procedure Laterality Date  . Gallbladder surgery    . Hernia repair    . Perforated intes    . Colon surgery  Current Facility-Administered Medications  Medication Dose Route Frequency Provider Last Rate Last Dose  . 0.9 %  sodium chloride infusion   Intravenous Continuous Larene Pickett, PA-C       Current Outpatient Prescriptions  Medication Sig Dispense Refill  . dipyridamole-aspirin (AGGRENOX) 200-25 MG per 12 hr capsule Take 1 capsule by mouth 2 (two) times daily.    Marland Kitchen donepezil (ARICEPT) 23 MG TABS tablet TAKE 1 TABLET BY MOUTH AT BEDTIME 90 tablet 1  . fexofenadine (ALLEGRA) 180 MG tablet Take 180 mg by mouth daily.    . fluticasone (FLONASE) 50 MCG/ACT nasal spray Place 2 sprays into the nose at bedtime.    . memantine (NAMENDA) 10 MG tablet Take 10 mg by mouth 2 (two)  times daily.  11  . Multiple Vitamins-Minerals (ICAPS LUTEIN & ZEAXANTHIN) TBEC Take 1 tablet by mouth daily.    . multivitamin-iron-minerals-folic acid (CENTRUM) chewable tablet Chew 1 tablet by mouth daily.    . propranolol ER (INDERAL LA) 60 MG 24 hr capsule Take 1 capsule (60 mg total) by mouth daily.    . vitamin E 400 UNIT capsule Take 400 Units by mouth as directed.    Marland Kitchen QUEtiapine (SEROQUEL) 25 MG tablet TAKE ONE-HALF (1/2) TABLET BY MOUTH AT BEDTIME (Patient not taking: Reported on 11/30/2014) 45 tablet 1      Allergies  Allergen Reactions  . Topamax [Topiramate]     REVIEW OF SYSTEMS: Skin:  No history of rash.  No history of abnormal moles. Infection:  No history of hepatitis or HIV.  No history of MRSA. Neurologic:  Remote left [place] brain stroke - 2006.  Dementia.  Followed by Dr. Royal Hawthorn. Cardiac:  History of Parox Supraven tachycardias. Saw Dr. Tamala Julian.  This is remote. Pulmonary:  Does not smoke cigarettes.  No asthma or bronchitis.  No OSA/CPAP.  Endocrine:  No diabetes. No thyroid disease. Gastrointestinal:  No history of stomach disease.  No history of liver disease.  February 04, 2003 - open cholecystectomy - [facility].  Laparoscopic repair of ventral incisional hernia with polyester mesh - 02/14/2004 - [facility].  .No history of pancreas disease.   Urologic:  No history of kidney stones.  No history of bladder infections. Musculoskeletal:  History of back surgery - 2005 - [facility] Hematologic:  No bleeding disorder.  No history of anemia.  Not anticoagulated. Psycho-social:  Moderate dementia.  SOCIAL and FAMILY HISTORY: Married.  His wife, [name] is his primary caregiver. Son, [name] in room. He has a daughter.  PHYSICAL EXAM: BP 130/49 mmHg  Pulse 59  Temp(Src) 99.8 F (37.7 C) (Oral)  Resp 16  SpO2 95%  General: Older WM who is alert and responds, but is not engaged in our conversation. HEENT: Normal. Pupils equal. Neck: Supple. No mass.  No thyroid mass. Lymph  Nodes:  No supraclavicular or cervical nodes. Lungs: Clear to auscultation and symmetric breath sounds. Heart:  RRR. No murmur or rub. Abdomen: Soft. Tender to the left of midline.  Has right subcostal and midline incision. Rectal: Not done. Extremities:  Good strength and ROM  in upper and lower extremities. Neurologic:  Grossly intact to motor and sensory function. Psychiatric: Has normal mood and affect. Behavior is normal.   DATA REVIEWED: Epic notes  Alphonsa Overall, MD,  [facility] [address] [place]  [place], [place]    [place] Phone:  [place]:  [phone]

## 2014-11-30 NOTE — Progress Notes (Signed)
ANTIBIOTIC CONSULT NOTE - INITIAL  Pharmacy Consult for zosyn Indication: intr-abdominal infection  Allergies  Allergen Reactions  . Topamax [Topiramate]     Patient Measurements: Height: 5\' 10"  (177.8 cm) Weight: 185 lb (83.915 kg) IBW/kg (Calculated) : 73   Vital Signs: Temp: 99 F (37.2 C) (06/18 1536) Temp Source: Oral (06/18 1536) BP: 129/53 mmHg (06/18 1536) Pulse Rate: 87 (06/18 1536) Intake/Output from previous day:   Intake/Output from this shift:    Labs:  Recent Labs  11/30/14 1150  WBC 10.0  HGB 12.3*  PLT 147*  CREATININE 1.69*   Estimated Creatinine Clearance: 29.4 mL/min (by C-G formula based on Cr of 1.69). No results for input(s): VANCOTROUGH, VANCOPEAK, VANCORANDOM, GENTTROUGH, GENTPEAK, GENTRANDOM, TOBRATROUGH, TOBRAPEAK, TOBRARND, AMIKACINPEAK, AMIKACINTROU, AMIKACIN in the last 72 hours.   Microbiology: No results found for this or any previous visit (from the past 720 hour(s)).  Medical History: Past Medical History  Diagnosis Date  . CVA (cerebral infarction)   . Memory loss   . Ventral hernia   . Sepsis   . Essential tremor   . Paroxysmal atrial fibrillation   . ED (erectile dysfunction)   . Arthritis     Assessment: 81 y.o. With small bowel perforation, pharmacy to dose zosyn.  Scr 1.69, CrCl ~ 29.26mls/min  Goal of Therapy:  Zosyn per renal function  Plan:  Zosyn 3.375mg  IV q8h extended interval Follow renal function, cultures and clinical course  Dolly Rias RPh 11/30/2014, 4:46 PM Pager [phone]

## 2014-11-30 NOTE — ED Notes (Signed)
Nurse currently in room, starting IV

## 2014-11-30 NOTE — Transfer of Care (Signed)
Immediate Anesthesia Transfer of Care Note  Patient: [name]  Procedure(s) Performed: Procedure(s): LAPAROSCOPIC LYSIS OF ADHESIONS (N/A) SMALL BOWEL RESECTION (N/A)  Patient Location: PACU  Anesthesia Type:General  Level of Consciousness: awake, alert  and patient cooperative  Airway & Oxygen Therapy: Patient Spontanous Breathing and Patient connected to face mask oxygen  Post-op Assessment: Report given to RN and Post -op Vital signs reviewed and stable  Post vital signs: Reviewed and stable  Last Vitals:  Filed Vitals:   11/30/14 1536  BP: 129/53  Pulse: 87  Temp: 37.2 C  Resp: 17    Complications: No apparent anesthesia complications

## 2014-11-30 NOTE — ED Notes (Signed)
Given that Dr. Lucia Gaskins is still in our department reviewing data on my pt. And is not sure whether pt. Will go to O.R. Acutely I defer taking him to the floor.  He remains in no distress.

## 2014-11-30 NOTE — Anesthesia Postprocedure Evaluation (Signed)
  Anesthesia Post-op Note  Patient: [name]  Procedure(s) Performed: Procedure(s): LAPAROSCOPIC LYSIS OF ADHESIONS (N/A) SMALL BOWEL RESECTION (N/A)  Patient Location: PACU  Anesthesia Type:General  Level of Consciousness: awake and alert   Airway and Oxygen Therapy: Patient Spontanous Breathing and Patient connected to nasal cannula oxygen  Post-op Pain: none  Post-op Assessment: Post-op Vital signs reviewed, Patient's Cardiovascular Status Stable, Respiratory Function Stable, Patent Airway, No signs of Nausea or vomiting and Pain level controlled              Post-op Vital Signs: Reviewed and stable  Last Vitals:  Filed Vitals:   11/30/14 2215  BP: 163/86  Pulse: 106  Temp:   Resp: 16    Complications: No apparent anesthesia complications

## 2014-11-30 NOTE — ED Notes (Signed)
He c/o sudden onset of lower abd. Pain R=L this morning at 0700 hours.  His wife is with him and his belly is quite tender to light palpation.  He is in no distress and minimal discomfort when he is not moving.

## 2014-11-30 NOTE — Anesthesia Preprocedure Evaluation (Addendum)
Anesthesia Evaluation  Patient identified by MRN, date of birth, ID band Patient awake    Reviewed: Allergy & Precautions, NPO status , Patient's Chart, lab work & pertinent test results, reviewed documented beta blocker date and time   Airway Mallampati: III  TM Distance: >3 FB Neck ROM: Full    Dental  (+) Edentulous Lower, Edentulous Upper   Pulmonary former smoker,  breath sounds clear to auscultation  Pulmonary exam normal       Cardiovascular Exercise Tolerance: Poor hypertension, Pt. on medications and Pt. on home beta blockers + Peripheral Vascular Disease Normal cardiovascular exam+ dysrhythmias Atrial Fibrillation Rhythm:Regular Rate:Normal  Right Carotid stenosis   Neuro/Psych PSYCHIATRIC DISORDERS Senile DementiaEssential tremor CVA, Residual Symptoms    GI/Hepatic Perforated viscus   Endo/Other  negative endocrine ROS  Renal/GU ARFRenal disease  negative genitourinary   Musculoskeletal  (+) Arthritis -, Osteoarthritis,    Abdominal (+)  Abdomen: soft and tender.    Peds  Hematology  (+) anemia , Thrombocytopenia- mild   Anesthesia Other Findings   Reproductive/Obstetrics                            Anesthesia Physical Anesthesia Plan  ASA: IV and emergent  Anesthesia Plan: General   Post-op Pain Management:    Induction: Intravenous  Airway Management Planned: Oral ETT  Additional Equipment:   Intra-op Plan:   Post-operative Plan: Possible Post-op intubation/ventilation  Informed Consent: I have reviewed the patients History and Physical, chart, labs and discussed the procedure including the risks, benefits and alternatives for the proposed anesthesia with the patient or authorized representative who has indicated his/her understanding and acceptance.   Dental advisory given  Plan Discussed with: Anesthesiologist  Anesthesia Plan Comments:          Anesthesia Quick Evaluation

## 2014-11-30 NOTE — Consult Note (Addendum)
[facility]       PATIENT DETAILS Name: [name] Age: 81 y.o. Sex: male Date of Birth: 26-Aug-1920 Admit Date: 11/30/2014 MOQ:[id number] R, MD Requesting MD: Dr Lucia Gaskins  Date of consultation:11/30/14  REASON FOR CONSULTATION:  Management of medical comorbidities.  Impression/Recommendations Small bowel perforation: Await general surgery evaluation, but suspect patient will require a emergent laparotomy today. Explained to spouse and son at bedside the emergent nature of this surgery, they are accepting all risks and willing to proceed. Suspect patient will benefit from monitoring in  intensive care unit or stepdown unit post surgery. Defer further to general surgery.  Acute renal failure: Likely secondary to prerenal azotemia due to above. Hydrate with IV fluids, recheck electrolytes in a.m. Place Foley catheter. Strict intake/output. Avoid nephrotoxic agents.  History of dementia: At risk of delirium. Check EKG-if QTC okay-start as needed Haldol. Given nothing by mouth status-Will need to hold all oral medications.  History of atrial fibrillation:Chads2vasc score of 4, reviewed prior outpatient neurology notes-given advanced age, frailty and gait instability not a candidate for anticoagulation. Monitor in telemetry. Given nothing by mouth status-hold Aggrenox.  Code Status: Full code-family will discuss among themselves and also will get patient's advanced directive to the hospital tomorrow  [facility] will followup again tomorrow. As noted above, if patient's medical issues in the post operative period become significant, we will be more than happy to take on our service. Please contact me if I can be of assistance in the meantime. Thank you for this consultation.  [facility] Pager [phone]  HPI: [name] is a 81 y.o. male with a Past Medical History dementia, CVA, paroxysmal atrial fibrillation on  anticoagulation of  who presents today with the above noted complaint. Please note, patient is a poor historian-has dementia. Most of this history is obtained after speaking with the patient's spouse and son at bedside. At baseline, patient has dementia but is ambulatory with help of a walker, is able to perform most of his activities of daily living with close supervision from spouse. He was in his usual state of health till yesterday, he woke up this morning he was complaining of abdominal pain. His spouse noted that he was very uncomfortable. There was no history of nausea, vomiting or diarrhea. While getting dressed, patient became weak and sustained a presyncopal episode and fell backwards onto his bed. There was no loss of consciousness. His spouse noted that he was sweating during thistime. He was subsequently taken to a local urgent care, and was referred to the emergency room for further evaluation and treatment. In the emergency room, CT scan of the abdomen showed free air consistent with a small bowel perforation, Gen. surgery was consulted who requested, this M.D. spoke with Dr. Lucia Gaskins from general surgery, we have agreed that the patient will be currently admitted to the general surgical service, hospitalist service will follow. Postoperatively if the patient develops significant medical issues, patient will be then transferred to the hospitalist service.    ALLERGIES:   Allergies  Allergen Reactions  . Topamax [Topiramate]     PAST MEDICAL HISTORY: Past Medical History  Diagnosis Date  . CVA (cerebral infarction)   . Memory loss   . Ventral hernia   . Sepsis   . Essential tremor   . Paroxysmal atrial fibrillation   . ED (erectile dysfunction)   . Arthritis     PAST SURGICAL HISTORY: Past Surgical History  Procedure Laterality Date  .  Gallbladder surgery    . Hernia repair    . Perforated intes    . Colon surgery      MEDICATIONS AT HOME: Prior to Admission medications     Medication Sig Start Date End Date Taking? Authorizing Provider  dipyridamole-aspirin (AGGRENOX) 200-25 MG per 12 hr capsule Take 1 capsule by mouth 2 (two) times daily.   Yes Historical Provider, MD  donepezil (ARICEPT) 23 MG TABS tablet TAKE 1 TABLET BY MOUTH AT BEDTIME 11/03/14  Yes Garvin Fila, MD  fexofenadine (ALLEGRA) 180 MG tablet Take 180 mg by mouth daily.   Yes Historical Provider, MD  fluticasone (FLONASE) 50 MCG/ACT nasal spray Place 2 sprays into the nose at bedtime. 01/01/13  Yes Historical Provider, MD  memantine (NAMENDA) 10 MG tablet Take 10 mg by mouth 2 (two) times daily. 10/22/14  Yes Historical Provider, MD  Multiple Vitamins-Minerals (ICAPS LUTEIN & ZEAXANTHIN) TBEC Take 1 tablet by mouth daily.   Yes Historical Provider, MD  multivitamin-iron-minerals-folic acid (CENTRUM) chewable tablet Chew 1 tablet by mouth daily.   Yes Historical Provider, MD  propranolol ER (INDERAL LA) 60 MG 24 hr capsule Take 1 capsule (60 mg total) by mouth daily. 02/19/13  Yes Philmore Pali, NP  vitamin E 400 UNIT capsule Take 400 Units by mouth as directed.   Yes Historical Provider, MD  QUEtiapine (SEROQUEL) 25 MG tablet TAKE ONE-HALF (1/2) TABLET BY MOUTH AT BEDTIME Patient not taking: Reported on 11/30/2014 09/11/14   Garvin Fila, MD    FAMILY HISTORY: Family History  Problem Relation Age of Onset  . Aneurysm Mother     stomach  . Dementia Father   . Stroke Father     SOCIAL HISTORY:  reports that he has quit smoking. His smoking use included Cigarettes. He has never used smokeless tobacco. He reports that he does not drink alcohol or use illicit drugs.  REVIEW OF SYSTEMS:  Constitutional:   No  weight loss, night sweats,  Fevers, chills, fatigue.  HEENT:    No headaches, Difficulty swallowing,Tooth/dental problems,Sore throat,  No sneezing, itching, ear ache, nasal congestion, post nasal drip,   Cardio-vascular: No chest pain,  Orthopnea, PND, swelling in lower extremities,  anasarca,   dizziness, palpitations  GI:  No heartburn, indigestion, abdominal pain, nausea, vomiting, diarrhea  Resp: No shortness of breath with exertion or at rest.  No excess mucus, no productive cough, No non-productive cough,  No coughing up of blood.No change in color of mucus.No wheezing.No chest wall deformity  Skin:  no rash or lesions.  GU:  no dysuria, change in color of urine, no urgency or frequency.  No flank pain.  Musculoskeletal: No joint pain or swelling.  No decreased range of motion.  No back pain.  Psych: No change in mood or affect. No depression or anxiety.  No memory loss.   PHYSICAL EXAM: Blood pressure 130/49, pulse 59, temperature 99.8 F (37.7 C), temperature source Oral, resp. rate 16, SpO2 95 %.  General appearance :Awake, alert, not in any distress. Speech Clear. Not toxic Looking HEENT: Atraumatic and Normocephalic, pupils equally reactive to light and accomodation Neck: supple, no JVD. No cervical lymphadenopathy.  Chest:Good air entry bilaterally, no added sounds  CVS: S1 S2 regular, no murmurs.  Abdomen: Bowel sounds sluggish, soft-but diffusely tender in the upper abdomen with some mild rebound. Extremities: B/L Lower Ext shows no edema, both legs are warm to touch Neurology: Awake alert, pleasantly confused-but nonfocal exam.   Skin:No  Rash Wounds:N/A  LABS ON ADMISSION:   Recent Labs  11/30/14 1150  NA 139  K 4.7  CL 109  CO2 25  GLUCOSE 108*  BUN 22*  CREATININE 1.69*  CALCIUM 8.5*    Recent Labs  11/30/14 1150  AST 25  ALT 14*  ALKPHOS 52  BILITOT 1.2  PROT 6.7  ALBUMIN 3.7    Recent Labs  11/30/14 1150  LIPASE 22    Recent Labs  11/30/14 1150  WBC 10.0  NEUTROABS 8.1*  HGB 12.3*  HCT 38.0*  MCV 99.0  PLT 147*   No results for input(s): CKTOTAL, CKMB, CKMBINDEX, TROPONINI in the last 72 hours. No results for input(s): DDIMER in the last 72 hours. Invalid input(s): POCBNP   RADIOLOGIC STUDIES ON  ADMISSION: Ct Abdomen Pelvis W Contrast  11/30/2014   CLINICAL DATA:  Sudden onset of the lower abdominal pain this morning.  EXAM: CT ABDOMEN AND PELVIS WITH CONTRAST  TECHNIQUE: Multidetector CT imaging of the abdomen and pelvis was performed using the standard protocol following bolus administration of intravenous contrast.  CONTRAST:  68mL OMNIPAQUE IOHEXOL 300 MG/ML SOLN, 44mL OMNIPAQUE IOHEXOL 300 MG/ML SOLN  COMPARISON:  03/11/2008  FINDINGS: There is laxity of the anterior abdominal wall after hernia mesh repair. This has not appreciably changed.  Postcholecystectomy.  5 mm hypodensity in the right lobe of the liver on image 22.  Spleen, pancreas, and adrenal glands are within normal limits.  Several low-density lesions are seen in both kidneys. There is 1 exophytic lesion measuring 1.4 cm in the left kidney with elevated Hounsfield unit measurements that has increased in size and today measures 1.4 cm. See image 29 of series 2.  Large duodenal diverticulum on image 26 is stable.  There are several small loculation of free intraperitoneal gas adjacent to left anterior small bowel loops on images 39 through 46. There is extensive stranding in the mesenteric fat. Tiny hyperdense foci within the mesenteric fat on image 42 are noted. They may represent tiny loculations of extraluminal contrast are possibly calcifications.  Extensive diverticulosis of the sigmoid colon is present. There is subtle stranding within the fat adjacent to the sigmoid colon. This is probably non significant and may be related to the adjacent inflammatory process.  Bladder is decompressed.  Left inguinal hernia contains adipose tissue.  This is stable.  Multilevel degenerative disc disease in the lower thoracic and lumbar spine. No vertebral compression deformity.  IMPRESSION: There is free intraperitoneal gas and fatty inflammatory changes adjacent to small bowel loops compatible with a small bowel inflammatory process and rupture.  There is no adjacent abscess. Critical Value/emergent results were called by telephone at the time of interpretation on 11/30/2014 at 2:17 pm to Dr. Verneda Skill, who verbally acknowledged these results.  Enlarging hyperdense lesion in the left kidney worrisome for neoplasm. MRI with contrast is recommended when feasible.   Electronically Signed   By: Marybelle Killings M.D.   On: 11/30/2014 14:17     Total time spent 45 minutes.  [facility] Pager [phone]  If 7PM-7AM, please contact night-coverage [email or web address] Password [email or web address] 11/30/2014, 3:30 PM

## 2014-11-30 NOTE — ED Notes (Signed)
O.R. Will call for pt. Shortly, as he is their next case.

## 2014-12-01 LAB — CBC
HCT: 38.4 % — ABNORMAL LOW (ref 39.0–52.0)
Hemoglobin: 12.5 g/dL — ABNORMAL LOW (ref 13.0–17.0)
MCH: 32.1 pg (ref 26.0–34.0)
MCHC: 32.6 g/dL (ref 30.0–36.0)
MCV: 98.7 fL (ref 78.0–100.0)
Platelets: 150 10*3/uL (ref 150–400)
RBC: 3.89 MIL/uL — AB (ref 4.22–5.81)
RDW: 12.7 % (ref 11.5–15.5)
WBC: 7.8 10*3/uL (ref 4.0–10.5)

## 2014-12-01 LAB — MRSA PCR SCREENING: MRSA BY PCR: NEGATIVE

## 2014-12-01 LAB — BASIC METABOLIC PANEL
ANION GAP: 7 (ref 5–15)
BUN: 20 mg/dL (ref 6–20)
CALCIUM: 8.1 mg/dL — AB (ref 8.9–10.3)
CHLORIDE: 107 mmol/L (ref 101–111)
CO2: 23 mmol/L (ref 22–32)
Creatinine, Ser: 1.59 mg/dL — ABNORMAL HIGH (ref 0.61–1.24)
GFR calc non Af Amer: 36 mL/min — ABNORMAL LOW (ref >60)
GFR, EST AFRICAN AMERICAN: 42 mL/min — AB (ref >60)
GLUCOSE: 151 mg/dL — AB (ref 65–99)
POTASSIUM: 4.4 mmol/L (ref 3.5–5.1)
SODIUM: 137 mmol/L (ref 135–145)

## 2014-12-01 MED ORDER — HALOPERIDOL LACTATE 5 MG/ML IJ SOLN
1.0000 mg | Freq: Four times a day (QID) | INTRAMUSCULAR | Status: DC | PRN
  Administered 2014-12-02: 1 mg via INTRAVENOUS
  Filled 2014-12-01: qty 1

## 2014-12-01 MED ORDER — VITAMINS A & D EX OINT
TOPICAL_OINTMENT | CUTANEOUS | Status: AC
  Administered 2014-12-01: 1
  Filled 2014-12-01: qty 5

## 2014-12-01 MED ORDER — METOPROLOL TARTRATE 1 MG/ML IV SOLN
5.0000 mg | Freq: Three times a day (TID) | INTRAVENOUS | Status: DC
  Administered 2014-12-01 – 2014-12-08 (×21): 5 mg via INTRAVENOUS
  Filled 2014-12-01 (×21): qty 5

## 2014-12-01 NOTE — Op Note (Signed)
NAME:  [name], [name] NO.:  [id number]  MEDICAL RECORD NO.:  [id number]  LOCATION:  [facility]                         FACILITY:  [facility]  PHYSICIAN:  Fenton Malling. Lucia Gaskins, M.D.  DATE OF BIRTH:  August 29, 1920  DATE OF PROCEDURE:  11/30/2014                              OPERATIVE REPORT   PREOPERATIVE DIAGNOSIS:  Perforated small bowel.  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   Extensive small bowel adhesions.  Mesh right upper abdomen.  Hernia defects below mesh and in midline.  (see photo in chart and at end of note)  PROCEDURE:  Diagnostic laparoscopy, Laparoscopic enterolysis of adhesions for 100 minutes, open open small bowel resection.  SURGEON:  Fenton Malling. Lucia Gaskins, M.D.  FIRST ASSISTANT:  None.  ANESTHESIA:  General endotracheal supervised by Dr. Josephine Igo.  ESTIMATED BLOOD LOSS:  100 mL.  DRAINS:  Left in were none.  SPECIMEN:  A small bowel resected.  INDICATION FOR PROCEDURE:  [name] is a 81 year old white male who is a patient of Dr. Gaynelle Arabian.  He presented to [facility] with acute onset of abdominal pain and a CT scan suggests a focal perforation of the small bowel.  Of note, the patient has had a prior perforation of jejunal diverticula in October of 2008 requiring surgery by Dr. Brantley Stage.  Since that time, he had a laparoscopic converted open right hemicolectomy by Dr. Brantley Stage.  This episode is very similar to the episode that Dr. Brantley Stage with the perforated jejunal diverticula in 2008.  The patient has dementia and is not really involved in his care.  I spoke with his wife and son who were at the bedside.  I explained the options for treatment which would be medical management versus surgery.  My thought is that he will be best served with surgery though his age and his dementia make this a very high risk operation and the family understands this.  OPERATIVE NOTE:  Taken to the operating room #1, underwent a  general endotracheal anesthesia supervised by Dr. Josephine Igo.  He was on Zosyn as antibiotic.  He was in a supine position, had his arms tucked, had a foley catheter placed.  A time-out was held and surgical checklist run.  I accessed the abdominal cavity through an infraumbilical incision with sharp dissection and carried down to the abdominal cavity.  I placed a Hasson trocar.  I placed 4 additional trocars:  A 5 mm in the left upper quadrant, a 51mm in the left lower quadrant, 5 mm in the lower midline, and a 81mm in the right lower quadrant.    He had extensive adhesions of small bowel to the anterior abdominal wall, and I spent about 100 minutes enterolysing of adhesions off the anterior abdominal wall.  I used only scissors and did not use "heat" to do any to of the dissection.  He had a prior mesh repair with the mesh placed in the right upper quadrant of the abdominal cavity.  At the end of the case, I drew a blue line on the patient's abdomen outlying the edge of the mesh, also it shows where my incision is and took  a picture of this and put it in the chart.  Along the inferior aspect of the mesh, there were multiple fascial defects.  These were unrelated to his current problem.  I did not free up his entire small bowel, but probably the proximal 80% of the small bowel.  Much of his jejunum had various sized jejunal diverticula into the mesentery and I think that it was one of those and perforated again.  He had prior right hemicolectomy, there was a lot of small bowel in the right colonic gutter up to the right transverse colon.  I did not feel that taking down would be of benefit.  I identified Dr. Josetta Huddle old small bowel resection which was about 20 cm to 30 cm proximal to where I found this current perforation.  I found loculated purulence and some significant induration in the small bowel.  I made a 7-cm incision into the abdominal cavity.  I brought out this loop of bowel that I identified  what I thought was the source of perforation.  I then did a staple side-to-side anastomosis with a 75 GIA stapler.  I then crossed this with a 60 TA stapler.  I  left about a 2.5-cm opening in the small bowel.  I then over sewed the staple line with 3-0 silk sutures and closed the mesentery with 2-0 silk sutures.  The small bowel looked viable, I dropped the anastomosis back into the abdominal cavity. I irrigated with about a couple liters of saline.  I then closed the abdomen with inverting #1 PDS sutures.  Because of the contamination,  I left the wound left packed open.  I stapled the trocar sites.  I did take pictures of the anterior abdominal wall and drew a diagram of first where this prior mesh had been placed which actually I did not go through in making my midline incision.  I pointed out where there were hernias on the inferior edge of this mesh and took a picture of that.  His sponge and needle counter were correct at the end of the case.  The patient tolerated the procedure well.  He was transported to the recovery room.  We will plan on staying step-down tonight.  He will need  is NG tube in place until his bowel shows evidence of function.     Blue is the outline of the prior mesh repair. Incision has gauze in it. Trocar sites in RLQ, LLQ, Left lateral abdomen.  Fenton Malling. Lucia Gaskins, M.D., [facility], scribe for Epic   DHN/MEDQ  D:  11/30/2014  T:  11/30/2014  Job:  [id number]  cc:   Gaynelle Arabian, Dr.  Leonie Man, Dr.

## 2014-12-01 NOTE — Progress Notes (Signed)
General Surgery Note  LOS: 1 day  POD -  1 Day Post-Op  Assessment/Plan: 1.  LAPAROSCOPIC LYSIS OF ADHESIONS, SMALL BOWEL RESECTION - 11/30/2014 - D. Kimberlly Norgard  For perforated jejunal diverticula  On Zosyn - started 6/18 >>>  NGT, continue NPO  Looks better than expected at this point.  Will leave in step down for at least one more day.  Should get OOB to chair.  PT ordered.  1A.  Open wound - dressing changes ordered  2.Most recent 3 abdominal operations:  A.  Laparoscopic converted to open right hemicolectomy - 09/12/2008 - [facility]  B. History of contained perforation of jejunal diverticula - ex lap with SB resection - 04/05/2007 - [facility]  C. History of ventral hernia repair with polyester mesh (Parietex), upper abdomen - 2005 - [facility]  3. Tremors 4. Moderate senile dementia He sees Dr. Royal Hawthorn. Placed on Namenda in 08/2014  5. Remote left Pontine brain stroke - 2006 6. Remote history of Parox Supraven tachycardias Saw Dr. Tamala Julian 7. Left kidney mass seen on CT scan.  Discussed with wife. 8.  DVT prophylaxis - SQ Heparin 9.  Foley - will leave for today  Mildly elvated Creat - 1.59 - 12/01/2014   Active Problems:   Dementia, senile with delusions   Bowel perforation   ARF (acute renal failure)   Small bowel perforation  Subjective:  Looks good.  Realizes he is in a hospital.  Recognizes his wife.  Wife at bedside. Objective:   Filed Vitals:   12/01/14 0700  BP: 126/55  Pulse: 64  Temp:   Resp: 22     Intake/Output from previous day:  06/18 0701 - 06/19 0700 In: 3429.2 [I.V.:3379.2; IV Piggyback:50] Out: 725 [Urine:625; Blood:100]  Intake/Output this shift:      Physical Exam:   General: Older WM who is alert.    HEENT: Normal. Pupils equal.  Has NGT .   Lungs: Clear.  To get IS.   Abdomen: Soft. Quiet.   Wound: Dressed.     Lab Results:    Recent Labs  11/30/14 1150 12/01/14 0445  WBC 10.0 7.8  HGB 12.3*  12.5*  HCT 38.0* 38.4*  PLT 147* 150    BMET   Recent Labs  11/30/14 1150 12/01/14 0445  NA 139 137  K 4.7 4.4  CL 109 107  CO2 25 23  GLUCOSE 108* 151*  BUN 22* 20  CREATININE 1.69* 1.59*  CALCIUM 8.5* 8.1*    PT/INR  No results for input(s): LABPROT, INR in the last 72 hours.  ABG  No results for input(s): PHART, HCO3 in the last 72 hours.  Invalid input(s): PCO2, PO2   Studies/Results:  Ct Abdomen Pelvis W Contrast  11/30/2014   CLINICAL DATA:  Sudden onset of the lower abdominal pain this morning.  EXAM: CT ABDOMEN AND PELVIS WITH CONTRAST  TECHNIQUE: Multidetector CT imaging of the abdomen and pelvis was performed using the standard protocol following bolus administration of intravenous contrast.  CONTRAST:  48mL OMNIPAQUE IOHEXOL 300 MG/ML SOLN, 43mL OMNIPAQUE IOHEXOL 300 MG/ML SOLN  COMPARISON:  03/11/2008  FINDINGS: There is laxity of the anterior abdominal wall after hernia mesh repair. This has not appreciably changed.  Postcholecystectomy.  5 mm hypodensity in the right lobe of the liver on image 22.  Spleen, pancreas, and adrenal glands are within normal limits.  Several low-density lesions are seen in both kidneys. There is 1 exophytic lesion measuring 1.4 cm in the left kidney with  elevated Hounsfield unit measurements that has increased in size and today measures 1.4 cm. See image 29 of series 2.  Large duodenal diverticulum on image 26 is stable.  There are several small loculation of free intraperitoneal gas adjacent to left anterior small bowel loops on images 39 through 46. There is extensive stranding in the mesenteric fat. Tiny hyperdense foci within the mesenteric fat on image 42 are noted. They may represent tiny loculations of extraluminal contrast are possibly calcifications.  Extensive diverticulosis of the sigmoid colon is present. There is subtle stranding within the fat adjacent to the sigmoid colon. This is probably non significant and may be related to the  adjacent inflammatory process.  Bladder is decompressed.  Left inguinal hernia contains adipose tissue.  This is stable.  Multilevel degenerative disc disease in the lower thoracic and lumbar spine. No vertebral compression deformity.  IMPRESSION: There is free intraperitoneal gas and fatty inflammatory changes adjacent to small bowel loops compatible with a small bowel inflammatory process and rupture. There is no adjacent abscess. Critical Value/emergent results were called by telephone at the time of interpretation on 11/30/2014 at 2:17 pm to Dr. Verneda Skill, who verbally acknowledged these results.  Enlarging hyperdense lesion in the left kidney worrisome for neoplasm. MRI with contrast is recommended when feasible.   Electronically Signed   By: Marybelle Killings M.D.   On: 11/30/2014 14:17     Anti-infectives:   Anti-infectives    Start     Dose/Rate Route Frequency Ordered Stop   12/01/14 0100  piperacillin-tazobactam (ZOSYN) IVPB 3.375 g     3.375 g 12.5 mL/hr over 240 Minutes Intravenous Every 8 hours 11/30/14 1651     11/30/14 1645  piperacillin-tazobactam (ZOSYN) IVPB 3.375 g     3.375 g 100 mL/hr over 30 Minutes Intravenous  Once 11/30/14 1642 11/30/14 1722      Alphonsa Overall, MD, FACS Pager: [facility] Office: [phone] 12/01/2014

## 2014-12-01 NOTE — Progress Notes (Signed)
Spoke with spouse, children-code status discussed-they wish he be a DNI. Orders written.

## 2014-12-01 NOTE — Evaluation (Addendum)
Physical Therapy Evaluation Patient Details Name: [name] MRN: [id number] DOB: 02/14/1921 Today's Date: 12/01/2014   History of Present Illness  spt adm 11/30/14 secondary to a perforated jejunal diverticula. Underwent Laparoscopic lysis of adhesion and small bowel resection on 11/30/14. PMHx: abd surgeries, dementia, CVA, afib  Clinical Impression  Pt admitted with above diagnosis. Pt currently with functional limitations due to the deficits listed below (see PT Problem List).  Pt will benefit from skilled PT to increase their independence and safety with mobility to allow discharge to the venue listed below.  Pt should be able to D/C home with HHPT as long as he demo's steady progress; has RW;  family supportive;  VS-- HR 59-67, sats 91-97 on RA to 2L (replaced), BP 125/33       Follow Up Recommendations Home health PT;Supervision for mobility/OOB    Equipment Recommendations  None recommended by PT    Recommendations for Other Services       Precautions / Restrictions Precautions Precautions: Fall      Mobility  Bed Mobility Overal bed mobility: Needs Assistance;+ 2 for safety/equipment Bed Mobility: Rolling;Sidelying to Sit Rolling: Min assist Sidelying to sit: Mod assist;+2 for safety/equipment;HOB elevated       General bed mobility comments: cues for technique, incr time, HOB elevated 40*  Transfers Overall transfer level: Needs assistance Equipment used: Rolling walker (2 wheeled) Transfers: Sit to/from Stand Sit to Stand: From elevated surface;Mod assist;+2 safety/equipment         General transfer comment: 2 attempts to stand; LEs shaky initially  but pt has baseline tremors;   Ambulation/Gait Ambulation/Gait assistance: Min assist;Mod assist;+2 safety/equipment Ambulation Distance (Feet): 65 Feet Assistive device: Rolling walker (2 wheeled) Gait Pattern/deviations: Step-through pattern;Decreased stride length;Shuffle;Trunk flexed;Drifts  right/left     General Gait Details: cues for RW distance from self and posture; 2  brief standing rests during amb to monitor sats  Stairs            Wheelchair Mobility    Modified Rankin (Stroke Patients Only)       Balance Overall balance assessment: Needs assistance         Standing balance support: During functional activity;Bilateral upper extremity supported Standing balance-Leahy Scale: Poor Standing balance comment: posterior LOb with initial stand, multi-modal cues to correct                             Pertinent Vitals/Pain Pain Assessment: No/denies pain    Home Living Family/patient expects to be discharged to:: Private residence Living Arrangements: Spouse/significant other   Type of Home: House Home Access: Stairs to enter   Technical brewer of Steps: 3 Home Layout: One level Home Equipment: Environmental consultant - 2 wheels      Prior Function Level of Independence: Independent with assistive device(s)         Comments: amb with RW, otherwise very independent per pt dtr, does not drive; pt wife does driving     Hand Dominance        Extremity/Trunk Assessment   Upper Extremity Assessment: Defer to OT evaluation           Lower Extremity Assessment: Generalized weakness (bil LE/UE tremors)         Communication   Communication: No difficulties  Cognition Arousal/Alertness: Awake/alert Behavior During Therapy: WFL for tasks assessed/performed Overall Cognitive Status: Within Functional Limits for tasks assessed  General Comments      Exercises        Assessment/Plan    PT Assessment Patient needs continued PT services  PT Diagnosis Difficulty walking   PT Problem List Decreased strength;Decreased activity tolerance;Decreased mobility;Decreased balance  PT Treatment Interventions DME instruction;Gait training;Functional mobility training;Therapeutic activities;Therapeutic  exercise;Patient/family education   PT Goals (Current goals can be found in the Care Plan section) Acute Rehab PT Goals Patient Stated Goal: home PT Goal Formulation: With patient/family Time For Goal Achievement: 12/15/14 Potential to Achieve Goals: Good    Frequency Min 3X/week   Barriers to discharge        Co-evaluation               End of Session Equipment Utilized During Treatment: Gait belt Activity Tolerance: Patient tolerated treatment well Patient left: in chair;with call bell/phone within reach;with family/visitor present Nurse Communication: Mobility status         Time: 3151-7616 PT Time Calculation (min) (ACUTE ONLY): 32 min   Charges:   PT Evaluation $Initial PT Evaluation Tier I: 1 Procedure PT Treatments $Gait Training: 8-22 mins   PT G Codes:        Nyxon Strupp 12-29-2014, 3:36 PM

## 2014-12-01 NOTE — Progress Notes (Signed)
Utilization review completed.  

## 2014-12-01 NOTE — Progress Notes (Signed)
Initial Nutrition Assessment  DOCUMENTATION CODES:  Not applicable  INTERVENTION: - RD will monitor for diet advancement versus need for nutrition support  NUTRITION DIAGNOSIS:  Inadequate oral intake related to inability to eat as evidenced by NPO status.  GOAL:  Patient will meet greater than or equal to 90% of their needs  MONITOR:  Diet advancement, Weight trends, Labs, I & O's  REASON FOR ASSESSMENT:  Malnutrition Screening Tool  ASSESSMENT: 81 year old male with past medical history significant for prior stroke, essential tremor, paroxysmal A. fib, arthritis, dementia, presenting to the ED for abdominal pain. Pain was sudden onset this morning around 0700. Pain localized throughout his entire lower abdomen, described as sharp and stabbing. There is no radiation of pain. No nausea, vomiting, or diarrhea. Pain is significantly worse with movement. Appears to have perforation of small bowel to the left of midline near the umbilicus. Underwent emergent laparotomy 11/30/14.  Pt seen for MST. BMI indicates overweight status. He has hx of dementia and all information was provided by his wife. PTA, pt had a good appetite although it was decreased from 1 year ago. He has dentures which he wears at home but still is unable to tolerate very hard food items (such as raw fruits and vegetables). Wife states that weight has been stable and this is confirmed by weight hx review in chart.   Pt has NGT to suction with 200cc of dark green/brown drainage present at time of visit. Unable to meet needs. Will monitor for diet advancement versus need for TPN as well as GOC.  Physical assessment does not show muscle or fat wasting. Medications reviewed. Labs reviewed; creatinine elevated, Ca: 8.1 mg/dL, GFR: 36.  Height:  Ht Readings from Last 1 Encounters:  11/30/14 5\' 10"  (1.778 m)    Weight:  Wt Readings from Last 1 Encounters:  11/30/14 192 lb 0.3 oz (87.1 kg)    Ideal Body Weight:   75.45 kg (kg)  Wt Readings from Last 10 Encounters:  11/30/14 192 lb 0.3 oz (87.1 kg)  08/29/14 186 lb 12.8 oz (84.732 kg)  02/28/14 194 lb 6.4 oz (88.179 kg)  07/10/13 192 lb 8 oz (87.317 kg)  01/09/13 196 lb (88.905 kg)    BMI:  Body mass index is 27.55 kg/(m^2).  Estimated Nutritional Needs:  Kcal:  1700-1900  Protein:  70-80 grams  Fluid:  2-2.2 L/day  Skin:  Wound (see comment) (abdominal surgical wound)  Diet Order:  Diet NPO time specified  EDUCATION NEEDS:  No education needs identified at this time   Intake/Output Summary (Last 24 hours) at 12/01/14 1008 Last data filed at 12/01/14 0600  Gross per 24 hour  Intake 3429.17 ml  Output    725 ml  Net 2704.17 ml    Last BM:  PTA   Jarome Matin, RD, LDN Inpatient Clinical Dietitian Pager # [phone] After hours/weekend pager # [phone]

## 2014-12-01 NOTE — Progress Notes (Signed)
PATIENT DETAILS Name: [name] Age: 81 y.o. Sex: male Date of Birth: April 21, 1921 Admit Date: 11/30/2014 Admitting Physician Alphonsa Overall, MD WER:[id number] R, MD  Subjective: Pain at the operative site.NG tube in place. No BM/Flatus  Assessment/Plan: Active Problems: Small bowel perforation:secondary to a perforated jejunal diverticula. Underwent Laparoscopic lysis of adhesion and small bowel resection on 11/30/14.Continue Zosyn, Gen surgery managing. Await return of bowel-NG in place. Begin PT.  Acute renal failure: Likely secondary to prerenal azotemia due to above.Better with IVF.   History of atrial fibrillation:Chads2vasc score of 4, reviewed prior outpatient neurology notes-given advanced age, frailty and gait instability not a candidate for anticoagulation.Given nothing by mouth status-hold Aggrenox.  History of dementia: At risk of delirium. Prn Haldol  Code Status:remains a full code-family still discussing regarding code status.  VTE prophylaxis:SQ Heparin  Hospitalist service will continue to follow.  Antimicrobial agents  See below  Anti-infectives    Start     Dose/Rate Route Frequency Ordered Stop   12/01/14 0100  piperacillin-tazobactam (ZOSYN) IVPB 3.375 g     3.375 g 12.5 mL/hr over 240 Minutes Intravenous Every 8 hours 11/30/14 1651     11/30/14 1645  piperacillin-tazobactam (ZOSYN) IVPB 3.375 g     3.375 g 100 mL/hr over 30 Minutes Intravenous  Once 11/30/14 1642 11/30/14 1722     Time spent 30 minutes-Greater than 50% of this time was spent in counseling, explanation of diagnosis, planning of further management, and coordination of care  MEDICATIONS: Scheduled Meds: . antiseptic oral rinse  7 mL Mouth Rinse q12n4p  . chlorhexidine  15 mL Mouth Rinse BID  . heparin  5,000 Units Subcutaneous 3 times per day  . metoprolol  5 mg Intravenous 3 times per day  . piperacillin-tazobactam (ZOSYN)  IV  3.375 g Intravenous Q8H    Continuous Infusions: . dextrose 5 % and 0.45 % NaCl with KCl 20 mEq/L 125 mL/hr (12/01/14 0751)   PRN Meds:.morphine injection, ondansetron **OR** ondansetron (ZOFRAN) IV  PHYSICAL EXAM: Vital signs in last 24 hours: Filed Vitals:   12/01/14 0600 12/01/14 0700 12/01/14 0800 12/01/14 0826  BP: 124/47 126/55 131/46   Pulse: 76 64 75   Temp:    98.7 F (37.1 C)  TempSrc:    Oral  Resp: 18 22 21    Height:      Weight:      SpO2: 99% 98% 98%     Weight change:  Filed Weights   11/30/14 1638 11/30/14 2300  Weight: 83.915 kg (185 lb) 87.1 kg (192 lb 0.3 oz)   Body mass index is 27.55 kg/(m^2).   Gen Exam: Awake and alert-pleasantly confused Neck: Supple, No JVD.   Chest: B/L Clear.   CVS: S1 S2 Regular, no murmurs.  Abdomen: soft, dressing in place-appropriately tender  Extremities: no edema, lower extremities warm to touch Neurologic: Non Focal.    Intake/Output from previous day:  Intake/Output Summary (Last 24 hours) at 12/01/14 0842 Last data filed at 12/01/14 0600  Gross per 24 hour  Intake 3429.17 ml  Output    725 ml  Net 2704.17 ml     LAB RESULTS: CBC  Recent Labs Lab 11/30/14 1150 12/01/14 0445  WBC 10.0 7.8  HGB 12.3* 12.5*  HCT 38.0* 38.4*  PLT 147* 150  MCV 99.0 98.7  MCH 32.0 32.1  MCHC 32.4 32.6  RDW 12.7 12.7  LYMPHSABS 1.3  --  MONOABS 0.6  --   EOSABS 0.0  --   BASOSABS 0.0  --     Chemistries   Recent Labs Lab 11/30/14 1150 12/01/14 0445  NA 139 137  K 4.7 4.4  CL 109 107  CO2 25 23  GLUCOSE 108* 151*  BUN 22* 20  CREATININE 1.69* 1.59*  CALCIUM 8.5* 8.1*    CBG: No results for input(s): GLUCAP in the last 168 hours.  GFR Estimated Creatinine Clearance: 31.2 mL/min (by C-G formula based on Cr of 1.59).  Coagulation profile No results for input(s): INR, PROTIME in the last 168 hours.  Cardiac Enzymes No results for input(s): CKMB, TROPONINI, MYOGLOBIN in the last 168 hours.  Invalid input(s): CK  Invalid  input(s): POCBNP No results for input(s): DDIMER in the last 72 hours. No results for input(s): HGBA1C in the last 72 hours. No results for input(s): CHOL, HDL, LDLCALC, TRIG, CHOLHDL, LDLDIRECT in the last 72 hours. No results for input(s): TSH, T4TOTAL, T3FREE, THYROIDAB in the last 72 hours.  Invalid input(s): FREET3 No results for input(s): VITAMINB12, FOLATE, FERRITIN, TIBC, IRON, RETICCTPCT in the last 72 hours.  Recent Labs  11/30/14 1150  LIPASE 22    Urine Studies No results for input(s): UHGB, CRYS in the last 72 hours.  Invalid input(s): UACOL, UAPR, USPG, UPH, UTP, UGL, UKET, UBIL, UNIT, UROB, ULEU, UEPI, UWBC, URBC, UBAC, CAST, UCOM, BILUA  MICROBIOLOGY: Recent Results (from the past 240 hour(s))  MRSA PCR Screening     Status: None   Collection Time: 11/30/14 11:07 PM  Result Value Ref Range Status   MRSA by PCR NEGATIVE NEGATIVE Final    Comment:        The GeneXpert MRSA Assay (FDA approved for NASAL specimens only), is one component of a comprehensive MRSA colonization surveillance program. It is not intended to diagnose MRSA infection nor to guide or monitor treatment for MRSA infections.     RADIOLOGY STUDIES/RESULTS: Ct Abdomen Pelvis W Contrast  11/30/2014   CLINICAL DATA:  Sudden onset of the lower abdominal pain this morning.  EXAM: CT ABDOMEN AND PELVIS WITH CONTRAST  TECHNIQUE: Multidetector CT imaging of the abdomen and pelvis was performed using the standard protocol following bolus administration of intravenous contrast.  CONTRAST:  70mL OMNIPAQUE IOHEXOL 300 MG/ML SOLN, 46mL OMNIPAQUE IOHEXOL 300 MG/ML SOLN  COMPARISON:  03/11/2008  FINDINGS: There is laxity of the anterior abdominal wall after hernia mesh repair. This has not appreciably changed.  Postcholecystectomy.  5 mm hypodensity in the right lobe of the liver on image 22.  Spleen, pancreas, and adrenal glands are within normal limits.  Several low-density lesions are seen in both kidneys.  There is 1 exophytic lesion measuring 1.4 cm in the left kidney with elevated Hounsfield unit measurements that has increased in size and today measures 1.4 cm. See image 29 of series 2.  Large duodenal diverticulum on image 26 is stable.  There are several small loculation of free intraperitoneal gas adjacent to left anterior small bowel loops on images 39 through 46. There is extensive stranding in the mesenteric fat. Tiny hyperdense foci within the mesenteric fat on image 42 are noted. They may represent tiny loculations of extraluminal contrast are possibly calcifications.  Extensive diverticulosis of the sigmoid colon is present. There is subtle stranding within the fat adjacent to the sigmoid colon. This is probably non significant and may be related to the adjacent inflammatory process.  Bladder is decompressed.  Left inguinal hernia contains  adipose tissue.  This is stable.  Multilevel degenerative disc disease in the lower thoracic and lumbar spine. No vertebral compression deformity.  IMPRESSION: There is free intraperitoneal gas and fatty inflammatory changes adjacent to small bowel loops compatible with a small bowel inflammatory process and rupture. There is no adjacent abscess. Critical Value/emergent results were called by telephone at the time of interpretation on 11/30/2014 at 2:17 pm to Dr. Verneda Skill, who verbally acknowledged these results.  Enlarging hyperdense lesion in the left kidney worrisome for neoplasm. MRI with contrast is recommended when feasible.   Electronically Signed   By: Marybelle Killings M.D.   On: 11/30/2014 14:17    Oren Binet, MD  [facility] Pager:[phone]  If 7PM-7AM, please contact night-coverage [email or web address] Password [email or web address] 12/01/2014, 8:42 AM   LOS: 1 day

## 2014-12-02 ENCOUNTER — Encounter (HOSPITAL_COMMUNITY): Payer: Self-pay | Admitting: Surgery

## 2014-12-02 LAB — CBC WITH DIFFERENTIAL/PLATELET
Basophils Absolute: 0 10*3/uL (ref 0.0–0.1)
Basophils Relative: 0 % (ref 0–1)
Eosinophils Absolute: 0.1 10*3/uL (ref 0.0–0.7)
Eosinophils Relative: 1 % (ref 0–5)
HEMATOCRIT: 32.1 % — AB (ref 39.0–52.0)
HEMOGLOBIN: 10.7 g/dL — AB (ref 13.0–17.0)
LYMPHS ABS: 1.5 10*3/uL (ref 0.7–4.0)
LYMPHS PCT: 20 % (ref 12–46)
MCH: 33 pg (ref 26.0–34.0)
MCHC: 33.3 g/dL (ref 30.0–36.0)
MCV: 99.1 fL (ref 78.0–100.0)
MONO ABS: 0.5 10*3/uL (ref 0.1–1.0)
Monocytes Relative: 7 % (ref 3–12)
NEUTROS PCT: 72 % (ref 43–77)
Neutro Abs: 5.3 10*3/uL (ref 1.7–7.7)
Platelets: 122 10*3/uL — ABNORMAL LOW (ref 150–400)
RBC: 3.24 MIL/uL — ABNORMAL LOW (ref 4.22–5.81)
RDW: 12.9 % (ref 11.5–15.5)
WBC: 7.4 10*3/uL (ref 4.0–10.5)

## 2014-12-02 LAB — BASIC METABOLIC PANEL
Anion gap: 4 — ABNORMAL LOW (ref 5–15)
BUN: 20 mg/dL (ref 6–20)
CO2: 24 mmol/L (ref 22–32)
Calcium: 7.9 mg/dL — ABNORMAL LOW (ref 8.9–10.3)
Chloride: 107 mmol/L (ref 101–111)
Creatinine, Ser: 1.71 mg/dL — ABNORMAL HIGH (ref 0.61–1.24)
GFR calc Af Amer: 39 mL/min — ABNORMAL LOW (ref >60)
GFR, EST NON AFRICAN AMERICAN: 33 mL/min — AB (ref >60)
GLUCOSE: 130 mg/dL — AB (ref 65–99)
POTASSIUM: 4.1 mmol/L (ref 3.5–5.1)
Sodium: 135 mmol/L (ref 135–145)

## 2014-12-02 MED ORDER — SODIUM CHLORIDE 0.9 % IV SOLN
25.0000 mg | Freq: Three times a day (TID) | INTRAVENOUS | Status: DC | PRN
  Filled 2014-12-02: qty 1

## 2014-12-02 MED ORDER — CHLORPROMAZINE HCL 25 MG PO TABS: 25.0000 mg | ORAL_TABLET | Freq: Three times a day (TID) | ORAL | Status: DC | PRN

## 2014-12-02 NOTE — Progress Notes (Signed)
PATIENT DETAILS Name: [name] Age: 81 y.o. Sex: male Date of Birth: [birth year] Admit Date: 11/30/2014 Admitting Physician Alphonsa Overall, MD LAG:[id number] R, MD  Subjective: Complains of Pain at the operative site.NG tube in place. No BM/Flatus  Assessment/Plan: Active Problems: Small bowel perforation:secondary to a perforated jejunal diverticula. Underwent Laparoscopic lysis of adhesion and small bowel resection on 11/30/14.Continue Zosyn, Gen surgery managing. Await return of bowel-NG in place. Begin PT.  Acute renal failure: Likely secondary to prerenal azotemia due to above.Creatinine continues to stay elevated-continue IVF-follow  History of atrial fibrillation:Chads2vasc score of 4, reviewed prior outpatient neurology notes-given advanced age, frailty and gait instability not a candidate for anticoagulation.Given nothing by mouth status-hold Aggrenox.  History of dementia: At risk of delirium. Prn Haldol  Code Status:DNI  VTE prophylaxis:SQ Heparin  Hospitalist service will continue to follow.  Antimicrobial agents  See below  Anti-infectives    Start     Dose/Rate Route Frequency Ordered Stop   12/01/14 0100  piperacillin-tazobactam (ZOSYN) IVPB 3.375 g     3.375 g 12.5 mL/hr over 240 Minutes Intravenous Every 8 hours 11/30/14 1651     11/30/14 1645  piperacillin-tazobactam (ZOSYN) IVPB 3.375 g     3.375 g 100 mL/hr over 30 Minutes Intravenous  Once 11/30/14 1642 11/30/14 1722     Time spent 25 minutes-Greater than 50% of this time was spent in counseling, explanation of diagnosis, planning of further management, and coordination of care  MEDICATIONS: Scheduled Meds: . antiseptic oral rinse  7 mL Mouth Rinse q12n4p  . chlorhexidine  15 mL Mouth Rinse BID  . heparin  5,000 Units Subcutaneous 3 times per day  . metoprolol  5 mg Intravenous 3 times per day  . piperacillin-tazobactam (ZOSYN)  IV  3.375 g Intravenous Q8H   Continuous  Infusions: . dextrose 5 % and 0.45 % NaCl with KCl 20 mEq/L 125 mL/hr at 12/02/14 0730   PRN Meds:.chlorproMAZINE (THORAZINE) IV, haloperidol lactate, morphine injection, ondansetron **OR** ondansetron (ZOFRAN) IV  PHYSICAL EXAM: Vital signs in last 24 hours: Filed Vitals:   12/02/14 0400 12/02/14 0402 12/02/14 0600 12/02/14 0800  BP: 144/52  159/56   Pulse: 58  69   Temp:  98.8 F (37.1 C)  98.1 F (36.7 C)  TempSrc:  Oral  Oral  Resp: 17  24   Height:      Weight:      SpO2: 99%  99%     Weight change:  Filed Weights   11/30/14 1638 11/30/14 2300  Weight: 83.915 kg (185 lb) 87.1 kg (192 lb 0.3 oz)   Body mass index is 27.55 kg/(m^2).   Gen Exam: Awake and alert-pleasantly confused Neck: Supple, No JVD.   Chest: B/L Clear.   CVS: S1 S2 Regular, no murmurs.  Abdomen: soft, dressing in place-appropriately tender  Extremities: no edema, lower extremities warm to touch Neurologic: Non Focal.    Intake/Output from previous day:  Intake/Output Summary (Last 24 hours) at 12/02/14 1055 Last data filed at 12/02/14 0600  Gross per 24 hour  Intake   2220 ml  Output   2185 ml  Net     35 ml     LAB RESULTS: CBC  Recent Labs Lab 11/30/14 1150 12/01/14 0445 12/02/14 0350  WBC 10.0 7.8 7.4  HGB 12.3* 12.5* 10.7*  HCT 38.0* 38.4* 32.1*  PLT 147* 150 122*  MCV 99.0 98.7 99.1  MCH 32.0 32.1 33.0  MCHC 32.4 32.6 33.3  RDW 12.7 12.7 12.9  LYMPHSABS 1.3  --  1.5  MONOABS 0.6  --  0.5  EOSABS 0.0  --  0.1  BASOSABS 0.0  --  0.0    Chemistries   Recent Labs Lab 11/30/14 1150 12/01/14 0445 12/02/14 0350  NA 139 137 135  K 4.7 4.4 4.1  CL 109 107 107  CO2 25 23 24   GLUCOSE 108* 151* 130*  BUN 22* 20 20  CREATININE 1.69* 1.59* 1.71*  CALCIUM 8.5* 8.1* 7.9*    CBG: No results for input(s): GLUCAP in the last 168 hours.  GFR Estimated Creatinine Clearance: 29.1 mL/min (by C-G formula based on Cr of 1.71).  Coagulation profile No results for input(s):  INR, PROTIME in the last 168 hours.  Cardiac Enzymes No results for input(s): CKMB, TROPONINI, MYOGLOBIN in the last 168 hours.  Invalid input(s): CK  Invalid input(s): POCBNP No results for input(s): DDIMER in the last 72 hours. No results for input(s): HGBA1C in the last 72 hours. No results for input(s): CHOL, HDL, LDLCALC, TRIG, CHOLHDL, LDLDIRECT in the last 72 hours. No results for input(s): TSH, T4TOTAL, T3FREE, THYROIDAB in the last 72 hours.  Invalid input(s): FREET3 No results for input(s): VITAMINB12, FOLATE, FERRITIN, TIBC, IRON, RETICCTPCT in the last 72 hours.  Recent Labs  11/30/14 1150  LIPASE 22    Urine Studies No results for input(s): UHGB, CRYS in the last 72 hours.  Invalid input(s): UACOL, UAPR, USPG, UPH, UTP, UGL, UKET, UBIL, UNIT, UROB, ULEU, UEPI, UWBC, URBC, UBAC, CAST, UCOM, BILUA  MICROBIOLOGY: Recent Results (from the past 240 hour(s))  MRSA PCR Screening     Status: None   Collection Time: 11/30/14 11:07 PM  Result Value Ref Range Status   MRSA by PCR NEGATIVE NEGATIVE Final    Comment:        The GeneXpert MRSA Assay (FDA approved for NASAL specimens only), is one component of a comprehensive MRSA colonization surveillance program. It is not intended to diagnose MRSA infection nor to guide or monitor treatment for MRSA infections.     RADIOLOGY STUDIES/RESULTS: Ct Abdomen Pelvis W Contrast  11/30/2014   CLINICAL DATA:  Sudden onset of the lower abdominal pain this morning.  EXAM: CT ABDOMEN AND PELVIS WITH CONTRAST  TECHNIQUE: Multidetector CT imaging of the abdomen and pelvis was performed using the standard protocol following bolus administration of intravenous contrast.  CONTRAST:  67mL OMNIPAQUE IOHEXOL 300 MG/ML SOLN, 44mL OMNIPAQUE IOHEXOL 300 MG/ML SOLN  COMPARISON:  03/11/2008  FINDINGS: There is laxity of the anterior abdominal wall after hernia mesh repair. This has not appreciably changed.  Postcholecystectomy.  5 mm  hypodensity in the right lobe of the liver on image 22.  Spleen, pancreas, and adrenal glands are within normal limits.  Several low-density lesions are seen in both kidneys. There is 1 exophytic lesion measuring 1.4 cm in the left kidney with elevated Hounsfield unit measurements that has increased in size and today measures 1.4 cm. See image 29 of series 2.  Large duodenal diverticulum on image 26 is stable.  There are several small loculation of free intraperitoneal gas adjacent to left anterior small bowel loops on images 39 through 46. There is extensive stranding in the mesenteric fat. Tiny hyperdense foci within the mesenteric fat on image 42 are noted. They may represent tiny loculations of extraluminal contrast are possibly calcifications.  Extensive diverticulosis of the sigmoid colon is present.  There is subtle stranding within the fat adjacent to the sigmoid colon. This is probably non significant and may be related to the adjacent inflammatory process.  Bladder is decompressed.  Left inguinal hernia contains adipose tissue.  This is stable.  Multilevel degenerative disc disease in the lower thoracic and lumbar spine. No vertebral compression deformity.  IMPRESSION: There is free intraperitoneal gas and fatty inflammatory changes adjacent to small bowel loops compatible with a small bowel inflammatory process and rupture. There is no adjacent abscess. Critical Value/emergent results were called by telephone at the time of interpretation on 11/30/2014 at 2:17 pm to Dr. Verneda Skill, who verbally acknowledged these results.  Enlarging hyperdense lesion in the left kidney worrisome for neoplasm. MRI with contrast is recommended when feasible.   Electronically Signed   By: Marybelle Killings M.D.   On: 11/30/2014 14:17    Oren Binet, MD  [facility] Pager:[phone]  If 7PM-7AM, please contact night-coverage [email or web address] Password [email or web address] 12/02/2014, 10:55 AM   LOS: 2 days

## 2014-12-02 NOTE — Progress Notes (Signed)
2 Days Post-Op  Subjective: Pain appears to be well controlled.  Wife in room.    Objective: Vital signs in last 24 hours: Temp:  [98.1 F (36.7 C)-100.1 F (37.8 C)] 98.1 F (36.7 C) (06/20 0800) Pulse Rate:  [30-76] 69 (06/20 0600) Resp:  [15-24] 24 (06/20 0600) BP: (91-159)/(31-71) 159/56 mmHg (06/20 0600) SpO2:  [93 %-100 %] 99 % (06/20 0600) Last BM Date: 11/28/14  Intake/Output from previous day: 06/19 0701 - 06/20 0700 In: 2616.7 [I.V.:2346.7; NG/GT:120; IV Piggyback:150] Out: 2185 [Urine:985; Emesis/NG output:1200] Intake/Output this shift:    PE: General- In NAD Abdomen-soft, open area of wound is clean, trocar incisions are clean and intact with staples in, no bowel sounds  Lab Results:   Recent Labs  12/01/14 0445 12/02/14 0350  WBC 7.8 7.4  HGB 12.5* 10.7*  HCT 38.4* 32.1*  PLT 150 122*   BMET  Recent Labs  12/01/14 0445 12/02/14 0350  NA 137 135  K 4.4 4.1  CL 107 107  CO2 23 24  GLUCOSE 151* 130*  BUN 20 20  CREATININE 1.59* 1.71*  CALCIUM 8.1* 7.9*   PT/INR No results for input(s): LABPROT, INR in the last 72 hours. Comprehensive Metabolic Panel:    Component Value Date/Time   NA 135 12/02/2014 0350   NA 137 12/01/2014 0445   K 4.1 12/02/2014 0350   K 4.4 12/01/2014 0445   CL 107 12/02/2014 0350   CL 107 12/01/2014 0445   CO2 24 12/02/2014 0350   CO2 23 12/01/2014 0445   BUN 20 12/02/2014 0350   BUN 20 12/01/2014 0445   CREATININE 1.71* 12/02/2014 0350   CREATININE 1.59* 12/01/2014 0445   GLUCOSE 130* 12/02/2014 0350   GLUCOSE 151* 12/01/2014 0445   CALCIUM 7.9* 12/02/2014 0350   CALCIUM 8.1* 12/01/2014 0445   AST 25 11/30/2014 1150   AST 22 09/13/2008 0507   ALT 14* 11/30/2014 1150   ALT 16 09/13/2008 0507   ALKPHOS 52 11/30/2014 1150   ALKPHOS 64 09/13/2008 0507   BILITOT 1.2 11/30/2014 1150   BILITOT 1.8* 09/13/2008 0507   PROT 6.7 11/30/2014 1150   PROT 5.2* 09/13/2008 0507   ALBUMIN 3.7 11/30/2014 1150   ALBUMIN  3.0* 09/13/2008 0507     Studies/Results: Ct Abdomen Pelvis W Contrast  11/30/2014   CLINICAL DATA:  Sudden onset of the lower abdominal pain this morning.  EXAM: CT ABDOMEN AND PELVIS WITH CONTRAST  TECHNIQUE: Multidetector CT imaging of the abdomen and pelvis was performed using the standard protocol following bolus administration of intravenous contrast.  CONTRAST:  34mL OMNIPAQUE IOHEXOL 300 MG/ML SOLN, 76mL OMNIPAQUE IOHEXOL 300 MG/ML SOLN  COMPARISON:  03/11/2008  FINDINGS: There is laxity of the anterior abdominal wall after hernia mesh repair. This has not appreciably changed.  Postcholecystectomy.  5 mm hypodensity in the right lobe of the liver on image 22.  Spleen, pancreas, and adrenal glands are within normal limits.  Several low-density lesions are seen in both kidneys. There is 1 exophytic lesion measuring 1.4 cm in the left kidney with elevated Hounsfield unit measurements that has increased in size and today measures 1.4 cm. See image 29 of series 2.  Large duodenal diverticulum on image 26 is stable.  There are several small loculation of free intraperitoneal gas adjacent to left anterior small bowel loops on images 39 through 46. There is extensive stranding in the mesenteric fat. Tiny hyperdense foci within the mesenteric fat on image 42 are noted. They may  represent tiny loculations of extraluminal contrast are possibly calcifications.  Extensive diverticulosis of the sigmoid colon is present. There is subtle stranding within the fat adjacent to the sigmoid colon. This is probably non significant and may be related to the adjacent inflammatory process.  Bladder is decompressed.  Left inguinal hernia contains adipose tissue.  This is stable.  Multilevel degenerative disc disease in the lower thoracic and lumbar spine. No vertebral compression deformity.  IMPRESSION: There is free intraperitoneal gas and fatty inflammatory changes adjacent to small bowel loops compatible with a small bowel  inflammatory process and rupture. There is no adjacent abscess. Critical Value/emergent results were called by telephone at the time of interpretation on 11/30/2014 at 2:17 pm to Dr. Verneda Skill, who verbally acknowledged these results.  Enlarging hyperdense lesion in the left kidney worrisome for neoplasm. MRI with contrast is recommended when feasible.   Electronically Signed   By: Marybelle Killings M.D.   On: 11/30/2014 14:17    Anti-infectives: Anti-infectives    Start     Dose/Rate Route Frequency Ordered Stop   12/01/14 0100  piperacillin-tazobactam (ZOSYN) IVPB 3.375 g     3.375 g 12.5 mL/hr over 240 Minutes Intravenous Every 8 hours 11/30/14 1651     11/30/14 1645  piperacillin-tazobactam (ZOSYN) IVPB 3.375 g     3.375 g 100 mL/hr over 30 Minutes Intravenous  Once 11/30/14 1642 11/30/14 1722      Assessment Active Problems: 1.  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 Lucia Gaskins) 11/30/14  2.  Dementia, senile with delusions  3.  ARF (acute renal failure)-Cr up to 1.71 today  4.  ABL anemia       LOS: 2 days   Plan: Wait for return of bowel function.  Leave foley in given ARF.  Continue abxs.   Kaige Whistler Lenna Sciara 12/02/2014

## 2014-12-03 ENCOUNTER — Ambulatory Visit: Payer: Medicare Other | Admitting: Neurology

## 2014-12-03 ENCOUNTER — Telehealth: Payer: Self-pay | Admitting: Neurology

## 2014-12-03 LAB — BASIC METABOLIC PANEL
ANION GAP: 7 (ref 5–15)
BUN: 14 mg/dL (ref 6–20)
CALCIUM: 8 mg/dL — AB (ref 8.9–10.3)
CO2: 23 mmol/L (ref 22–32)
CREATININE: 1.51 mg/dL — AB (ref 0.61–1.24)
Chloride: 106 mmol/L (ref 101–111)
GFR calc Af Amer: 45 mL/min — ABNORMAL LOW (ref >60)
GFR calc non Af Amer: 39 mL/min — ABNORMAL LOW (ref >60)
Glucose, Bld: 123 mg/dL — ABNORMAL HIGH (ref 65–99)
Potassium: 4.1 mmol/L (ref 3.5–5.1)
SODIUM: 136 mmol/L (ref 135–145)

## 2014-12-03 LAB — CBC
HEMATOCRIT: 31.8 % — AB (ref 39.0–52.0)
Hemoglobin: 10.4 g/dL — ABNORMAL LOW (ref 13.0–17.0)
MCH: 31.3 pg (ref 26.0–34.0)
MCHC: 32.7 g/dL (ref 30.0–36.0)
MCV: 95.8 fL (ref 78.0–100.0)
PLATELETS: 132 10*3/uL — AB (ref 150–400)
RBC: 3.32 MIL/uL — ABNORMAL LOW (ref 4.22–5.81)
RDW: 12.3 % (ref 11.5–15.5)
WBC: 6.7 10*3/uL (ref 4.0–10.5)

## 2014-12-03 NOTE — Progress Notes (Signed)
PATIENT DETAILS Name: QUOC TOME Age: 81 y.o. Sex: male Date of Birth: [birth year] Admit Date: 11/30/2014 Admitting Physician Alphonsa Overall, MD UTM:[email or web address],WSFKCL R, MD  Subjective: No BM/Flatus yet. Lying comfortably  Assessment/Plan: Active Problems: Small bowel perforation:secondary to a perforated jejunal diverticula. Underwent Laparoscopic lysis of adhesion and small bowel resection on 11/30/14. Has postoperative ileus-awaiting return of bowel function, NG tube remains in place. General surgery following and managing.  Acute renal failure: Likely secondary to prerenal azotemia due to above.Creatinine slowly improving, continue IV fluids. Okay to discontinue Foley catheter from my point of view. Follow lytes.  History of atrial fibrillation:Chads2vasc score of 4, reviewed prior outpatient neurology notes-given advanced age, frailty and gait instability not a candidate for anticoagulation.Given nothing by mouth status-hold Aggrenox-and resume when oral intake has resumed.Marland Kitchen  History of dementia: At risk of delirium. Prn Haldol  Code Status:DNI  VTE prophylaxis:SQ Heparin  Hospitalist service will continue to follow.  Antimicrobial agents  See below  Anti-infectives    Start     Dose/Rate Route Frequency Ordered Stop   12/01/14 0100  piperacillin-tazobactam (ZOSYN) IVPB 3.375 g     3.375 g 12.5 mL/hr over 240 Minutes Intravenous Every 8 hours 11/30/14 1651     11/30/14 1645  piperacillin-tazobactam (ZOSYN) IVPB 3.375 g     3.375 g 100 mL/hr over 30 Minutes Intravenous  Once 11/30/14 1642 11/30/14 1722     Time spent 25 minutes-Greater than 50% of this time was spent in counseling, explanation of diagnosis, planning of further management, and coordination of care  MEDICATIONS: Scheduled Meds: . antiseptic oral rinse  7 mL Mouth Rinse q12n4p  . chlorhexidine  15 mL Mouth Rinse BID  . heparin  5,000 Units Subcutaneous 3 times per day  . metoprolol  5  mg Intravenous 3 times per day  . piperacillin-tazobactam (ZOSYN)  IV  3.375 g Intravenous Q8H   Continuous Infusions: . dextrose 5 % and 0.45 % NaCl with KCl 20 mEq/L 1,000 mL (12/03/14 0537)   PRN Meds:.chlorproMAZINE (THORAZINE) IV, haloperidol lactate, morphine injection, ondansetron **OR** ondansetron (ZOFRAN) IV  PHYSICAL EXAM: Vital signs in last 24 hours: Filed Vitals:   12/03/14 0600 12/03/14 0700 12/03/14 0800 12/03/14 0900  BP: 159/47 157/48 141/43 139/48  Pulse: 63 63 68 62  Temp:   99.1 F (37.3 C)   TempSrc:   Oral   Resp: 21 27 23 18   Height:      Weight:      SpO2: 94% 94% 95% 95%    Weight change:  Filed Weights   11/30/14 1638 11/30/14 2300 12/03/14 0400  Weight: 83.915 kg (185 lb) 87.1 kg (192 lb 0.3 oz) 86.2 kg (190 lb 0.6 oz)   Body mass index is 27.27 kg/(m^2).   Gen Exam: Awake and alert-pleasantly confused Neck: Supple, No JVD.   Chest: B/L Clear.   CVS: S1 S2 Regular, no murmurs.  Abdomen: soft, dressing in place-appropriately tender  Extremities: no edema, lower extremities warm to touch Neurologic: Non Focal.    Intake/Output from previous day:  Intake/Output Summary (Last 24 hours) at 12/03/14 0944 Last data filed at 12/03/14 0938  Gross per 24 hour  Intake   3445 ml  Output   3270 ml  Net    175 ml     LAB RESULTS: CBC  Recent Labs Lab 11/30/14 1150 12/01/14 0445 12/02/14 0350 12/03/14 0350  WBC 10.0 7.8  7.4 6.7  HGB 12.3* 12.5* 10.7* 10.4*  HCT 38.0* 38.4* 32.1* 31.8*  PLT 147* 150 122* 132*  MCV 99.0 98.7 99.1 95.8  MCH 32.0 32.1 33.0 31.3  MCHC 32.4 32.6 33.3 32.7  RDW 12.7 12.7 12.9 12.3  LYMPHSABS 1.3  --  1.5  --   MONOABS 0.6  --  0.5  --   EOSABS 0.0  --  0.1  --   BASOSABS 0.0  --  0.0  --     Chemistries   Recent Labs Lab 11/30/14 1150 12/01/14 0445 12/02/14 0350 12/03/14 0350  NA 139 137 135 136  K 4.7 4.4 4.1 4.1  CL 109 107 107 106  CO2 25 23 24 23   GLUCOSE 108* 151* 130* 123*  BUN 22* 20 20  14   CREATININE 1.69* 1.59* 1.71* 1.51*  CALCIUM 8.5* 8.1* 7.9* 8.0*    CBG: No results for input(s): GLUCAP in the last 168 hours.  GFR Estimated Creatinine Clearance: 32.9 mL/min (by C-G formula based on Cr of 1.51).  Coagulation profile No results for input(s): INR, PROTIME in the last 168 hours.  Cardiac Enzymes No results for input(s): CKMB, TROPONINI, MYOGLOBIN in the last 168 hours.  Invalid input(s): CK  Invalid input(s): POCBNP No results for input(s): DDIMER in the last 72 hours. No results for input(s): HGBA1C in the last 72 hours. No results for input(s): CHOL, HDL, LDLCALC, TRIG, CHOLHDL, LDLDIRECT in the last 72 hours. No results for input(s): TSH, T4TOTAL, T3FREE, THYROIDAB in the last 72 hours.  Invalid input(s): FREET3 No results for input(s): VITAMINB12, FOLATE, FERRITIN, TIBC, IRON, RETICCTPCT in the last 72 hours.  Recent Labs  11/30/14 1150  LIPASE 22    Urine Studies No results for input(s): UHGB, CRYS in the last 72 hours.  Invalid input(s): UACOL, UAPR, USPG, UPH, UTP, UGL, UKET, UBIL, UNIT, UROB, ULEU, UEPI, UWBC, URBC, UBAC, CAST, UCOM, BILUA  MICROBIOLOGY: Recent Results (from the past 240 hour(s))  MRSA PCR Screening     Status: None   Collection Time: 11/30/14 11:07 PM  Result Value Ref Range Status   MRSA by PCR NEGATIVE NEGATIVE Final    Comment:        The GeneXpert MRSA Assay (FDA approved for NASAL specimens only), is one component of a comprehensive MRSA colonization surveillance program. It is not intended to diagnose MRSA infection nor to guide or monitor treatment for MRSA infections.     RADIOLOGY STUDIES/RESULTS: Ct Abdomen Pelvis W Contrast  11/30/2014   CLINICAL DATA:  Sudden onset of the lower abdominal pain this morning.  EXAM: CT ABDOMEN AND PELVIS WITH CONTRAST  TECHNIQUE: Multidetector CT imaging of the abdomen and pelvis was performed using the standard protocol following bolus administration of intravenous  contrast.  CONTRAST:  67mL OMNIPAQUE IOHEXOL 300 MG/ML SOLN, 49mL OMNIPAQUE IOHEXOL 300 MG/ML SOLN  COMPARISON:  03/11/2008  FINDINGS: There is laxity of the anterior abdominal wall after hernia mesh repair. This has not appreciably changed.  Postcholecystectomy.  5 mm hypodensity in the right lobe of the liver on image 22.  Spleen, pancreas, and adrenal glands are within normal limits.  Several low-density lesions are seen in both kidneys. There is 1 exophytic lesion measuring 1.4 cm in the left kidney with elevated Hounsfield unit measurements that has increased in size and today measures 1.4 cm. See image 29 of series 2.  Large duodenal diverticulum on image 26 is stable.  There are several small loculation of free intraperitoneal gas  adjacent to left anterior small bowel loops on images 39 through 46. There is extensive stranding in the mesenteric fat. Tiny hyperdense foci within the mesenteric fat on image 42 are noted. They may represent tiny loculations of extraluminal contrast are possibly calcifications.  Extensive diverticulosis of the sigmoid colon is present. There is subtle stranding within the fat adjacent to the sigmoid colon. This is probably non significant and may be related to the adjacent inflammatory process.  Bladder is decompressed.  Left inguinal hernia contains adipose tissue.  This is stable.  Multilevel degenerative disc disease in the lower thoracic and lumbar spine. No vertebral compression deformity.  IMPRESSION: There is free intraperitoneal gas and fatty inflammatory changes adjacent to small bowel loops compatible with a small bowel inflammatory process and rupture. There is no adjacent abscess. Critical Value/emergent results were called by telephone at the time of interpretation on 11/30/2014 at 2:17 pm to Dr. Verneda Skill, who verbally acknowledged these results.  Enlarging hyperdense lesion in the left kidney worrisome for neoplasm. MRI with contrast is recommended when feasible.    Electronically Signed   By: Marybelle Killings M.D.   On: 11/30/2014 14:17    Oren Binet, MD  [facility] Pager:[phone]  If 7PM-7AM, please contact night-coverage [email or web address] Password [facility] 12/03/2014, 9:44 AM   LOS: 3 days

## 2014-12-03 NOTE — Progress Notes (Signed)
Physical Therapy Treatment Patient Details Name: [name] MRN: [id number] DOB: 1920-12-10 Today's Date: 12/03/2014    History of Present Illness spt adm 11/30/14 secondary to a perforated jejunal diverticula. Underwent Laparoscopic lysis of adhesion and small bowel resection on 11/30/14. PMHx: abd surgeries, dementia, CVA, afib    PT Comments    Pt participated well with session. Continuing to require +2 assist for mobility. Family present and assisted during session. May need to consider ST rehab placement-pt still requiring a significant amount of assistance.   Follow Up Recommendations  Home health PT;Supervision/Assistance - 24 hour (depending on progress. Pt still requiring a lot of assistance. may need to consider SNF placement)     Equipment Recommendations  None recommended by PT    Recommendations for Other Services       Precautions / Restrictions Precautions Precautions: Fall Restrictions Weight Bearing Restrictions: No    Mobility  Bed Mobility Overal bed mobility: Needs Assistance Bed Mobility: Rolling;Sidelying to Sit Rolling: Min assist Sidelying to sit: Mod assist;+2 for physical assistance;+2 for safety/equipment       General bed mobility comments: cues for technique, incr time, . Assist for trunk and bil LEs.   Transfers Overall transfer level: Needs assistance Equipment used: Rolling walker (2 wheeled) Transfers: Sit to/from Stand Sit to Stand: Mod assist;+2 physical assistance;+2 safety/equipment;From elevated surface         General transfer comment: 2 attempts to stand; noted shakiness but pt has baseline tremors. Assist to rise, stabilize, control descent. Multimodal cues for safety, technique, hand placement  Ambulation/Gait Ambulation/Gait assistance: Mod assist;+2 physical assistance;+2 safety/equipment Ambulation Distance (Feet): 65 Feet (x2) Assistive device: Rolling walker (2 wheeled) Gait Pattern/deviations: Step-through  pattern;Trunk flexed;Drifts right/left;Decreased stride length     General Gait Details: Assist to stabilize pt and maneuver with RW. Several standing rest breaks to allow for O2 sat recovery. Sats fluctuated between 70-90s%-unsure of accuracy of monitor. Seated rest break as well   Stairs            Wheelchair Mobility    Modified Rankin (Stroke Patients Only)       Balance           Standing balance support: Bilateral upper extremity supported Standing balance-Leahy Scale: Poor Standing balance comment: needs RW                    Cognition Arousal/Alertness: Awake/alert Behavior During Therapy: WFL for tasks assessed/performed Overall Cognitive Status: Within Functional Limits for tasks assessed                      Exercises      General Comments        Pertinent Vitals/Pain Pain Assessment: Faces Faces Pain Scale: Hurts little more Pain Location: abdomen Pain Descriptors / Indicators: Sore Pain Intervention(s): Monitored during session;Repositioned    Home Living                      Prior Function            PT Goals (current goals can now be found in the care plan section) Progress towards PT goals: Progressing toward goals    Frequency  Min 3X/week    PT Plan Current plan remains appropriate    Co-evaluation             End of Session   Activity Tolerance: Patient tolerated treatment well Patient left: in chair;with call bell/phone within reach;with  family/visitor present     Time: 0539-7673 PT Time Calculation (min) (ACUTE ONLY): 34 min  Charges:  $Gait Training: 23-37 mins                    G Codes:      Weston Anna, MPT Pager: [phone]

## 2014-12-03 NOTE — Progress Notes (Signed)
3 Days Post-Op  Subjective: No flatus or BM.  Daughter in room.    Objective: Vital signs in last 24 hours: Temp:  [98.1 F (36.7 C)-98.8 F (37.1 C)] 98.5 F (36.9 C) (06/21 0400) Pulse Rate:  [31-100] 63 (06/21 0700) Resp:  [13-27] 27 (06/21 0700) BP: (122-173)/(38-65) 157/48 mmHg (06/21 0700) SpO2:  [86 %-96 %] 94 % (06/21 0700) Weight:  [86.2 kg (190 lb 0.6 oz)] 86.2 kg (190 lb 0.6 oz) (06/21 0400) Last BM Date: 11/28/14  Intake/Output from previous day: 06/20 0701 - 06/21 0700 In: 3357.5 [I.V.:3087.5; NG/GT:60; IV Piggyback:150] Out: 3050 [Urine:2700; Emesis/NG output:350] Intake/Output this shift:    PE: General- In NAD Abdomen-soft, open area of wound is clean, trocar incisions are clean and intact with staples in, no bowel sounds  Lab Results:   Recent Labs  12/02/14 0350 12/03/14 0350  WBC 7.4 6.7  HGB 10.7* 10.4*  HCT 32.1* 31.8*  PLT 122* 132*   BMET  Recent Labs  12/02/14 0350 12/03/14 0350  NA 135 136  K 4.1 4.1  CL 107 106  CO2 24 23  GLUCOSE 130* 123*  BUN 20 14  CREATININE 1.71* 1.51*  CALCIUM 7.9* 8.0*   PT/INR No results for input(s): LABPROT, INR in the last 72 hours. Comprehensive Metabolic Panel:    Component Value Date/Time   NA 136 12/03/2014 0350   NA 135 12/02/2014 0350   K 4.1 12/03/2014 0350   K 4.1 12/02/2014 0350   CL 106 12/03/2014 0350   CL 107 12/02/2014 0350   CO2 23 12/03/2014 0350   CO2 24 12/02/2014 0350   BUN 14 12/03/2014 0350   BUN 20 12/02/2014 0350   CREATININE 1.51* 12/03/2014 0350   CREATININE 1.71* 12/02/2014 0350   GLUCOSE 123* 12/03/2014 0350   GLUCOSE 130* 12/02/2014 0350   CALCIUM 8.0* 12/03/2014 0350   CALCIUM 7.9* 12/02/2014 0350   AST 25 11/30/2014 1150   AST 22 09/13/2008 0507   ALT 14* 11/30/2014 1150   ALT 16 09/13/2008 0507   ALKPHOS 52 11/30/2014 1150   ALKPHOS 64 09/13/2008 0507   BILITOT 1.2 11/30/2014 1150   BILITOT 1.8* 09/13/2008 0507   PROT 6.7 11/30/2014 1150   PROT 5.2*  09/13/2008 0507   ALBUMIN 3.7 11/30/2014 1150   ALBUMIN 3.0* 09/13/2008 0507     Studies/Results: No results found.  Anti-infectives: Anti-infectives    Start     Dose/Rate Route Frequency Ordered Stop   12/01/14 0100  piperacillin-tazobactam (ZOSYN) IVPB 3.375 g     3.375 g 12.5 mL/hr over 240 Minutes Intravenous Every 8 hours 11/30/14 1651     11/30/14 1645  piperacillin-tazobactam (ZOSYN) IVPB 3.375 g     3.375 g 100 mL/hr over 30 Minutes Intravenous  Once 11/30/14 1642 11/30/14 1722      Assessment Active Problems: 1.  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 Lucia Gaskins) 11/30/14-has a postop ileus  2.  Dementia, senile with delusions  3.  ARF (acute renal failure)-Cr down to 1.51 today  4.  ABL anemia       LOS: 3 days   Plan: Wait for return of bowel function.  Continue abxs.   Edmon Magid Lenna Sciara 12/03/2014

## 2014-12-03 NOTE — Telephone Encounter (Signed)
thanks

## 2014-12-03 NOTE — Progress Notes (Addendum)
ANTIBIOTIC CONSULT NOTE - FOLLOW UP  Pharmacy Consult for Zosyn Indication: intra-abdominal infection  Allergies  Allergen Reactions  . Topamax [Topiramate]     Patient Measurements: Height: 5\' 10"  (177.8 cm) Weight: 190 lb 0.6 oz (86.2 kg) IBW/kg (Calculated) : 73  Vital Signs: Temp: 99.7 F (37.6 C) (06/21 1200) Temp Source: Oral (06/21 1200) BP: 131/68 mmHg (06/21 1300) Pulse Rate: 70 (06/21 1300) Intake/Output from previous day: 06/20 0701 - 06/21 0700 In: 3357.5 [I.V.:3087.5; NG/GT:60; IV Piggyback:150] Out: 0762 [Urine:2700; Emesis/NG output:350] Intake/Output from this shift: Total I/O In: 300 [I.V.:250; IV Piggyback:50] Out: 720 [Urine:720]  Labs:  Recent Labs  12/01/14 0445 12/02/14 0350 12/03/14 0350  WBC 7.8 7.4 6.7  HGB 12.5* 10.7* 10.4*  PLT 150 122* 132*  CREATININE 1.59* 1.71* 1.51*   Estimated Creatinine Clearance: 32.9 mL/min (by C-G formula based on Cr of 1.51). No results for input(s): VANCOTROUGH, VANCOPEAK, VANCORANDOM, GENTTROUGH, GENTPEAK, GENTRANDOM, TOBRATROUGH, TOBRAPEAK, TOBRARND, AMIKACINPEAK, AMIKACINTROU, AMIKACIN in the last 72 hours.   Microbiology: Recent Results (from the past 720 hour(s))  MRSA PCR Screening     Status: None   Collection Time: 11/30/14 11:07 PM  Result Value Ref Range Status   MRSA by PCR NEGATIVE NEGATIVE Final    Comment:        The GeneXpert MRSA Assay (FDA approved for NASAL specimens only), is one component of a comprehensive MRSA colonization surveillance program. It is not intended to diagnose MRSA infection nor to guide or monitor treatment for MRSA infections.     Anti-infectives    Start     Dose/Rate Route Frequency Ordered Stop   12/01/14 0100  piperacillin-tazobactam (ZOSYN) IVPB 3.375 g     3.375 g 12.5 mL/hr over 240 Minutes Intravenous Every 8 hours 11/30/14 1651     11/30/14 1645  piperacillin-tazobactam (ZOSYN) IVPB 3.375 g     3.375 g 100 mL/hr over 30 Minutes Intravenous   Once 11/30/14 1642 11/30/14 1722      Assessment: 41 yoM with PMH significant for prior stroke, essential tremor, PAF, arthritis, dementia, presenting to the ED for abdominal pain, found to have perforated jejunal diverticula. Underwent Laparoscopic lysis of adhesion and small bowel resection on 11/30/14.  Pharmacy dosing Zosyn for intra-abdominal infection.  6/18 >> Zosyn  >>  Temp: 99.7 WBC: wnl Renal: SCr improved; CrCl 33 CG (baseline unk)  No cultures  Dose changes/levels:    Goal of Therapy:  Eradication of infection Appropriate antibiotic dosing for indication and renal function  Plan:  Day 4 antibiotics Zosyn 3.375 g IV given every 8 hrs by 4-hr infusion  Follow clinical course, renal function, culture results as available  Follow for de-escalation of antibiotics and LOT   Reuel Boom, PharmD Pager: [phone] 12/03/2014, 2:32 PM

## 2014-12-03 NOTE — Telephone Encounter (Signed)
Patient's wife called stating he has been admitted to [facility] on Saturday for obstructive bowel surgery  and is currently not taking namenda, aricept and aggrenox. She states he not as confused as with previous surgeries. She was calling to give update. She can be reached at [phone].

## 2014-12-03 NOTE — Telephone Encounter (Signed)
Dr Leonie Man,  For your information.  [name]

## 2014-12-04 DIAGNOSIS — N179 Acute kidney failure, unspecified: Secondary | ICD-10-CM

## 2014-12-04 DIAGNOSIS — D696 Thrombocytopenia, unspecified: Secondary | ICD-10-CM

## 2014-12-04 DIAGNOSIS — K631 Perforation of intestine (nontraumatic): Principal | ICD-10-CM

## 2014-12-04 DIAGNOSIS — F0391 Unspecified dementia with behavioral disturbance: Secondary | ICD-10-CM

## 2014-12-04 LAB — BASIC METABOLIC PANEL
ANION GAP: 6 (ref 5–15)
BUN: 11 mg/dL (ref 6–20)
CALCIUM: 8.3 mg/dL — AB (ref 8.9–10.3)
CHLORIDE: 106 mmol/L (ref 101–111)
CO2: 25 mmol/L (ref 22–32)
Creatinine, Ser: 1.5 mg/dL — ABNORMAL HIGH (ref 0.61–1.24)
GFR calc non Af Amer: 39 mL/min — ABNORMAL LOW (ref >60)
GFR, EST AFRICAN AMERICAN: 45 mL/min — AB (ref >60)
Glucose, Bld: 120 mg/dL — ABNORMAL HIGH (ref 65–99)
Potassium: 4.1 mmol/L (ref 3.5–5.1)
SODIUM: 137 mmol/L (ref 135–145)

## 2014-12-04 LAB — CBC
HCT: 33.9 % — ABNORMAL LOW (ref 39.0–52.0)
Hemoglobin: 11.2 g/dL — ABNORMAL LOW (ref 13.0–17.0)
MCH: 32 pg (ref 26.0–34.0)
MCHC: 33 g/dL (ref 30.0–36.0)
MCV: 96.9 fL (ref 78.0–100.0)
Platelets: 165 10*3/uL (ref 150–400)
RBC: 3.5 MIL/uL — ABNORMAL LOW (ref 4.22–5.81)
RDW: 12.5 % (ref 11.5–15.5)
WBC: 6.7 10*3/uL (ref 4.0–10.5)

## 2014-12-04 NOTE — Progress Notes (Signed)
4 Days Post-Op  Subjective: He is lying in bed not saying much.  His wife does most of the talking.  She is very aware and alert to his needs.  Knows his medicines and why he is on them.   Objective: Vital signs in last 24 hours: Temp:  [98.3 F (36.8 C)-99.7 F (37.6 C)] 98.3 F (36.8 C) (06/22 0800) Pulse Rate:  [28-83] 67 (06/22 0700) Resp:  [14-27] 15 (06/22 0700) BP: (124-175)/(42-91) 163/51 mmHg (06/22 0700) SpO2:  [91 %-99 %] 94 % (06/22 0700) Last BM Date: 11/28/14 NG 900, NPO Afebrile, TM 99.3 VSS Creatinine 1.5, stable Glucose borderline elevated WBC 6.7 Intake/Output from previous day: 06/21 0701 - 06/22 0700 In: 2709.2 [I.V.:2469.2; NG/GT:60; IV Piggyback:150] Out: 5456 [Urine:3070; Emesis/NG output:900] Intake/Output this shift: Total I/O In: 150 [I.V.:100; IV Piggyback:50] Out: -   General appearance: alert, cooperative, no distress and will answer a few questions, doesn't really engage in conversation. Resp: clear to auscultation bilaterally and anterior exam. GI: soft, no bowel sounds, open wound looks fine, NG working.  Lab Results:   Recent Labs  12/03/14 0350 12/04/14 0350  WBC 6.7 6.7  HGB 10.4* 11.2*  HCT 31.8* 33.9*  PLT 132* 165    BMET  Recent Labs  12/03/14 0350 12/04/14 0350  NA 136 137  K 4.1 4.1  CL 106 106  CO2 23 25  GLUCOSE 123* 120*  BUN 14 11  CREATININE 1.51* 1.50*  CALCIUM 8.0* 8.3*   PT/INR No results for input(s): LABPROT, INR in the last 72 hours.   Recent Labs Lab 11/30/14 1150  AST 25  ALT 14*  ALKPHOS 52  BILITOT 1.2  PROT 6.7  ALBUMIN 3.7     Lipase     Component Value Date/Time   LIPASE 22 11/30/2014 1150     Studies/Results: No results found.  Medications: . antiseptic oral rinse  7 mL Mouth Rinse q12n4p  . chlorhexidine  15 mL Mouth Rinse BID  . heparin  5,000 Units Subcutaneous 3 times per day  . metoprolol  5 mg Intravenous 3 times per day  . piperacillin-tazobactam (ZOSYN)  IV   3.375 g Intravenous Q8H   . dextrose 5 % and 0.45 % NaCl with KCl 20 mEq/L 1,000 mL (12/04/14 0019)   Prior to Admission medications   Medication Sig Start Date End Date Taking? Authorizing Provider  dipyridamole-aspirin (AGGRENOX) 200-25 MG per 12 hr capsule Take 1 capsule by mouth 2 (two) times daily.   Yes Historical Provider, MD  donepezil (ARICEPT) 23 MG TABS tablet TAKE 1 TABLET BY MOUTH AT BEDTIME 11/03/14  Yes Garvin Fila, MD  fexofenadine (ALLEGRA) 180 MG tablet Take 180 mg by mouth daily.   Yes Historical Provider, MD  fluticasone (FLONASE) 50 MCG/ACT nasal spray Place 2 sprays into the nose at bedtime. 01/01/13  Yes Historical Provider, MD  memantine (NAMENDA) 10 MG tablet Take 10 mg by mouth 2 (two) times daily. 10/22/14  Yes Historical Provider, MD  Multiple Vitamins-Minerals (ICAPS LUTEIN & ZEAXANTHIN) TBEC Take 1 tablet by mouth daily.   Yes Historical Provider, MD  multivitamin-iron-minerals-folic acid (CENTRUM) chewable tablet Chew 1 tablet by mouth daily.   Yes Historical Provider, MD  propranolol ER (INDERAL LA) 60 MG 24 hr capsule Take 1 capsule (60 mg total) by mouth daily. 02/19/13  Yes Philmore Pali, NP  vitamin E 400 UNIT capsule Take 400 Units by mouth as directed.   Yes Historical Provider, MD  QUEtiapine (SEROQUEL)  25 MG tablet TAKE ONE-HALF (1/2) TABLET BY MOUTH AT BEDTIME Patient not taking: Reported on 11/30/2014 09/11/14   Garvin Fila, MD     Assessment/Plan 1.   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 Extensive small bowel adhesions. Mesh right upper abdomen. Hernia defects below mesh and in midline S/p Diagnostic laparoscopy, Laparoscopic enterolysis of adhesions for 100 minutes, open open small bowel resection, 11/30/14, Dr. Alphonsa Overall  POD# 4 2.  S/p Right hemicolectomy 09/12/08 Dr. Brantley Stage, contained jejunal diverticular perforation 04/04/09 [place],  History of ventral hernia repair with polyester mesh (Parietex), upper abdomen - 2005 - [facility] 3.  Sx  of SVT 4.  Hx of left kidney mass 5.  Hx of CVA 6.  Hx of memory loss 7.  Renal insuffiencey  8.  Antibiotics:  Day 5 Zosyn 9.  DVT:  Heparin/SCD  Plan:  He walks at home with walker, I will get OT and PT involved, work on mobilizing.  He was sick only a couple hours before wife brought him to the ED.  Continue hydration and NPO til bowel wakes up.  Encourage IS more frequently.    LOS: 4 days    [name] 12/04/2014

## 2014-12-04 NOTE — Progress Notes (Signed)
Date:  December 03, 2014 U.R. performed for needs and level of care. POD 4 open abd wound, ileus, npo, ng tube to suction, mimally responsive. Temp 99.7. Will continue to follow for Case Management needs.  Velva Harman, RN, BSN, [place]   [phone]

## 2014-12-04 NOTE — Progress Notes (Addendum)
Patient ID: [name], male   DOB: 1921-06-04, 81 y.o.   MRN: [id number] [facility] PROGRESS NOTE  [name] HUD:[id number] DOB: Feb 28, 1921 DOA: 11/30/2014 PCP: Simona Huh, MD  Brief narrative:    81 y.o. male with a past medical history of dementia, CVA, paroxysmal atrial fibrillation on full dose anticoagulation with pradaxa. He presented to [facility] ED with reports of abdominal pain and has had near syncope. He was seen in urgent care center and was found to have small bowel perforation. Pt is status post diagnostic laparoscopy with enterolysis of adhesions and small bowel resection done 11/30/2014 by Dr. Lucia Gaskins.  Barrier to discharge: requires NG tube for now. Appreciate surgery following. Able to transfer to telemetry floor today.   Assessment/Plan:    Principal Problem: Small bowel perforation / postoperative ileus  - Secondary to adhesions - Status post diagnostic laparoscopy with enterolysis of adhesions and small bowel resection done 11/30/2014 by Dr. Lucia Gaskins - Started on zosyn from the time of the admission, continue zosyn for now - He still requires NG tube at this time, bowels still sluggish - Continue NPO - Continue IV fluids   Active Problems: Acute renal failure - Likely prerenal from GI losses, NG tube - Baseline Cr WNL - On this admission creatinine 1.68 - Continue IV fluids - Follow up renal function in am   Normocytic anemia - Hemoglobin stable at 11.2 - No current indications for transfusion.  History of atrial fibrillation - CHADS2 vasc score of 4 - Will resume aggrenox once patient able to tolerate PO intake  - Not a candidate for [facility] due to age and comorbidities  - Rate controlled with low dose metoprolol   Dementia / Depression - Continue Haldol PRN - Resume Aricept and Namenda once PO intake improves  Thrombocytopenia - Mild and resolved by now  DVT Prophylaxis  - Heparin subQ ordered    Code Status: Partial code  Family  Communication:  plan of care discussed with the patient's wife at the bedside  Disposition Plan: transfer to telemetry floor today 12/04/2014   IV access:  Peripheral IV  Procedures and diagnostic studies:    Ct Abdomen Pelvis W Contrast 11/30/2014   There is free intraperitoneal gas and fatty inflammatory changes adjacent to small bowel loops compatible with a small bowel inflammatory process and rupture. There is no adjacent abscess. Critical Value/emergent results were called by telephone at the time of interpretation on 11/30/2014 at 2:17 pm to Dr. Verneda Skill, who verbally acknowledged these results.  Enlarging hyperdense lesion in the left kidney worrisome for neoplasm. MRI with contrast is recommended when feasible.     Medical Consultants:  Surgery   Other Consultants:  Physical therapy   IAnti-Infectives:   Zosyn 11/30/2014 -->   Leisa Lenz, MD  [facility] Pager [phone]  Time spent in minutes: 25 minutes  If 7PM-7AM, please contact night-coverage [email or web address] Password [facility] 12/04/2014, 10:46 AM   LOS: 4 days    HPI/Subjective: No acute overnight events. Patient reports no vomiting.   Objective: Filed Vitals:   12/04/14 0500 12/04/14 0600 12/04/14 0700 12/04/14 0800  BP: 166/61 162/50 163/51 158/57  Pulse: 83 28 67 62  Temp:    98.3 F (36.8 C)  TempSrc:    Oral  Resp: 26 15 15 17   Height:      Weight:      SpO2: 91% 95% 94% 94%    Intake/Output Summary (Last 24 hours) at 12/04/14 1046 Last data filed  at 12/04/14 0900  Gross per 24 hour  Intake 2659.17 ml  Output   3650 ml  Net -990.83 ml    Exam:   General:  Pt is alert, follows commands appropriately, not in acute distress  Cardiovascular: Regular rate and rhythm, S1/S2 (+)  Respiratory: Clear to auscultation bilaterally, no wheezing, no crackles, no rhonchi  Abdomen: Soft, non tender, non distended; (+) NG tube in place  Extremities: No edema, pulses DP and PT palpable  bilaterally  Neuro: Grossly nonfocal  Data Reviewed: Basic Metabolic Panel:  Recent Labs Lab 11/30/14 1150 12/01/14 0445 12/02/14 0350 12/03/14 0350 12/04/14 0350  NA 139 137 135 136 137  K 4.7 4.4 4.1 4.1 4.1  CL 109 107 107 106 106  CO2 25 23 24 23 25   GLUCOSE 108* 151* 130* 123* 120*  BUN 22* 20 20 14 11   CREATININE 1.69* 1.59* 1.71* 1.51* 1.50*  CALCIUM 8.5* 8.1* 7.9* 8.0* 8.3*   Liver Function Tests:  Recent Labs Lab 11/30/14 1150  AST 25  ALT 14*  ALKPHOS 52  BILITOT 1.2  PROT 6.7  ALBUMIN 3.7    Recent Labs Lab 11/30/14 1150  LIPASE 22   No results for input(s): AMMONIA in the last 168 hours. CBC:  Recent Labs Lab 11/30/14 1150 12/01/14 0445 12/02/14 0350 12/03/14 0350 12/04/14 0350  WBC 10.0 7.8 7.4 6.7 6.7  NEUTROABS 8.1*  --  5.3  --   --   HGB 12.3* 12.5* 10.7* 10.4* 11.2*  HCT 38.0* 38.4* 32.1* 31.8* 33.9*  MCV 99.0 98.7 99.1 95.8 96.9  PLT 147* 150 122* 132* 165   Cardiac Enzymes: No results for input(s): CKTOTAL, CKMB, CKMBINDEX, TROPONINI in the last 168 hours. BNP: Invalid input(s): POCBNP CBG: No results for input(s): GLUCAP in the last 168 hours.  Recent Results (from the past 240 hour(s))  MRSA PCR Screening     Status: None   Collection Time: 11/30/14 11:07 PM  Result Value Ref Range Status   MRSA by PCR NEGATIVE NEGATIVE Final     Scheduled Meds: . antiseptic oral rinse  7 mL Mouth Rinse q12n4p  . chlorhexidine  15 mL Mouth Rinse BID  . heparin  5,000 Units Subcutaneous 3 times per day  . metoprolol  5 mg Intravenous 3 times per day  . piperacillin-tazobactam (ZOSYN)  IV  3.375 g Intravenous Q8H   Continuous Infusions: . dextrose 5 % and 0.45 % NaCl with KCl 20 mEq/L 1,000 mL (12/04/14 0019)

## 2014-12-05 DIAGNOSIS — F0390 Unspecified dementia without behavioral disturbance: Secondary | ICD-10-CM

## 2014-12-05 NOTE — Progress Notes (Signed)
Occupational Therapy Evaluation Patient Details Name: [name] MRN: [id number] DOB: August 16, 1920 Today's Date: 12/05/2014    History of Present Illness spt adm 11/30/14 secondary to a perforated jejunal diverticula. Underwent Laparoscopic lysis of adhesion and small bowel resection on 11/30/14. PMHx: abd surgeries, dementia, CVA, afib   Clinical Impression   Pt admitted with diverticula. Pt currently with functional limitations due to the deficits listed below (see OT Problem List).  Pt will benefit from skilled OT to increase their safety and independence with ADL and functional mobility for ADL to facilitate discharge to venue listed below.      Follow Up Recommendations  SNF    Equipment Recommendations  None recommended by OT       Precautions / Restrictions Precautions Precautions: Fall Precaution Comments: NG tube Restrictions Weight Bearing Restrictions: No      Mobility Bed Mobility Pt in chair Transfers Overall transfer level: Needs assistance Equipment used: Rolling walker (2 wheeled) Transfers: Sit to/from Stand Sit to Stand: Mod assist;+2 physical assistance         General transfer comment: Assist to rise, stabilize, control descent. Multimodal cues for safety, technique, hand placement. increased time.          ADL Overall ADL's : Needs assistance/impaired     Grooming: Minimal assistance;Sitting                                 General ADL Comments: Pt sitting in chair. Pt with increased confusion this day per wife. Pts wife def agreeable to SNF as she is not ableto take care of him how he is currently. Pt followed commands for OT .                Pertinent Vitals/Pain Pain Assessment: No/denies pain        Extremity/Trunk Assessment Upper Extremity Assessment Upper Extremity Assessment: Generalized weakness           Communication Communication Communication: No difficulties   Cognition Arousal/Alertness:  Awake/alert Behavior During Therapy: WFL for tasks assessed/performed Overall Cognitive Status: History of cognitive impairments - at baseline                     General Comments   Pt with BUE tremors that are affecting his ability to perform ADL activity.  Pts wife states this is not the norm.  OT noted resting and intention tremors.   Discussed possibility of special fork.  Wife reports pt has a special fork that she would bring            Home Living Family/patient expects to be discharged to:: Skilled nursing facility Living Arrangements: Spouse/significant other   Type of Home: House Home Access: Stairs to enter Entrance Stairs-Number of Steps: 3   Home Layout: One [place]: Walker - 2 wheels          Prior Functioning/Environment Level of Independence: Independent with assistive device(s)        Comments: amb with RW, otherwise very independent per pt dtr    OT Diagnosis: Generalized weakness   OT Problem List: Decreased strength;Decreased activity tolerance;Impaired balance (sitting and/or standing)   OT Treatment/Interventions: Self-care/ADL training;DME and/or AE instruction;Patient/family education    OT Goals(Current goals can be found in the care plan section) Acute Rehab OT Goals Patient Stated Goal: go to  skilled for rehab before home OT Goal Formulation: With family Time For Goal Achievement: 12/13/14 Potential to Achieve Goals: Good ADL Goals Pt Will Perform Grooming: with supervision;standing Pt Will Perform Lower Body Dressing: with min assist;sit to/from stand Pt Will Transfer to Toilet: with min assist;bedside commode  OT Frequency: Min 2X/week   Barriers to D/C: Decreased caregiver support             End of Session Nurse Communication: Mobility status  Activity Tolerance: Patient tolerated treatment well Patient left: in chair;with call bell/phone within reach   Time: 1251-1305 OT Time  Calculation (min): 14 min Charges:  OT General Charges $OT Visit: 1 Procedure OT Evaluation $Initial OT Evaluation Tier I: 1 Procedure G-Codes:    Payton Mccallum D 12-13-2014, 1:50 PM

## 2014-12-05 NOTE — Progress Notes (Signed)
Patient ID: [name], male   DOB: 1920/09/26, 81 y.o.   MRN: [id number] 5 Days Post-Op  Subjective: Pt sitting up in a chair.  Says he's passing flatus.  Wife isn't sure.  No pain  Objective: Vital signs in last 24 hours: Temp:  [98.4 F (36.9 C)-98.7 F (37.1 C)] 98.4 F (36.9 C) (06/23 0501) Pulse Rate:  [60-75] 75 (06/23 0501) Resp:  [15-17] 15 (06/23 0501) BP: (129-173)/(50-86) 151/60 mmHg (06/23 0501) SpO2:  [91 %-96 %] 91 % (06/23 0501) Weight:  [84 kg (185 lb 3 oz)] 84 kg (185 lb 3 oz) (06/22 1910) Last BM Date: 11/28/14  Intake/Output from previous day: 06/22 0701 - 06/23 0700 In: 2450 [I.V.:2300; IV Piggyback:150] Out: 2475 [Urine:1825; Emesis/NG output:650] Intake/Output this shift:    PE: Abd: soft, some BS, NGT with some darker bilious output, open wound is clean Heart: regular Lungs: CTAB  Lab Results:   Recent Labs  12/03/14 0350 12/04/14 0350  WBC 6.7 6.7  HGB 10.4* 11.2*  HCT 31.8* 33.9*  PLT 132* 165   BMET  Recent Labs  12/03/14 0350 12/04/14 0350  NA 136 137  K 4.1 4.1  CL 106 106  CO2 23 25  GLUCOSE 123* 120*  BUN 14 11  CREATININE 1.51* 1.50*  CALCIUM 8.0* 8.3*   PT/INR No results for input(s): LABPROT, INR in the last 72 hours. CMP     Component Value Date/Time   NA 137 12/04/2014 0350   K 4.1 12/04/2014 0350   CL 106 12/04/2014 0350   CO2 25 12/04/2014 0350   GLUCOSE 120* 12/04/2014 0350   BUN 11 12/04/2014 0350   CREATININE 1.50* 12/04/2014 0350   CALCIUM 8.3* 12/04/2014 0350   PROT 6.7 11/30/2014 1150   ALBUMIN 3.7 11/30/2014 1150   AST 25 11/30/2014 1150   ALT 14* 11/30/2014 1150   ALKPHOS 52 11/30/2014 1150   BILITOT 1.2 11/30/2014 1150   GFRNONAA 39* 12/04/2014 0350   GFRAA 45* 12/04/2014 0350   Lipase     Component Value Date/Time   LIPASE 22 11/30/2014 1150       Studies/Results: No results found.  Anti-infectives: Anti-infectives    Start     Dose/Rate Route Frequency Ordered Stop   12/01/14 0100  piperacillin-tazobactam (ZOSYN) IVPB 3.375 g     3.375 g 12.5 mL/hr over 240 Minutes Intravenous Every 8 hours 11/30/14 1651     11/30/14 1645  piperacillin-tazobactam (ZOSYN) IVPB 3.375 g     3.375 g 100 mL/hr over 30 Minutes Intravenous  Once 11/30/14 1642 11/30/14 1722       Assessment/Plan   1. POD 5, S/p Diagnostic laparoscopy, Laparoscopic enterolysis of adhesions for 100 minutes, open open small bowel resection, 11/30/14, Dr. Alphonsa Overall -patient says he is passing some flatus, but he is having some BS as well.  Will try to clamp his NGT today and see how he does.  If he tolerates this well, will dc tomorrow -cont PT/OT.  Right now their recommendations are for [facility] with 24 h supervision, but may change as he still requires a lot of assistance.  I suspect given his age, his surgery, dementia, and his wife being elderly too, he will likely require at least short term SNF, but will see how he progresses with PT -Day 6 Zosyn -Heparin/SCDs 2. Renal insufficiency 3. H/o dementia -appreciate medicine's assistance with this patient   LOS: 5 days    Ovella Manygoats E 12/05/2014, 9:39 AM Pager: [phone]

## 2014-12-05 NOTE — Progress Notes (Signed)
Patient ID: CAMILLE THAU, male   DOB: 06-21-1920, 81 y.o.   MRN: [id number] [facility] PROGRESS NOTE  EMILIO BAYLOCK OBS:[id number] DOB: 08/08/1920 DOA: 11/30/2014 PCP: Simona Huh, MD  Brief narrative:    81 y.o. male with a past medical history of dementia, CVA, paroxysmal atrial fibrillation on full dose anticoagulation with pradaxa. He presented to [facility] ED with reports of abdominal pain and has had near syncope. He was seen in urgent care center and was found to have small bowel perforation. Pt is status post diagnostic laparoscopy with enterolysis of adhesions and small bowel resection done 11/30/2014 by Dr. Lucia Gaskins.   Assessment/Plan:    Principal Problem: Small bowel perforation / postoperative ileus  - SBO secondary to adhesions. - Patient is status post diagnostic laparoscopy with enterolysis of adhesions and small bowel resection done 11/30/2014 by Dr. Lucia Gaskins - Continue zosyn for now - Clamp NG tube per surgery  - Continue IV fluids for hydration until PO intake advanced   Active Problems: Acute renal failure - Likely prerenal from GI losses, NG tube - Baseline Cr WNL and acutely elevated at 1.68 on this admission - Continue IV fluids - Creatinine improving with IV fluids   Normocytic anemia - Hemoglobin stable at 11.2  History of atrial fibrillation - CHADS2 vasc score of 4 - Will resume aggrenox once patient able to tolerate PO intake  - Not a candidate for [facility] due to age and comorbidities  - Rate controlled with metoprolol   Dementia / Depression - Continue Haldol PRN - Stable   Thrombocytopenia - Mild and resolved by now  DVT Prophylaxis  - Heparin subQ    Code Status: Partial code  Family Communication:  plan of care discussed with the patient's wife at the bedside  Disposition Plan: per surgery   IV access:  Peripheral IV  Procedures and diagnostic studies:    Ct Abdomen Pelvis W Contrast 11/30/2014   There is free intraperitoneal gas and  fatty inflammatory changes adjacent to small bowel loops compatible with a small bowel inflammatory process and rupture. There is no adjacent abscess. Critical Value/emergent results were called by telephone at the time of interpretation on 11/30/2014 at 2:17 pm to Dr. Verneda Skill, who verbally acknowledged these results.  Enlarging hyperdense lesion in the left kidney worrisome for neoplasm. MRI with contrast is recommended when feasible.    Medical Consultants:  Surgery   Other Consultants:  Physical therapy   IAnti-Infectives:   Zosyn 11/30/2014 -->   Leisa Lenz, MD  [facility] Pager [phone]  Time spent in minutes: 15 minutes  If 7PM-7AM, please contact night-coverage [email or web address] Password [facility] 12/05/2014, 1:44 PM   LOS: 5 days    HPI/Subjective: No acute overnight events. Patient reports no vomiting.   Objective: Filed Vitals:   12/04/14 1800 12/04/14 1910 12/04/14 2051 12/05/14 0501  BP: 132/50 157/64 129/59 151/60  Pulse: 66 70 69 75  Temp:  98.7 F (37.1 C) 98.5 F (36.9 C) 98.4 F (36.9 C)  TempSrc:  Oral Oral Oral  Resp: 17 16 16 15   Height:  5\' 10"  (1.778 m)    Weight:  84 kg (185 lb 3 oz)    SpO2: 95% 95% 93% 91%    Intake/Output Summary (Last 24 hours) at 12/05/14 1344 Last data filed at 12/05/14 0600  Gross per 24 hour  Intake   1800 ml  Output   1725 ml  Net     75 ml    Exam:  General:  Pt is alert, follows commands appropriately, not in acute distress  Cardiovascular: Rate controlled, S1/S2 appreciated   Respiratory: No wheezing, no crackles, no rhonchi  Abdomen: Non tender, non distended; (+) NG tube  Extremities: No leg edema, pulses palpable bilaterally  Neuro: Nonfocal  Data Reviewed: Basic Metabolic Panel:  Recent Labs Lab 11/30/14 1150 12/01/14 0445 12/02/14 0350 12/03/14 0350 12/04/14 0350  NA 139 137 135 136 137  K 4.7 4.4 4.1 4.1 4.1  CL 109 107 107 106 106  CO2 25 23 24 23 25   GLUCOSE 108* 151* 130*  123* 120*  BUN 22* 20 20 14 11   CREATININE 1.69* 1.59* 1.71* 1.51* 1.50*  CALCIUM 8.5* 8.1* 7.9* 8.0* 8.3*   Liver Function Tests:  Recent Labs Lab 11/30/14 1150  AST 25  ALT 14*  ALKPHOS 52  BILITOT 1.2  PROT 6.7  ALBUMIN 3.7    Recent Labs Lab 11/30/14 1150  LIPASE 22   No results for input(s): AMMONIA in the last 168 hours. CBC:  Recent Labs Lab 11/30/14 1150 12/01/14 0445 12/02/14 0350 12/03/14 0350 12/04/14 0350  WBC 10.0 7.8 7.4 6.7 6.7  NEUTROABS 8.1*  --  5.3  --   --   HGB 12.3* 12.5* 10.7* 10.4* 11.2*  HCT 38.0* 38.4* 32.1* 31.8* 33.9*  MCV 99.0 98.7 99.1 95.8 96.9  PLT 147* 150 122* 132* 165   Cardiac Enzymes: No results for input(s): CKTOTAL, CKMB, CKMBINDEX, TROPONINI in the last 168 hours. BNP: Invalid input(s): POCBNP CBG: No results for input(s): GLUCAP in the last 168 hours.  Recent Results (from the past 240 hour(s))  MRSA PCR Screening     Status: None   Collection Time: 11/30/14 11:07 PM  Result Value Ref Range Status   MRSA by PCR NEGATIVE NEGATIVE Final     Scheduled Meds: . antiseptic oral rinse  7 mL Mouth Rinse q12n4p  . chlorhexidine  15 mL Mouth Rinse BID  . heparin  5,000 Units Subcutaneous 3 times per day  . metoprolol  5 mg Intravenous 3 times per day  . piperacillin-tazobactam (ZOSYN)  IV  3.375 g Intravenous Q8H   Continuous Infusions: . dextrose 5 % and 0.45 % NaCl with KCl 20 mEq/L 100 mL/hr at 12/05/14 0342

## 2014-12-05 NOTE — Progress Notes (Signed)
Physical Therapy Treatment Patient Details Name: [name] MRN: [id number] DOB: 05-19-1921 Today's Date: 12/05/2014    History of Present Illness spt adm 11/30/14 secondary to a perforated jejunal diverticula. Underwent Laparoscopic lysis of adhesion and small bowel resection on 11/30/14. PMHx: abd surgeries, dementia, CVA, afib    PT Comments    Progressing with mobility. Continues to require +2 for mobility. Discussed d/c plan with wife-agreeable to SNF.  Spoke with SW this am about consult.   Follow Up Recommendations  SNF     Equipment Recommendations  None recommended by PT    Recommendations for Other Services       Precautions / Restrictions Precautions Precautions: Fall Precaution Comments: NG tube Restrictions Weight Bearing Restrictions: No    Mobility  Bed Mobility Overal bed mobility: Needs Assistance Bed Mobility: Supine to Sit;Sit to Supine       Sit to supine: Mod assist;+2 for physical assistance;+2 for safety/equipment   General bed mobility comments: assist for trunk and bil LEs.   Transfers Overall transfer level: Needs assistance Equipment used: Rolling walker (2 wheeled) Transfers: Sit to/from Stand Sit to Stand: Mod assist;+2 physical assistance;+2 safety/equipment         General transfer comment: Assist to rise, stabilize, control descent. Multimodal cues for safety, technique, hand placement. increased time.   Ambulation/Gait Ambulation/Gait assistance: Mod assist;+2 physical assistance;+2 safety/equipment Ambulation Distance (Feet): 75 Feet Assistive device: Rolling walker (2 wheeled) Gait Pattern/deviations: Step-through pattern;Decreased stride length;Trunk flexed     General Gait Details: Assist to stabilize pt and maneuver with RW. Multimodal cues for distance from RW, posture.    Stairs            Wheelchair Mobility    Modified Rankin (Stroke Patients Only)       Balance                                     Cognition Arousal/Alertness: Awake/alert Behavior During Therapy: WFL for tasks assessed/performed Overall Cognitive Status: History of cognitive impairments - at baseline                      Exercises      General Comments        Pertinent Vitals/Pain Pain Assessment: No/denies pain    Home Living Family/patient expects to be discharged to:: Skilled nursing facility Living Arrangements: Spouse/significant other   Type of Home: House Home Access: Stairs to enter   Home Layout: One level Home Equipment: Environmental consultant - 2 wheels      Prior Function Level of Independence: Independent with assistive device(s)      Comments: amb with RW, otherwise very independent per pt dtr   PT Goals (current goals can now be found in the care plan section) Acute Rehab PT Goals Patient Stated Goal: go to skilled for rehab before home Progress towards PT goals: Progressing toward goals    Frequency  Min 3X/week    PT Plan Current plan remains appropriate    Co-evaluation             End of Session   Activity Tolerance: Patient tolerated treatment well Patient left: in bed;with call bell/phone within reach;with bed alarm set     Time: 9562-1308 PT Time Calculation (min) (ACUTE ONLY): 24 min  Charges:  $Gait Training: 8-22 mins $Therapeutic Activity: 8-22 mins  G Codes:      Weston Anna, MPT Pager: [phone]

## 2014-12-05 NOTE — Progress Notes (Signed)
Nutrition Follow-up  DOCUMENTATION CODES:  Not applicable  INTERVENTION: - Will continue to monitor for ability to advance diet  - If unable to advance diet prior to d/c and, if within [place], may need to consider nutrition support - RD will continue to monitor for needs  NUTRITION DIAGNOSIS:  Inadequate oral intake related to inability to eat as evidenced by NPO status. -ongoing  GOAL:  Patient will meet greater than or equal to 90% of their needs -unmet  MONITOR:  Diet advancement, Weight trends, Labs, I & O's  ASSESSMENT: 81 year old male with past medical history significant for prior stroke, essential tremor, paroxysmal A. fib, arthritis, dementia, presenting to the ED for abdominal pain. Pain was sudden onset this morning around 0700. Pain localized throughout his entire lower abdomen, described as sharp and stabbing. There is no radiation of pain. No nausea, vomiting, or diarrhea. Pain is significantly worse with movement. Appears to have perforation of small bowel to the left of midline near the umbilicus. Underwent emergent laparotomy 11/30/14.  Pt remains NPO since admission. POD #5 Diagnostic laparoscopy, Laparoscopic enterolysis of adhesions for 100 minutes, open open small bowel resection.  Surgery states plan to clamp NGT and assess tolerance today and, if pt tolerates well, plan to d/c tomorrow. Recommend advancement of diet if NGT able to be clamped. Will monitor for POC.  Not meeting needs. Medications reviewed. Labs reviewed; creatinine elevated, Ca: 8.3 mg/dL, GFR: 39.  Height:  Ht Readings from Last 1 Encounters:  12/04/14 5\' 10"  (1.778 m)    Weight:  Wt Readings from Last 1 Encounters:  12/04/14 185 lb 3 oz (84 kg)    Ideal Body Weight:  75.45 kg (kg)  Wt Readings from Last 10 Encounters:  12/04/14 185 lb 3 oz (84 kg)  08/29/14 186 lb 12.8 oz (84.732 kg)  02/28/14 194 lb 6.4 oz (88.179 kg)  07/10/13 192 lb 8 oz (87.317 kg)  01/09/13 196 lb (88.905  kg)    BMI:  Body mass index is 26.57 kg/(m^2).  Estimated Nutritional Needs:  Kcal:  1700-1900  Protein:  70-80 grams  Fluid:  2-2.2 L/day  Skin:  Wound (see comment) (abdominal surgical wound)  Diet Order:  Diet NPO time specified  EDUCATION NEEDS:  No education needs identified at this time   Intake/Output Summary (Last 24 hours) at 12/05/14 1211 Last data filed at 12/05/14 0600  Gross per 24 hour  Intake   1900 ml  Output   1725 ml  Net    175 ml    Last BM:  PTA    Jarome Matin, RD, LDN Inpatient Clinical Dietitian Pager # [phone] After hours/weekend pager # [phone]

## 2014-12-05 NOTE — Clinical Social Work Note (Signed)
Clinical Social Work Assessment  Patient Details  Name: [name] MRN: [id number] Date of Birth: 04/27/1921  Date of referral:  12/05/14               Reason for consult:  Discharge Planning                Permission sought to share information with:  Family Supports Permission granted to share information::  Yes, Verbal Permission Granted  Name::     [name]  Agency::     Relationship::  spouse  Contact Information:  [phone]  Housing/Transportation Living arrangements for the past 2 months:  Single Family Home Source of Information:  Spouse Patient Interpreter Needed:  None Criminal Activity/Legal Involvement Pertinent to Current Situation/Hospitalization:  No - Comment as needed Significant Relationships:  Spouse, Adult Children Lives with:  Spouse Do you feel safe going back to the place where you live?  No Need for family participation in patient care:  Yes (Comment)  Care giving concerns:  Pt admitted from home where pt lives with spouse. Pt needs currently exceeding what pt spouse can manage at home. SNF for short term rehab recommended.   Social Worker assessment / plan:  CSW received referral for New SNF.   CSW visite pt room and met with pt and pt wife at bedside. Pt alert and oriented to person only; resting on and off throughout assessment. CSW introduced self and explained role. Pt wife reports that pt and pt wife were managing living independently prior to admission. Pt wife discussed that pt was able to ambulate with a walker, dress himself, and do light household tasks. Pt wife reports that pt wife had to assist pt with bathing as pt and pt wife do not have a walk-in shower at their home. Pt wife stated that pt was playing cards with friends and went dancing every Monday prior to admission. CSW provided support as pt wife discussed that pt is weaker and pt wife recognizes that pt will need rehab at [facility] before returning home. CSW provided education  surrounding SNF and discussed process of SNF search. Pt wife reports that their children will be an important aspect in the decision about the facility. CSW expressed understanding.   CSW completed FL2. Pt currently has NG tube clamped and anticipated that NG tube can be removed. CSW to initiate SNF search once NG tube removed which will hopefully be tomorrow AM according to MD note.   CSW to follow up with pt wife regarding SNF bed offers.   CSW to continue to follow to provide support and assist with pt disposition planning.    Employment status:  Retired Forensic scientist:  Medicare PT Recommendations:  [place] / Referral to community resources:  [place]  Patient/Family's Response to care:  Pt alert and oriented to person only. Pt wife supportive and actively involved in pt care. Pt wife feels short term rehab at SNF will be best option following hospitalization in order for pt to continue working toward regaining his strength.   Patient/Family's Understanding of and Emotional Response to Diagnosis, Current Treatment, and Prognosis:  Pt wife displayed good understanding of pt diagnosis and treatment plan. Pt wife discussed that MD stated today that pt had bowel sounds which was a positive sign. Pt wife coping appropriately with plans for rehab at SNF and hopeful that pt will continue to progress well while in the hospital.  Emotional Assessment Appearance:  Appears stated age Attitude/Demeanor/Rapport:  Unable to Assess (pt oriented to person only) Affect (typically observed):  Quiet, Pleasant (pt oriented to person only) Orientation:  Oriented to Self Alcohol / Substance use:  Not Applicable Psych involvement (Current and /or in the community):  No (Comment)  Discharge Needs  Concerns to be addressed:  Discharge Planning Concerns Readmission within the last 30 days:  No Current discharge risk:  Physical Impairment Barriers to  Discharge:  Continued Medical Work up   Ladell Pier, [place] 12/05/2014, 2:46 PM  [phone]

## 2014-12-05 NOTE — Clinical Social Work Placement (Signed)
   CLINICAL SOCIAL WORK PLACEMENT  NOTE  Date:  12/05/2014  Patient Details  Name: [name] MRN: [id number] Date of Birth: 08-21-1920  Clinical Social Work is seeking post-discharge placement for this patient at the [place] level of care (*CSW will initial, date and re-position this form in  chart as items are completed):  Yes   Patient/family provided with [place] Work Department's list of facilities offering this level of care within the geographic area requested by the patient (or if unable, by the patient's family).  Yes   Patient/family informed of their freedom to choose among providers that offer the needed level of care, that participate in Medicare, Medicaid or managed care program needed by the patient, have an available bed and are willing to accept the patient.  Yes   Patient/family informed of [facility]'s ownership interest in [facility] and [facility], as well as of the fact that they are under no obligation to receive care at these facilities.  PASRR submitted to EDS on       PASRR number received on       Existing PASRR number confirmed on       FL2 transmitted to all facilities in geographic area requested by pt/family on       FL2 transmitted to all facilities within larger geographic area on       Patient informed that his/her managed care company has contracts with or will negotiate with certain facilities, including the following:            Patient/family informed of bed offers received.  Patient chooses bed at       Physician recommends and patient chooses bed at      Patient to be transferred to   on  .  Patient to be transferred to facility by       Patient family notified on   of transfer.  Name of family member notified:        PHYSICIAN Please sign FL2     Additional Comment:    _______________________________________________ Ladell Pier, LCSW 12/05/2014, 3:06 PM

## 2014-12-06 DIAGNOSIS — I482 Chronic atrial fibrillation: Secondary | ICD-10-CM

## 2014-12-06 LAB — CBC
HEMATOCRIT: 31.7 % — AB (ref 39.0–52.0)
Hemoglobin: 10.6 g/dL — ABNORMAL LOW (ref 13.0–17.0)
MCH: 32.1 pg (ref 26.0–34.0)
MCHC: 33.4 g/dL (ref 30.0–36.0)
MCV: 96.1 fL (ref 78.0–100.0)
Platelets: 195 10*3/uL (ref 150–400)
RBC: 3.3 MIL/uL — ABNORMAL LOW (ref 4.22–5.81)
RDW: 12.5 % (ref 11.5–15.5)
WBC: 5.5 10*3/uL (ref 4.0–10.5)

## 2014-12-06 LAB — BASIC METABOLIC PANEL
ANION GAP: 7 (ref 5–15)
BUN: 10 mg/dL (ref 6–20)
CALCIUM: 8.5 mg/dL — AB (ref 8.9–10.3)
CO2: 24 mmol/L (ref 22–32)
CREATININE: 1.54 mg/dL — AB (ref 0.61–1.24)
Chloride: 107 mmol/L (ref 101–111)
GFR calc non Af Amer: 38 mL/min — ABNORMAL LOW (ref >60)
GFR, EST AFRICAN AMERICAN: 44 mL/min — AB (ref >60)
GLUCOSE: 121 mg/dL — AB (ref 65–99)
Potassium: 4 mmol/L (ref 3.5–5.1)
Sodium: 138 mmol/L (ref 135–145)

## 2014-12-06 NOTE — Progress Notes (Signed)
Patient ID: [name], male   DOB: 03-29-1921, 81 y.o.   MRN: [id number] [facility] PROGRESS NOTE  [name]:[id number] DOB: 17-Oct-1920 DOA: 11/30/2014 PCP: Randall Huh, MD  Brief narrative:    81 y.o. male with a past medical history of dementia, CVA, paroxysmal atrial fibrillation on full dose anticoagulation with pradaxa. He presented to [facility] ED with reports of abdominal pain and has had near syncope. He was seen in urgent care center and was found to have small bowel perforation. Pt is status post diagnostic laparoscopy with enterolysis of adhesions and small bowel resection done 11/30/2014 by Dr. Lucia Gaskins. Postoperatively he has developed ileus and required NG tube which was clamped 6/23 and since then no nausea and not much output from NGT.    Assessment/Plan:    Principal Problem: Small bowel perforation / postoperative ileus  - SBO secondary to adhesions. - Patient is status post diagnostic laparoscopy with enterolysis of adhesions and small bowel resection done 11/30/2014 by Dr. Lucia Gaskins - Continue zosyn for next 24 hours or per surgery recomendations - NG tube clamped 6/23. No nausea and not much output in NGT - Diet to be advanced per surgery  Active Problems: Acute renal failure - Likely prerenal from GI losses, NG tube - Baseline Cr WNL and acutely elevated at 1.68 on this admission - Trended down with IV fluids to 1.5 range  Normocytic anemia - Hemoglobin stable at 11.2, 10.6 - No current indications for transfusion   History of atrial fibrillation - CHADS2 vasc score of 4 - Resume aggrenox once patient able to tolerate PO intake  - Not a candidate for [facility] due to age and comorbidities  - Rate controlled with metoprolol   Dementia / Depression - Resume namenda once tolerates PO  Thrombocytopenia - Mild and resolved by now  DVT Prophylaxis  - Heparin subQ in hospital    Code Status: Partial code  Family Communication:  plan of care discussed  with the patient's wife at the bedside  Disposition Plan: per surgery   IV access:  Peripheral IV  Procedures and diagnostic studies:    Ct Abdomen Pelvis W Contrast 11/30/2014   There is free intraperitoneal gas and fatty inflammatory changes adjacent to small bowel loops compatible with a small bowel inflammatory process and rupture. There is no adjacent abscess. Critical Value/emergent results were called by telephone at the time of interpretation on 11/30/2014 at 2:17 pm to Dr. Verneda Skill, who verbally acknowledged these results.  Enlarging hyperdense lesion in the left kidney worrisome for neoplasm. MRI with contrast is recommended when feasible.    Medical Consultants:  Surgery   Other Consultants:  Physical therapy   IAnti-Infectives:   Zosyn 11/30/2014 -->   Leisa Lenz, MD  [facility] Pager [phone]  Time spent in minutes: 15 minutes  If 7PM-7AM, please contact night-coverage [email or web address] Password [facility] 12/06/2014, 10:51 AM   LOS: 6 days    HPI/Subjective: No acute overnight events. Patient reports no nausea or vomiting.   Objective: Filed Vitals:   12/05/14 0501 12/05/14 1443 12/05/14 2224 12/06/14 0509  BP: 151/60 150/58 164/57 152/49  Pulse: 75 64 68 59  Temp: 98.4 F (36.9 C) 98.8 F (37.1 C) 98 F (36.7 C) 98.2 F (36.8 C)  TempSrc: Oral Oral Oral   Resp: 15 18 20 20   Height:      Weight:    82.1 kg (181 lb)  SpO2: 91% 94% 96% 96%    Intake/Output Summary (Last 24  hours) at 12/06/14 1051 Last data filed at 12/05/14 1712  Gross per 24 hour  Intake      0 ml  Output    900 ml  Net   -900 ml    Exam:   General:  Pt is alert, not in acute distress  Cardiovascular: irregular rhythm, rate controlled, S1/S2 (+)  Respiratory: bilateral air entry, no wheezing   Abdomen: (+) NG tube, (+) BS, non distended abdomen   Extremities: No leg swelling, pulses palpable  Neuro: No focal deficits   Data Reviewed: Basic Metabolic  Panel:  Recent Labs Lab 12/01/14 0445 12/02/14 0350 12/03/14 0350 12/04/14 0350 12/06/14  NA 137 135 136 137 138  K 4.4 4.1 4.1 4.1 4.0  CL 107 107 106 106 107  CO2 23 24 23 25 24   GLUCOSE 151* 130* 123* 120* 121*  BUN 20 20 14 11 10   CREATININE 1.59* 1.71* 1.51* 1.50* 1.54*  CALCIUM 8.1* 7.9* 8.0* 8.3* 8.5*   Liver Function Tests:  Recent Labs Lab 11/30/14 1150  AST 25  ALT 14*  ALKPHOS 52  BILITOT 1.2  PROT 6.7  ALBUMIN 3.7    Recent Labs Lab 11/30/14 1150  LIPASE 22   No results for input(s): AMMONIA in the last 168 hours. CBC:  Recent Labs Lab 11/30/14 1150 12/01/14 0445 12/02/14 0350 12/03/14 0350 12/04/14 0350 12/06/14  WBC 10.0 7.8 7.4 6.7 6.7 5.5  NEUTROABS 8.1*  --  5.3  --   --   --   HGB 12.3* 12.5* 10.7* 10.4* 11.2* 10.6*  HCT 38.0* 38.4* 32.1* 31.8* 33.9* 31.7*  MCV 99.0 98.7 99.1 95.8 96.9 96.1  PLT 147* 150 122* 132* 165 195   Cardiac Enzymes: No results for input(s): CKTOTAL, CKMB, CKMBINDEX, TROPONINI in the last 168 hours. BNP: Invalid input(s): POCBNP CBG: No results for input(s): GLUCAP in the last 168 hours.  Recent Results (from the past 240 hour(s))  MRSA PCR Screening     Status: None   Collection Time: 11/30/14 11:07 PM  Result Value Ref Range Status   MRSA by PCR NEGATIVE NEGATIVE Final     Scheduled Meds: . antiseptic oral rinse  7 mL Mouth Rinse q12n4p  . chlorhexidine  15 mL Mouth Rinse BID  . heparin  5,000 Units Subcutaneous 3 times per day  . metoprolol  5 mg Intravenous 3 times per day  . piperacillin-tazobactam (ZOSYN)  IV  3.375 g Intravenous Q8H   Continuous Infusions: . dextrose 5 % and 0.45 % NaCl with KCl 20 mEq/L 100 mL/hr (12/06/14 0959)

## 2014-12-06 NOTE — Progress Notes (Signed)
CSW continuing to follow for disposition planning.  CSW followed up with pt and pt wife at bedside to discuss SNF bed offers. Pt sitting in chair, NG tube out, pleasantly confused and oriented to person only.  CSW provided list of SNF bed offers to pt wife and clarified pt wife questions and concerns. Pt wife plans to review SNF bed offers with their children in order to make a decision about SNF. Pt wife shared that she is hopeful that pt can get a private room as she feels that it will be an easier transition for pt to go to a private room at rehab given pt dementia.   CSW spoke to [facility] PA who stated that anticipation is for pt to be ready for discharge on Monday at the earliest.   CSW to continue to follow to provide support and assist with pt disposition planning.   Alison Murray, MSW, [place] Work [phone]

## 2014-12-06 NOTE — Progress Notes (Signed)
ANTIBIOTIC CONSULT NOTE - FOLLOW UP  Pharmacy Consult for Zosyn Indication: intra-abdominal infection  Allergies  Allergen Reactions  . Topamax [Topiramate]     Patient Measurements: Height: 5\' 10"  (177.8 cm) Weight: 181 lb (82.1 kg) IBW/kg (Calculated) : 73  Vital Signs: Temp: 98.2 F (36.8 C) (06/24 0509) Temp Source: Oral (06/23 2224) BP: 152/49 mmHg (06/24 0509) Pulse Rate: 59 (06/24 0509) Intake/Output from previous day: 06/23 0701 - 06/24 0700 In: -  Out: 900 [Urine:900] Intake/Output from this shift:    Labs:  Recent Labs  12/04/14 0350 12/06/14  WBC 6.7 5.5  HGB 11.2* 10.6*  PLT 165 195  CREATININE 1.50* 1.54*   Estimated Creatinine Clearance: 32.3 mL/min (by C-G formula based on Cr of 1.54). No results for input(s): VANCOTROUGH, VANCOPEAK, VANCORANDOM, GENTTROUGH, GENTPEAK, GENTRANDOM, TOBRATROUGH, TOBRAPEAK, TOBRARND, AMIKACINPEAK, AMIKACINTROU, AMIKACIN in the last 72 hours.   Microbiology: Recent Results (from the past 720 hour(s))  MRSA PCR Screening     Status: None   Collection Time: 11/30/14 11:07 PM  Result Value Ref Range Status   MRSA by PCR NEGATIVE NEGATIVE Final    Comment:        The GeneXpert MRSA Assay (FDA approved for NASAL specimens only), is one component of a comprehensive MRSA colonization surveillance program. It is not intended to diagnose MRSA infection nor to guide or monitor treatment for MRSA infections.     Assessment: 32 yoM with PMH significant for prior stroke, essential tremor, PAF, arthritis, dementia, presenting to the ED for abdominal pain, found to have perforated jejunal diverticula. Underwent Laparoscopic lysis of adhesion and small bowel resection on 11/30/14.  Pharmacy dosing Zosyn for intra-abdominal infection.  6/18 >> Zosyn  >>  Temp: afebrile WBC: WNL Renal: SCr 1.54, CrCl ~ 32 ml/min CG  6/18 MRSA PCR: negative  Goal of Therapy:  Eradication of infection Appropriate antibiotic dosing for  indication and renal function  Plan:  Day 7 antibiotics Continue Zosyn 3.375 g IV given every 8 hrs by 4-hr infusion Follow clinical course, renal function, culture results as available Follow for de-escalation of antibiotics and LOT   Lindell Spar, PharmD, BCPS Pager: [phone] 12/06/2014 7:45 AM

## 2014-12-06 NOTE — Progress Notes (Signed)
Patient ID: [name], male   DOB: July 10, 1920, 81 y.o.   MRN: [id number] 6 Days Post-Op  Subjective: Pt lying comfortably in bed.  No nausea with NGT clamped.   Objective: Vital signs in last 24 hours: Temp:  [98 F (36.7 C)-98.8 F (37.1 C)] 98.2 F (36.8 C) (06/24 0509) Pulse Rate:  [59-68] 59 (06/24 0509) Resp:  [18-20] 20 (06/24 0509) BP: (150-164)/(49-58) 152/49 mmHg (06/24 0509) SpO2:  [94 %-96 %] 96 % (06/24 0509) Weight:  [82.1 kg (181 lb)] 82.1 kg (181 lb) (06/24 0509) Last BM Date: 12/05/14  Intake/Output from previous day: 06/23 0701 - 06/24 0700 In: -  Out: 900 [Urine:900] Intake/Output this shift:    PE: Abd: soft, appropriately tender, +BS, ND, midline wound is clean and packed and other incisions are c/d/i with staples  Lab Results:   Recent Labs  12/04/14 0350 12/06/14  WBC 6.7 5.5  HGB 11.2* 10.6*  HCT 33.9* 31.7*  PLT 165 195   BMET  Recent Labs  12/04/14 0350 12/06/14  NA 137 138  K 4.1 4.0  CL 106 107  CO2 25 24  GLUCOSE 120* 121*  BUN 11 10  CREATININE 1.50* 1.54*  CALCIUM 8.3* 8.5*   PT/INR No results for input(s): LABPROT, INR in the last 72 hours. CMP     Component Value Date/Time   NA 138 12/06/2014 0000   K 4.0 12/06/2014 0000   CL 107 12/06/2014 0000   CO2 24 12/06/2014 0000   GLUCOSE 121* 12/06/2014 0000   BUN 10 12/06/2014 0000   CREATININE 1.54* 12/06/2014 0000   CALCIUM 8.5* 12/06/2014 0000   PROT 6.7 11/30/2014 1150   ALBUMIN 3.7 11/30/2014 1150   AST 25 11/30/2014 1150   ALT 14* 11/30/2014 1150   ALKPHOS 52 11/30/2014 1150   BILITOT 1.2 11/30/2014 1150   GFRNONAA 38* 12/06/2014 0000   GFRAA 44* 12/06/2014 0000   Lipase     Component Value Date/Time   LIPASE 22 11/30/2014 1150       Studies/Results: No results found.  Anti-infectives: Anti-infectives    Start     Dose/Rate Route Frequency Ordered Stop   12/01/14 0100  piperacillin-tazobactam (ZOSYN) IVPB 3.375 g     3.375 g 12.5 mL/hr over  240 Minutes Intravenous Every 8 hours 11/30/14 1651     11/30/14 1645  piperacillin-tazobactam (ZOSYN) IVPB 3.375 g     3.375 g 100 mL/hr over 30 Minutes Intravenous  Once 11/30/14 1642 11/30/14 1722       Assessment/Plan   1. POD 6, S/p Diagnostic laparoscopy, Laparoscopic enterolysis of adhesions for 100 minutes, open open small bowel resection, 11/30/14, Dr. Alphonsa Overall -tolerated NGT clamping.  DC NGT and give clear liquids -PT recommends SNF.  SW working on this -Day 6/7 Zosyn, suspect can stop after tomorrow -Heparin/SCDs 2. Renal insufficiency, stable 3. H/o dementia -appreciate medicine's assistance with this patient  LOS: 6 days    Vernita Tague E 12/06/2014, 10:22 AM Pager: [phone]

## 2014-12-06 NOTE — Clinical Social Work Placement (Signed)
   CLINICAL SOCIAL WORK PLACEMENT  NOTE  Date:  12/06/2014  Patient Details  Name: [name] MRN: [id number] Date of Birth: 18-Jun-1920  Clinical Social Work is seeking post-discharge placement for this patient at the [place] level of care (*CSW will initial, date and re-position this form in  chart as items are completed):  Yes   Patient/family provided with [place] Work Department's list of facilities offering this level of care within the geographic area requested by the patient (or if unable, by the patient's family).  Yes   Patient/family informed of their freedom to choose among providers that offer the needed level of care, that participate in Medicare, Medicaid or managed care program needed by the patient, have an available bed and are willing to accept the patient.  Yes   Patient/family informed of 's ownership interest in [facility] and [facility], as well as of the fact that they are under no obligation to receive care at these facilities.  PASRR submitted to EDS on 12/06/14     PASRR number received on 12/06/14     Existing PASRR number confirmed on       FL2 transmitted to all facilities in geographic area requested by pt/family on 12/06/14     FL2 transmitted to all facilities within larger geographic area on       Patient informed that his/her managed care company has contracts with or will negotiate with certain facilities, including the following:            Patient/family informed of bed offers received.  Patient chooses bed at       Physician recommends and patient chooses bed at      Patient to be transferred to   on  .  Patient to be transferred to facility by       Patient family notified on   of transfer.  Name of family member notified:        PHYSICIAN Please sign FL2     Additional Comment:    _______________________________________________ Ladell Pier, LCSW 12/06/2014,  11:29 AM

## 2014-12-06 NOTE — Clinical Social Work Placement (Signed)
   CLINICAL SOCIAL WORK PLACEMENT  NOTE  Date:  12/06/2014  Patient Details  Name: [name] MRN: [id number] Date of Birth: 11-17-1920  Clinical Social Work is seeking post-discharge placement for this patient at the [place] level of care (*CSW will initial, date and re-position this form in  chart as items are completed):  Yes   Patient/family provided with [place] Work Department's list of facilities offering this level of care within the geographic area requested by the patient (or if unable, by the patient's family).  Yes   Patient/family informed of their freedom to choose among providers that offer the needed level of care, that participate in Medicare, Medicaid or managed care program needed by the patient, have an available bed and are willing to accept the patient.  Yes   Patient/family informed of [facility]'s ownership interest in [facility] and [facility], as well as of the fact that they are under no obligation to receive care at these facilities.  PASRR submitted to EDS on 12/06/14     PASRR number received on 12/06/14     Existing PASRR number confirmed on       FL2 transmitted to all facilities in geographic area requested by pt/family on 12/06/14     FL2 transmitted to all facilities within larger geographic area on       Patient informed that his/her managed care company has contracts with or will negotiate with certain facilities, including the following:        Yes   Patient/family informed of bed offers received.  Patient chooses bed at       Physician recommends and patient chooses bed at      Patient to be transferred to   on  .  Patient to be transferred to facility by       Patient family notified on   of transfer.  Name of family member notified:        PHYSICIAN Please sign FL2     Additional Comment:    _______________________________________________ Ladell Pier, LCSW 12/06/2014,  1:24 PM

## 2014-12-07 LAB — CLOSTRIDIUM DIFFICILE BY PCR: Toxigenic C. Difficile by PCR: NEGATIVE

## 2014-12-07 LAB — BASIC METABOLIC PANEL
Anion gap: 6 (ref 5–15)
BUN: 9 mg/dL (ref 6–20)
CO2: 23 mmol/L (ref 22–32)
Calcium: 8.3 mg/dL — ABNORMAL LOW (ref 8.9–10.3)
Chloride: 108 mmol/L (ref 101–111)
Creatinine, Ser: 1.48 mg/dL — ABNORMAL HIGH (ref 0.61–1.24)
GFR calc non Af Amer: 40 mL/min — ABNORMAL LOW (ref >60)
GFR, EST AFRICAN AMERICAN: 46 mL/min — AB (ref >60)
Glucose, Bld: 123 mg/dL — ABNORMAL HIGH (ref 65–99)
Potassium: 3.7 mmol/L (ref 3.5–5.1)
SODIUM: 137 mmol/L (ref 135–145)

## 2014-12-07 MED ORDER — HYDROCORTISONE 1 % EX CREA
TOPICAL_CREAM | Freq: Two times a day (BID) | CUTANEOUS | Status: DC | PRN
  Administered 2014-12-07: via TOPICAL
  Filled 2014-12-07: qty 28

## 2014-12-07 MED ORDER — LOPERAMIDE HCL 1 MG/5ML PO LIQD
1.0000 mg | Freq: Once | ORAL | Status: AC
  Administered 2014-12-07: 1 mg via ORAL
  Filled 2014-12-07: qty 5

## 2014-12-07 MED ORDER — DIPHENHYDRAMINE HCL 25 MG PO CAPS
25.0000 mg | ORAL_CAPSULE | Freq: Three times a day (TID) | ORAL | Status: DC | PRN
  Administered 2014-12-07: 25 mg via ORAL
  Filled 2014-12-07: qty 1

## 2014-12-07 NOTE — Progress Notes (Signed)
Patient ID: [name], male   DOB: November 15, 1920, 81 y.o.   MRN: [id number] [facility] PROGRESS NOTE  DIAMONTE STAVELY AOZ:[id number] DOB: 01/29/1921 DOA: 11/30/2014 PCP: Simona Huh, MD  Brief narrative:    81 y.o. male with a past medical history of dementia, CVA, paroxysmal atrial fibrillation on full dose anticoagulation with pradaxa. He presented to [facility] ED with reports of abdominal pain and has had near syncope. He was seen in urgent care center and was found to have small bowel perforation. Pt is status post diagnostic laparoscopy with enterolysis of adhesions and small bowel resection done 11/30/2014 by Dr. Lucia Gaskins. Postoperatively he has developed ileus and required NG tube which was clamped 6/23 and then discontinued 6/24.  Assessment/Plan:    Principal Problem: Small bowel perforation / postoperative ileus  - SBO secondary to adhesions. - Status post diagnostic laparoscopy with enterolysis of adhesions and small bowel resection done 11/30/2014 by Dr. Lucia Gaskins - has received zosyn for 7 days - NG removed 6/24 - Diet advanced per surgery    Active Problems: Acute renal failure - Likely prerenal from GI losses, NG tube - Baseline Cr WNL and acutely elevated at 1.68 on this admission - Improved with IV fluids - Will order BMP for today   Normocytic anemia - Hemoglobin stable   History of atrial fibrillation - CHADS2 vasc score of 4 - Resume aggrenox once patient able to tolerate PO intake  - Rate controlled with metoprolol   Dementia / Depression - Resume namenda once tolerates PO  Thrombocytopenia - Mild and resolved by now  DVT Prophylaxis  - Heparin subQ ordered    Code Status: Partial code  Family Communication:  plan of care discussed with the patient's wife at the bedside  Disposition Plan: per surgery   IV access:  Peripheral IV  Procedures and diagnostic studies:    Ct Abdomen Pelvis W Contrast 11/30/2014   There is free intraperitoneal gas and  fatty inflammatory changes adjacent to small bowel loops compatible with a small bowel inflammatory process and rupture. There is no adjacent abscess. Critical Value/emergent results were called by telephone at the time of interpretation on 11/30/2014 at 2:17 pm to Dr. Verneda Skill, who verbally acknowledged these results.  Enlarging hyperdense lesion in the left kidney worrisome for neoplasm. MRI with contrast is recommended when feasible.    Medical Consultants:  Surgery   Other Consultants:  Physical therapy   IAnti-Infectives:   Zosyn 11/30/2014 --> 12/06/2014    Leisa Lenz, MD  [facility] Pager [phone]  Time spent in minutes: 15 minutes  If 7PM-7AM, please contact night-coverage [email or web address] Password [facility] 12/07/2014, 12:29 PM   LOS: 7 days    HPI/Subjective: No acute overnight events. No nausea or vomiting.   Objective: Filed Vitals:   12/06/14 0509 12/06/14 1500 12/06/14 2053 12/07/14 0532  BP: 152/49 131/60 136/76 132/90  Pulse: 59 64 70 66  Temp: 98.2 F (36.8 C) 98 F (36.7 C) 99.1 F (37.3 C) 98.1 F (36.7 C)  TempSrc:  Oral Oral Oral  Resp: 20 20 20 22   Height:      Weight: 82.1 kg (181 lb)     SpO2: 96% 96% 97% 95%    Intake/Output Summary (Last 24 hours) at 12/07/14 1229 Last data filed at 12/07/14 0900  Gross per 24 hour  Intake   4940 ml  Output    700 ml  Net   4240 ml    Exam:   General:  Pt  is alert, awake  Cardiovascular: irregular rhythm, (+) S1, S2  Respiratory: no wheezing, no crackles   Abdomen: non tender abdomen, (+) BS  Extremities: No leg swelling, pulses bilateral   Neuro: Non focal   Data Reviewed: Basic Metabolic Panel:  Recent Labs Lab 12/01/14 0445 12/02/14 0350 12/03/14 0350 12/04/14 0350 12/06/14  NA 137 135 136 137 138  K 4.4 4.1 4.1 4.1 4.0  CL 107 107 106 106 107  CO2 23 24 23 25 24   GLUCOSE 151* 130* 123* 120* 121*  BUN 20 20 14 11 10   CREATININE 1.59* 1.71* 1.51* 1.50* 1.54*  CALCIUM 8.1*  7.9* 8.0* 8.3* 8.5*   Liver Function Tests: No results for input(s): AST, ALT, ALKPHOS, BILITOT, PROT, ALBUMIN in the last 168 hours. No results for input(s): LIPASE, AMYLASE in the last 168 hours. No results for input(s): AMMONIA in the last 168 hours. CBC:  Recent Labs Lab 12/01/14 0445 12/02/14 0350 12/03/14 0350 12/04/14 0350 12/06/14  WBC 7.8 7.4 6.7 6.7 5.5  NEUTROABS  --  5.3  --   --   --   HGB 12.5* 10.7* 10.4* 11.2* 10.6*  HCT 38.4* 32.1* 31.8* 33.9* 31.7*  MCV 98.7 99.1 95.8 96.9 96.1  PLT 150 122* 132* 165 195   Cardiac Enzymes: No results for input(s): CKTOTAL, CKMB, CKMBINDEX, TROPONINI in the last 168 hours. BNP: Invalid input(s): POCBNP CBG: No results for input(s): GLUCAP in the last 168 hours.  Recent Results (from the past 240 hour(s))  MRSA PCR Screening     Status: None   Collection Time: 11/30/14 11:07 PM  Result Value Ref Range Status   MRSA by PCR NEGATIVE NEGATIVE Final     Scheduled Meds: . antiseptic oral rinse  7 mL Mouth Rinse q12n4p  . chlorhexidine  15 mL Mouth Rinse BID  . heparin  5,000 Units Subcutaneous 3 times per day  . loperamide  1 mg Oral Once  . metoprolol  5 mg Intravenous 3 times per day   Continuous Infusions: . dextrose 5 % and 0.45 % NaCl with KCl 20 mEq/L 100 mL/hr at 12/07/14 0530

## 2014-12-07 NOTE — Progress Notes (Signed)
7 Days Post-Op  Subjective: Had several loose BM's Tolerating clears  Objective: Vital signs in last 24 hours: Temp:  [98 F (36.7 C)-99.1 F (37.3 C)] 98.1 F (36.7 C) (06/25 0532) Pulse Rate:  [64-70] 66 (06/25 0532) Resp:  [20-22] 22 (06/25 0532) BP: (131-136)/(60-90) 132/90 mmHg (06/25 0532) SpO2:  [95 %-97 %] 95 % (06/25 0532) Last BM Date: 12/06/14  Intake/Output from previous day: 06/24 0701 - 06/25 0700 In: 4940 [P.O.:240; I.V.:4100; IV Piggyback:50] Out: 1450 [Urine:1450] Intake/Output this shift: Total I/O In: 240 [P.O.:240] Out: -   Abdomen soft, NT/ND  Lab Results:   Recent Labs  12/06/14  WBC 5.5  HGB 10.6*  HCT 31.7*  PLT 195   BMET  Recent Labs  12/06/14  NA 138  K 4.0  CL 107  CO2 24  GLUCOSE 121*  BUN 10  CREATININE 1.54*  CALCIUM 8.5*   PT/INR No results for input(s): LABPROT, INR in the last 72 hours. ABG No results for input(s): PHART, HCO3 in the last 72 hours.  Invalid input(s): PCO2, PO2  Studies/Results: No results found.  Anti-infectives: Anti-infectives    Start     Dose/Rate Route Frequency Ordered Stop   12/01/14 0100  piperacillin-tazobactam (ZOSYN) IVPB 3.375 g     3.375 g 12.5 mL/hr over 240 Minutes Intravenous Every 8 hours 11/30/14 1651 12/07/14 2359   11/30/14 1645  piperacillin-tazobactam (ZOSYN) IVPB 3.375 g     3.375 g 100 mL/hr over 30 Minutes Intravenous  Once 11/30/14 1642 11/30/14 1722      Assessment/Plan: s/p Procedure(s): LAPAROSCOPIC LYSIS OF ADHESIONS (N/A) SMALL BOWEL RESECTION (N/A)  Ileus resolving Will try full liquids Stop Zosyn   LOS: 7 days    [name] A 12/07/2014

## 2014-12-08 MED ORDER — PROPRANOLOL HCL ER 60 MG PO CP24
60.0000 mg | ORAL_CAPSULE | Freq: Every day | ORAL | Status: DC
  Administered 2014-12-08: 60 mg via ORAL
  Filled 2014-12-08 (×2): qty 1

## 2014-12-08 MED ORDER — ASPIRIN-DIPYRIDAMOLE ER 25-200 MG PO CP12
1.0000 | ORAL_CAPSULE | Freq: Two times a day (BID) | ORAL | Status: DC
  Administered 2014-12-08 – 2014-12-10 (×5): 1 via ORAL
  Filled 2014-12-08 (×4): qty 1

## 2014-12-08 MED ORDER — MEMANTINE HCL 10 MG PO TABS
10.0000 mg | ORAL_TABLET | Freq: Two times a day (BID) | ORAL | Status: DC
  Administered 2014-12-08 – 2014-12-10 (×5): 10 mg via ORAL
  Filled 2014-12-08 (×5): qty 1

## 2014-12-08 MED ORDER — DONEPEZIL HCL 23 MG PO TABS
23.0000 mg | ORAL_TABLET | Freq: Every day | ORAL | Status: DC
  Administered 2014-12-08 – 2014-12-09 (×2): 23 mg via ORAL
  Filled 2014-12-08 (×3): qty 1

## 2014-12-08 NOTE — Progress Notes (Signed)
Patient ID: [name], male   DOB: 01/17/1921, 81 y.o.   MRN: [id number] [facility] PROGRESS NOTE  DIERRE CREVIER HLK:[id number] DOB: Mar 15, 1921 DOA: 11/30/2014 PCP: Simona Huh, MD  Brief narrative:    81 y.o. male with a past medical history of dementia, CVA, paroxysmal atrial fibrillation on full dose anticoagulation with pradaxa. He presented to [facility] ED with reports of abdominal pain and has had near syncope. He was seen in urgent care center and was found to have small bowel perforation. Pt is status post diagnostic laparoscopy with enterolysis of adhesions and small bowel resection done 11/30/2014 by Dr. Lucia Gaskins. Postoperatively he has developed ileus and required NG tube which was clamped 6/23 and then discontinued 6/24. Anticipate he will be discharged soon if tolerates solid food.  Assessment/Plan:    Principal Problem: Small bowel perforation / postoperative ileus  - SBO secondary to adhesions. - Status post diagnostic laparoscopy with enterolysis of adhesions and small bowel resection done 11/30/2014 by Dr. Lucia Gaskins - Zas received zosyn for 7 days, stopped 6/24 - NG removed 6/24 - Diet advanced per surgery - soft food today    Active Problems: Acute renal failure - Likely prerenal from GI losses, NG tube - Baseline Cr WNL and acutely elevated at 1.68 on this admission - Improved with IV fluids  Normocytic anemia - Hemoglobin stable   History of atrial fibrillation - CHADS2 vasc score of 4 - I ordered aggrenox today since ok PO intake - Also changed to inderal and stopped metoprolol  Dementia / Depression - Resumes namenda and aricept from today   Thrombocytopenia - Mild and resolved by now  DVT Prophylaxis  - Heparin subQ ordered    Code Status: Partial code  Family Communication:  plan of care discussed with the patient's wife at the bedside  Disposition Plan: per surgery   - will sign off today 12/08/2014; please call if questions, [phone] or cell#  [phone]  IV access:  Peripheral IV  Procedures and diagnostic studies:    Ct Abdomen Pelvis W Contrast 11/30/2014   There is free intraperitoneal gas and fatty inflammatory changes adjacent to small bowel loops compatible with a small bowel inflammatory process and rupture. There is no adjacent abscess. Critical Value/emergent results were called by telephone at the time of interpretation on 11/30/2014 at 2:17 pm to Dr. Verneda Skill, who verbally acknowledged these results.  Enlarging hyperdense lesion in the left kidney worrisome for neoplasm. MRI with contrast is recommended when feasible.    Medical Consultants:  Surgery   Other Consultants:  Physical therapy   IAnti-Infectives:   Zosyn 11/30/2014 --> 12/06/2014    Leisa Lenz, MD  [facility] Pager [phone]  Time spent in minutes: 15 minutes  If 7PM-7AM, please contact night-coverage [email or web address] Password [facility] 12/08/2014, 10:35 AM   LOS: 8 days    HPI/Subjective: No acute overnight events. No nausea or vomiting.   Objective: Filed Vitals:   12/07/14 0532 12/07/14 1436 12/07/14 2113 12/08/14 0524  BP: 132/90 135/59 143/54 135/63  Pulse: 66 57 62 66  Temp: 98.1 F (36.7 C) 98.7 F (37.1 C) 98.9 F (37.2 C) 98.6 F (37 C)  TempSrc: Oral Oral Oral Oral  Resp: 22 20 20 20   Height:      Weight:      SpO2: 95% 98% 98% 100%    Intake/Output Summary (Last 24 hours) at 12/08/14 1035 Last data filed at 12/08/14 0600  Gross per 24 hour  Intake   2720  ml  Output   2175 ml  Net    545 ml    Exam:   General:  Pt is awake, no distress  Cardiovascular: irregular rhythm, appreciated S1, S2  Respiratory: bilateral air entry, no wheezing  Abdomen: appreciate BS, non tender   Extremities: No edema, pulses bilateral   Neuro: No focal deficits   Data Reviewed: Basic Metabolic Panel:  Recent Labs Lab 12/02/14 0350 12/03/14 0350 12/04/14 0350 12/06/14 12/07/14 1312  NA 135 136 137 138 137  K 4.1  4.1 4.1 4.0 3.7  CL 107 106 106 107 108  CO2 24 23 25 24 23   GLUCOSE 130* 123* 120* 121* 123*  BUN 20 14 11 10 9   CREATININE 1.71* 1.51* 1.50* 1.54* 1.48*  CALCIUM 7.9* 8.0* 8.3* 8.5* 8.3*   Liver Function Tests: No results for input(s): AST, ALT, ALKPHOS, BILITOT, PROT, ALBUMIN in the last 168 hours. No results for input(s): LIPASE, AMYLASE in the last 168 hours. No results for input(s): AMMONIA in the last 168 hours. CBC:  Recent Labs Lab 12/02/14 0350 12/03/14 0350 12/04/14 0350 12/06/14  WBC 7.4 6.7 6.7 5.5  NEUTROABS 5.3  --   --   --   HGB 10.7* 10.4* 11.2* 10.6*  HCT 32.1* 31.8* 33.9* 31.7*  MCV 99.1 95.8 96.9 96.1  PLT 122* 132* 165 195   Cardiac Enzymes: No results for input(s): CKTOTAL, CKMB, CKMBINDEX, TROPONINI in the last 168 hours. BNP: Invalid input(s): POCBNP CBG: No results for input(s): GLUCAP in the last 168 hours.  Recent Results (from the past 240 hour(s))  MRSA PCR Screening     Status: None   Collection Time: 11/30/14 11:07 PM  Result Value Ref Range Status   MRSA by PCR NEGATIVE NEGATIVE Final     Scheduled Meds: . antiseptic oral rinse  7 mL Mouth Rinse q12n4p  . chlorhexidine  15 mL Mouth Rinse BID  . heparin  5,000 Units Subcutaneous 3 times per day  . metoprolol  5 mg Intravenous 3 times per day   Continuous Infusions: . dextrose 5 % and 0.45 % NaCl with KCl 20 mEq/L 100 mL/hr at 12/08/14 [phone]

## 2014-12-08 NOTE — Clinical Social Work Note (Signed)
CSW met with pt and his spouse at bedside  Pt's wife stated that she wants either [facility] or [facility] for pt rehab and will make her final decision on Monday.  Both faciilites have accepted her and she is aware.  Week day CSW will follow up when pt is ready for discharge.  Dede Query, LCSW [facility] Worker - Weekend Coverage cell #: [phone]

## 2014-12-08 NOTE — Progress Notes (Signed)
8 Days Post-Op  Subjective: Tolerated diet yesterday No nausea or pain  Objective: Vital signs in last 24 hours: Temp:  [98.6 F (37 C)-98.9 F (37.2 C)] 98.6 F (37 C) (06/26 0524) Pulse Rate:  [57-66] 66 (06/26 0524) Resp:  [20] 20 (06/26 0524) BP: (135-143)/(54-63) 135/63 mmHg (06/26 0524) SpO2:  [98 %-100 %] 100 % (06/26 0524) Last BM Date: 12/07/14  Intake/Output from previous day: 06/25 0701 - 06/26 0700 In: 3310 [P.O.:960; I.V.:2300; IV Piggyback:50] Out: 2525 [Urine:2525] Intake/Output this shift:   Abdomen is soft, non distended  Lab Results:   Recent Labs  12/06/14  WBC 5.5  HGB 10.6*  HCT 31.7*  PLT 195   BMET  Recent Labs  12/06/14 12/07/14 1312  NA 138 137  K 4.0 3.7  CL 107 108  CO2 24 23  GLUCOSE 121* 123*  BUN 10 9  CREATININE 1.54* 1.48*  CALCIUM 8.5* 8.3*   PT/INR No results for input(s): LABPROT, INR in the last 72 hours. ABG No results for input(s): PHART, HCO3 in the last 72 hours.  Invalid input(s): PCO2, PO2  Studies/Results: No results found.  Anti-infectives: Anti-infectives    Start     Dose/Rate Route Frequency Ordered Stop   12/01/14 0100  piperacillin-tazobactam (ZOSYN) IVPB 3.375 g  Status:  Discontinued     3.375 g 12.5 mL/hr over 240 Minutes Intravenous Every 8 hours 11/30/14 1651 12/07/14 0937   11/30/14 1645  piperacillin-tazobactam (ZOSYN) IVPB 3.375 g     3.375 g 100 mL/hr over 30 Minutes Intravenous  Once 11/30/14 1642 11/30/14 1722      Assessment/Plan: s/p Procedure(s): LAPAROSCOPIC LYSIS OF ADHESIONS (N/A) SMALL BOWEL RESECTION (N/A)  Ileus resolving cdiff neg Advance diet  LOS: 8 days    Brentlee Delage A 12/08/2014

## 2014-12-09 MED ORDER — POLYETHYLENE GLYCOL 3350 17 G PO PACK
17.0000 g | PACK | Freq: Every day | ORAL | Status: DC
  Administered 2014-12-09: 17 g via ORAL
  Filled 2014-12-09 (×2): qty 1

## 2014-12-09 MED ORDER — BISACODYL 10 MG RE SUPP: 10.0000 mg | Freq: Every day | RECTAL | Status: DC | PRN

## 2014-12-09 MED ORDER — PROPRANOLOL HCL ER 60 MG PO CP24
60.0000 mg | ORAL_CAPSULE | Freq: Every day | ORAL | Status: DC
  Administered 2014-12-09 – 2014-12-10 (×2): 60 mg via ORAL
  Filled 2014-12-09 (×2): qty 1

## 2014-12-09 NOTE — Progress Notes (Signed)
9 Days Post-Op  Subjective: He looks fine, they have not walked him much, he has been up to the chair.  Taking a soft diet, not allot, but keeping it down.  He has some mucus from his rectum 2 days ago, no BM since surgery.  He had issues with constipation pre op, worse on Namenda   Objective: Vital signs in last 24 hours: Temp:  [98.3 F (36.8 C)-98.8 F (37.1 C)] 98.8 F (37.1 C) (06/27 0457) Pulse Rate:  [56-76] 56 (06/27 0457) Resp:  [18-20] 20 (06/27 0457) BP: (131-144)/(57-68) 144/62 mmHg (06/27 0457) SpO2:  [56 %-100 %] 56 % (06/27 0457) Last BM Date: 12/07/14 720 PO recorded  Soft diet No BM recorded Afebrile, VSS Creatinine is stable Intake/Output from previous day: 06/26 0701 - 06/27 0700 In: 2582.5 [P.O.:720; I.V.:1862.5] Out: 1600 [Urine:1600] Intake/Output this shift:    General appearance: alert, cooperative, no distress and his wife does all the talking.   Resp: clear to auscultation bilaterally GI: soft, non-tender; bowel sounds normal; no masses,  no organomegaly and His abdominal wound is open, but clean.  Lab Results:  No results for input(s): WBC, HGB, HCT, PLT in the last 72 hours.  BMET  Recent Labs  12/07/14 1312  NA 137  K 3.7  CL 108  CO2 23  GLUCOSE 123*  BUN 9  CREATININE 1.48*  CALCIUM 8.3*   PT/INR No results for input(s): LABPROT, INR in the last 72 hours.  No results for input(s): AST, ALT, ALKPHOS, BILITOT, PROT, ALBUMIN in the last 168 hours.   Lipase     Component Value Date/Time   LIPASE 22 11/30/2014 1150     Studies/Results: No results found.  Medications: . antiseptic oral rinse  7 mL Mouth Rinse q12n4p  . chlorhexidine  15 mL Mouth Rinse BID  . dipyridamole-aspirin  1 capsule Oral BID  . donepezil  23 mg Oral QHS  . heparin  5,000 Units Subcutaneous 3 times per day  . memantine  10 mg Oral BID  . propranolol ER  60 mg Oral Daily   Scheduled Meds: . antiseptic oral rinse  7 mL Mouth Rinse q12n4p  .  chlorhexidine  15 mL Mouth Rinse BID  . dipyridamole-aspirin  1 capsule Oral BID  . donepezil  23 mg Oral QHS  . heparin  5,000 Units Subcutaneous 3 times per day  . memantine  10 mg Oral BID  . polyethylene glycol  17 g Oral Daily  . propranolol ER  60 mg Oral Daily   Continuous Infusions: . dextrose 5 % and 0.45 % NaCl with KCl 20 mEq/L 75 mL/hr at 12/08/14 0843   PRN Meds:.bisacodyl, chlorproMAZINE (THORAZINE) IV, diphenhydrAMINE, hydrocortisone cream, morphine injection, ondansetron **OR** ondansetron (ZOFRAN) IV   Prior to Admission medications   Medication Sig Start Date End Date Taking? Authorizing Provider  dipyridamole-aspirin (AGGRENOX) 200-25 MG per 12 hr capsule Take 1 capsule by mouth 2 (two) times daily.   Yes Historical Provider, MD  donepezil (ARICEPT) 23 MG TABS tablet TAKE 1 TABLET BY MOUTH AT BEDTIME 11/03/14  Yes Garvin Fila, MD  fexofenadine (ALLEGRA) 180 MG tablet Take 180 mg by mouth daily.   Yes Historical Provider, MD  fluticasone (FLONASE) 50 MCG/ACT nasal spray Place 2 sprays into the nose at bedtime. 01/01/13  Yes Historical Provider, MD  memantine (NAMENDA) 10 MG tablet Take 10 mg by mouth 2 (two) times daily. 10/22/14  Yes Historical Provider, MD  Multiple Vitamins-Minerals (ICAPS LUTEIN &  ZEAXANTHIN) TBEC Take 1 tablet by mouth daily.   Yes Historical Provider, MD  multivitamin-iron-minerals-folic acid (CENTRUM) chewable tablet Chew 1 tablet by mouth daily.   Yes Historical Provider, MD  propranolol ER (INDERAL LA) 60 MG 24 hr capsule Take 1 capsule (60 mg total) by mouth daily. 02/19/13  Yes Philmore Pali, NP  vitamin E 400 UNIT capsule Take 400 Units by mouth as directed.   Yes Historical Provider, MD  QUEtiapine (SEROQUEL) 25 MG tablet TAKE ONE-HALF (1/2) TABLET BY MOUTH AT BEDTIME Patient not taking: Reported on 11/30/2014 09/11/14   Garvin Fila, MD    Assessment/Plan 1. 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 Extensive small bowel adhesions.  Mesh right upper abdomen. Hernia defects below mesh and in midline S/p Diagnostic laparoscopy, Laparoscopic enterolysis of adhesions for 100 minutes, open open small bowel resection, 11/30/14, Dr. Alphonsa Overall POD# 9 2. S/p Right hemicolectomy 09/12/08 Dr. 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 Sx of SVT 4. Hx of left kidney mass 5. Hx of CVA 6. Hx of memory loss/Dementia 7. Renal insuffiencey Creatinine is still elevated 1.48 but stable. 8. Antibiotics: Zosyn 8 days, last dose 12/07/14 9. DVT: Heparin/SCD    Plan:  I will give him a suppository and add Miralax.  He has PT and OT consults in, but I don't see anything from them since 6/23 OT note.  His heart rate was down this AM and place parameter on Propanolol. He is back on his preop dementia medicines I think once we get his bowels working we can discuss placement.  His wife understands this and is working on this with Social services.     LOS: 9 days    Izzabelle Bouley 12/09/2014

## 2014-12-09 NOTE — Progress Notes (Signed)
CSW continuing to follow for pt disposition needs.  CSW followed up with pt wife and pt family members at bedside this morning. Pt resting comfortably at this time. CSW discussed with pt wife that pt not yet medically ready for discharge, but CSW wanted to discuss with pt wife if pt wife had made a decision regarding SNF. Pt wife discussed that she was able to think about SNF more and look into the options further. Pt wife discussed that her decision is now between [facility] and [facility] [place]. Pt family assisted pt wife in discussing pros and cons of each option and pt wife plans to tour [facility] and [facility] as the facility is very convenient for pt wife.   CSW later received phone call from pt wife stating that she was able to tour [facility] and [facility] and pt and pt family would like for pt to go to [facility] and [facility] when pt medically ready for discharge.   CSW contacted [facility] and [facility] and notified facility of acceptance of bed offer and confirmed that facility can accept pt when medically ready for discharge.  CSW notified pt wife that CSW confirmed that [facility] and [facility] can accept pt when medically ready.  CSW to continue to follow to provide support and assist with pt transition to [facility] and [facility] when medically ready for discharge.  Alison Murray, MSW, [place] Work [phone]

## 2014-12-09 NOTE — Progress Notes (Signed)
Physical Therapy Treatment Patient Details Name: [name] MRN: [id number] DOB: [birth year] Today's Date: 12-28-14    History of Present Illness Pt is a 81 year old male admitted 11/30/14 secondary to a perforated jejunal diverticula. Underwent Laparoscopic lysis of adhesion and small bowel resection on 11/30/14. PMHx: abd surgeries, dementia, CVA, afib    PT Comments    Pt mobility improved since previous session however continues to require at least min assist for transfers and ambulation.  Pt with loose BM which limited ambulation distance and required increased time for hygiene.  Follow Up Recommendations  SNF     Equipment Recommendations  None recommended by PT    Recommendations for Other Services       Precautions / Restrictions Precautions Precautions: Fall    Mobility  Bed Mobility Overal bed mobility: Needs Assistance Bed Mobility: Sit to Supine       Sit to supine: Min assist   General bed mobility comments: assist for LEs  Transfers Overall transfer level: Needs assistance Equipment used: Rolling walker (2 wheeled) Transfers: Sit to/from [facility] Sit to Stand: Min assist;+2 safety/equipment Stand pivot transfers: Min assist;+2 safety/equipment       General transfer comment: verbal cues for hand placement, assist to rise and steady  Ambulation/Gait Ambulation/Gait assistance: Min assist;+2 physical assistance Ambulation Distance (Feet): 140 Feet Assistive device: Rolling walker (2 wheeled) Gait Pattern/deviations: Step-through pattern;Trunk flexed;Decreased stride length     General Gait Details: assist to stabilize pt, multimodal cues for RW positioning and upright posture, pt with loose BM so had pt sit in his recliner (recliner following for safety)   Stairs            Wheelchair Mobility    Modified Rankin (Stroke Patients Only)       Balance Overall balance assessment: Needs assistance          Standing balance support: Bilateral upper extremity supported;During functional activity Standing balance-Leahy Scale: Poor Standing balance comment: requires UE support with RW                    Cognition Arousal/Alertness: Awake/alert Behavior During Therapy: WFL for tasks assessed/performed Overall Cognitive Status: History of cognitive impairments - at baseline                      Exercises      General Comments        Pertinent Vitals/Pain Pain Assessment: No/denies pain    Home Living                      Prior Function            PT Goals (current goals can now be found in the care plan section) Progress towards PT goals: Progressing toward goals    Frequency  Min 3X/week    PT Plan Current plan remains appropriate    Co-evaluation             End of Session Equipment Utilized During Treatment: Gait belt Activity Tolerance: Patient tolerated treatment well Patient left: in bed;with call bell/phone within reach;with family/visitor present;with bed alarm set     Time: 1520-1550 PT Time Calculation (min) (ACUTE ONLY): 30 min  Charges:  $Gait Training: 8-22 mins                    G Codes:      Harjot Zavadil,KATHrine E 12/28/2014, 5:03 PM Carmelia Bake, PT,  DPT 12/09/2014 Pager: [phone]

## 2014-12-09 NOTE — Care Management (Signed)
IM LETTER GIVEN TO PATIENT  

## 2014-12-09 NOTE — Clinical Social Work Placement (Signed)
   CLINICAL SOCIAL WORK PLACEMENT  NOTE  Date:  12/09/2014  Patient Details  Name: [name] MRN: [id number] Date of Birth: August 28, 1920  Clinical Social Work is seeking post-discharge placement for this patient at the [place] level of care (*CSW will initial, date and re-position this form in  chart as items are completed):  Yes   Patient/family provided with [place] Work Department's list of facilities offering this level of care within the geographic area requested by the patient (or if unable, by the patient's family).  Yes   Patient/family informed of their freedom to choose among providers that offer the needed level of care, that participate in Medicare, Medicaid or managed care program needed by the patient, have an available bed and are willing to accept the patient.  Yes   Patient/family informed of [facility]'s ownership interest in [facility] and [facility], as well as of the fact that they are under no obligation to receive care at these facilities.  PASRR submitted to EDS on 12/06/14     PASRR number received on 12/06/14     Existing PASRR number confirmed on       FL2 transmitted to all facilities in geographic area requested by pt/family on 12/06/14     FL2 transmitted to all facilities within larger geographic area on       Patient informed that his/her managed care company has contracts with or will negotiate with certain facilities, including the following:        Yes   Patient/family informed of bed offers received.  Patient chooses bed at [facility]     Physician recommends and patient chooses bed at      Patient to be transferred to   on  .  Patient to be transferred to facility by       Patient family notified on   of transfer.  Name of family member notified:        PHYSICIAN Please sign FL2     Additional Comment:    _______________________________________________ Ladell Pier,  LCSW 12/09/2014, 3:38 PM

## 2014-12-10 DIAGNOSIS — G8911 Acute pain due to trauma: Secondary | ICD-10-CM | POA: Diagnosis not present

## 2014-12-10 DIAGNOSIS — R269 Unspecified abnormalities of gait and mobility: Secondary | ICD-10-CM | POA: Diagnosis not present

## 2014-12-10 DIAGNOSIS — R278 Other lack of coordination: Secondary | ICD-10-CM | POA: Diagnosis not present

## 2014-12-10 DIAGNOSIS — N179 Acute kidney failure, unspecified: Secondary | ICD-10-CM | POA: Diagnosis not present

## 2014-12-10 DIAGNOSIS — G309 Alzheimer's disease, unspecified: Secondary | ICD-10-CM | POA: Diagnosis not present

## 2014-12-10 DIAGNOSIS — I63412 Cerebral infarction due to embolism of left middle cerebral artery: Secondary | ICD-10-CM | POA: Diagnosis not present

## 2014-12-10 DIAGNOSIS — F0390 Unspecified dementia without behavioral disturbance: Secondary | ICD-10-CM | POA: Diagnosis not present

## 2014-12-10 DIAGNOSIS — Z8673 Personal history of transient ischemic attack (TIA), and cerebral infarction without residual deficits: Secondary | ICD-10-CM | POA: Diagnosis not present

## 2014-12-10 DIAGNOSIS — K5900 Constipation, unspecified: Secondary | ICD-10-CM | POA: Diagnosis not present

## 2014-12-10 DIAGNOSIS — D509 Iron deficiency anemia, unspecified: Secondary | ICD-10-CM | POA: Diagnosis not present

## 2014-12-10 DIAGNOSIS — K631 Perforation of intestine (nontraumatic): Secondary | ICD-10-CM | POA: Diagnosis not present

## 2014-12-10 DIAGNOSIS — T7840XA Allergy, unspecified, initial encounter: Secondary | ICD-10-CM | POA: Diagnosis not present

## 2014-12-10 DIAGNOSIS — E560 Deficiency of vitamin E: Secondary | ICD-10-CM | POA: Diagnosis not present

## 2014-12-10 DIAGNOSIS — Z48815 Encounter for surgical aftercare following surgery on the digestive system: Secondary | ICD-10-CM | POA: Diagnosis not present

## 2014-12-10 DIAGNOSIS — R251 Tremor, unspecified: Secondary | ICD-10-CM | POA: Diagnosis not present

## 2014-12-10 DIAGNOSIS — M6281 Muscle weakness (generalized): Secondary | ICD-10-CM | POA: Diagnosis not present

## 2014-12-10 DIAGNOSIS — K5669 Other intestinal obstruction: Secondary | ICD-10-CM | POA: Diagnosis not present

## 2014-12-10 DIAGNOSIS — I471 Supraventricular tachycardia: Secondary | ICD-10-CM | POA: Diagnosis not present

## 2014-12-10 MED ORDER — ACETAMINOPHEN 325 MG PO TABS: 650.0000 mg | ORAL_TABLET | ORAL | Status: DC | PRN

## 2014-12-10 MED ORDER — POLYETHYLENE GLYCOL 3350 17 G PO PACK: 17.0000 g | PACK | Freq: Every day | ORAL | Status: AC | PRN

## 2014-12-10 NOTE — Progress Notes (Signed)
Occupational Therapy Treatment Patient Details Name: [name] MRN: [id number] DOB: 09/05/1920 Today's Date: 12/10/2014    History of present illness Pt is a 81 year old male admitted 11/30/14 secondary to a perforated jejunal diverticula. Underwent Laparoscopic lysis of adhesion and small bowel resection on 11/30/14. PMHx: abd surgeries, dementia, CVA, afib   OT comments  Pt requires +2 for safety with walker to transfer into bathroom for safety as he tends to push the walker too far in front of him. He was incontinent of stool on the way into bathroom so assisted with cleanup. Pt limited by intention tremors with trying to don socks and required assist. Wife present for session. Pt planning SNF.   Follow Up Recommendations  SNF;Supervision/Assistance - 24 hour    Equipment Recommendations  None recommended by OT    Recommendations for Other Services      Precautions / Restrictions Precautions Precautions: Fall Restrictions Weight Bearing Restrictions: No       Mobility Bed Mobility               General bed mobility comments: in chair.   Transfers Overall transfer level: Needs assistance Equipment used: Rolling walker (2 wheeled)   Sit to Stand: +2 safety/equipment;Min assist         General transfer comment: verbal cues for safety and hand placement.     Balance                                   ADL       Grooming: Wash/dry face;Set up;Sitting               Lower Body Dressing: Total assistance;Sitting/lateral leans Lower Body Dressing Details (indicate cue type and reason): total assist with socks seated.  Toilet Transfer: +2 for safety/equipment;Moderate assistance;RW;Ambulation;Comfort height toilet             General ADL Comments: Pt up to transfer into the bathroom with walker but needs constant cues to stay closer to the walker. He tends to push walker too far and is very forward flexed with posture and gave cues to  look up and see where he is walking. Wife present. Min assist for straight path with walker but mod assist for turns to step around to commode safely as pt with urgency of BM and stepping away from walker. Pt incontinent of stool on the way to bathroom. Required assist for BM cleanup and periarea hygiene.       Vision                     Perception     Praxis      Cognition   Behavior During Therapy: WFL for tasks assessed/performed Overall Cognitive Status: History of cognitive impairments - at baseline                       Extremity/Trunk Assessment               Exercises     Shoulder Instructions       General Comments      Pertinent Vitals/ Pain       Pain Assessment: No/denies pain  Home Living  Prior Functioning/Environment              Frequency Min 2X/week     Progress Toward Goals  OT Goals(current goals can now be found in the care plan section)  Progress towards OT goals: Progressing toward goals     Plan Discharge plan remains appropriate    Co-evaluation                 End of Session Equipment Utilized During Treatment: Gait belt;Rolling walker   Activity Tolerance Patient tolerated treatment well   Patient Left in chair;with call bell/phone within reach;with family/visitor present   Nurse Communication          Time: 4401-0272 OT Time Calculation (min): 27 min  Charges: OT General Charges $OT Visit: 1 Procedure OT Treatments $Self Care/Home Management : 8-22 mins $Therapeutic Activity: 8-22 mins  [name]  [phone] 12/10/2014, 9:36 AM

## 2014-12-10 NOTE — Clinical Social Work Placement (Signed)
   CLINICAL SOCIAL WORK PLACEMENT  NOTE  Date:  12/10/2014  Patient Details  Name: [name] MRN: [id number] Date of Birth: 02-07-1921  Clinical Social Work is seeking post-discharge placement for this patient at the [place] level of care (*CSW will initial, date and re-position this form in  chart as items are completed):  Yes   Patient/family provided with [place] Work Department's list of facilities offering this level of care within the geographic area requested by the patient (or if unable, by the patient's family).  Yes   Patient/family informed of their freedom to choose among providers that offer the needed level of care, that participate in Medicare, Medicaid or managed care program needed by the patient, have an available bed and are willing to accept the patient.  Yes   Patient/family informed of [facility]'s ownership interest in [facility] and [facility], as well as of the fact that they are under no obligation to receive care at these facilities.  PASRR submitted to EDS on 12/06/14     PASRR number received on 12/06/14     Existing PASRR number confirmed on       FL2 transmitted to all facilities in geographic area requested by pt/family on 12/06/14     FL2 transmitted to all facilities within larger geographic area on       Patient informed that his/her managed care company has contracts with or will negotiate with certain facilities, including the following:        Yes   Patient/family informed of bed offers received.  Patient chooses bed at [facility]     Physician recommends and patient chooses bed at      Patient to be transferred to [facility] on 12/10/14.  Patient to be transferred to facility by ambulance Corey Harold)     Patient family notified on 12/10/14 of transfer.  Name of family member notified:  pt and pt wife notified at bedside     PHYSICIAN Please sign FL2     Additional Comment:     _______________________________________________ Ladell Pier, LCSW 12/10/2014, 1:52 PM

## 2014-12-10 NOTE — Progress Notes (Signed)
Pt for discharge [facility].  CSW facilitated pt discharge needs including contacting facility, faxing pt discharge information via [facility], discussing with pt and pt wife at bedside, providing RN phone number to call report, and arranging ambulance transport for pt to [facility].   Pt wife eager for pt to get to [facility] and [facility] for pt to have continued rehab.  No further social work needs identified at this time.  CSW signing off.   Alison Murray, MSW, [place] Work [phone]

## 2014-12-10 NOTE — Progress Notes (Signed)
Nutrition Follow-up  DOCUMENTATION CODES:  Not applicable  INTERVENTION: - Will monitor for needs if unable to d/c  NUTRITION DIAGNOSIS:  Inadequate oral intake related to inability to eat as evidenced by NPO status. -resolving with diet advancement and current intakes  GOAL:  Patient will meet greater than or equal to 90% of their needs -unmet on average  MONITOR:  PO intake, Weight trends, Labs  ASSESSMENT: 81 year old male with past medical history significant for prior stroke, essential tremor, paroxysmal A. fib, arthritis, dementia, presenting to the ED for abdominal pain. Pain was sudden onset this morning around 0700. Pain localized throughout his entire lower abdomen, described as sharp and stabbing. There is no radiation of pain. No nausea, vomiting, or diarrhea. Pain is significantly worse with movement. Appears to have perforation of small bowel to the left of midline near the umbilicus. Underwent emergent laparotomy 11/30/14.  POD #10 diagnostic laparoscopy, laparoscopic enterolysis of adhesions, open small bowel resection. Diet advanced from NPO to CLD 6/24, to [place] 6/25, and to Soft diet 6/26. Pt consumed 50% breakfast, 100% lunch, 15% dinner 6/26 and 50% breakfast, 30% both lunch and dinner yesterday. Variably meeting needs.  D/c summary in this AM. If pt unable to d/c will order supplements. Medications reviewed. Labs reviewed; creatinine elevated, Ca: 8.3 mg/dL, GFR: 40.  Height:  Ht Readings from Last 1 Encounters:  12/04/14 5\' 10"  (1.778 m)    Weight:  Wt Readings from Last 1 Encounters:  12/06/14 181 lb (82.1 kg)    Ideal Body Weight:  75.45 kg (kg)  Wt Readings from Last 10 Encounters:  12/06/14 181 lb (82.1 kg)  08/29/14 186 lb 12.8 oz (84.732 kg)  02/28/14 194 lb 6.4 oz (88.179 kg)  07/10/13 192 lb 8 oz (87.317 kg)  01/09/13 196 lb (88.905 kg)    BMI:  Body mass index is 25.97 kg/(m^2).  Estimated Nutritional Needs:  Kcal:   1700-1900  Protein:  70-80 grams  Fluid:  2-2.2 L/day  Skin:  Wound (see comment) (abdominal surgical wound)  Diet Order:  DIET SOFT Room service appropriate?: Yes; Fluid consistency:: Thin  EDUCATION NEEDS:  No education needs identified at this time   Intake/Output Summary (Last 24 hours) at 12/10/14 1025 Last data filed at 12/10/14 0600  Gross per 24 hour  Intake 1677.08 ml  Output   2400 ml  Net -722.92 ml    Last BM:  6/28    Jarome Matin, RD, LDN Inpatient Clinical Dietitian Pager # [phone] After hours/weekend pager # [phone]

## 2014-12-10 NOTE — Progress Notes (Signed)
Report called to nursing supervisor at [facility], patient is waiting for transportation (EM[facility]).

## 2014-12-10 NOTE — Discharge Summary (Signed)
Physician Discharge Summary  Patient ID: [name] MRN: [id number] DOB/AGE: [birth year] 81 y.o.  Admit date: 11/30/2014 Discharge date: 12/10/2014  Admission Diagnoses:   1. Probable perforation small bowel 2. Laparoscopic converted to open right hemicolectomy - 09/12/2008 - [facility] 3. History of contained perforation of jejunal diverticula - ex lap with SB resection - 04/05/2007 - [facility] 4. History of ventral hernia repair with polyester mesh (Parietex), upper abdomen - 2005 - [facility] 5. Tremors 6. Moderate senile dementia He sees Dr. Royal Hawthorn. Placed on [place] in 08/2014 7. Remote left [place] brain stroke - 2006 8. Remote history of [place] Supraven tachycardias Saw Dr. Tamala Julian 9. Left kidney mass seen on CT scan. Discharge Diagnoses:  1.  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 Extensive small bowel adhesions. Mesh right upper abdomen. Hernia defects below mesh and in midline. 2. Laparoscopic converted to open right hemicolectomy - 09/12/2008 - [facility] 3. History of contained perforation of jejunal diverticula - ex lap with SB resection - 04/05/2007 - [facility] 4. History of ventral hernia repair with polyester mesh (Parietex), upper abdomen - 2005 - [facility] 5. Tremors on BB for this 6. Moderate senile dementia He sees Dr. Royal Hawthorn. Placed on [place] in 08/2014 7. Remote left [place] brain stroke - 2006 8. Remote history of Parox Supraven tachycardias Saw Dr. Tamala Julian 9. Left kidney mass seen on CT scan;  renal function is stable Active Problems:   Dementia, senile with delusions   Bowel perforation   ARF (acute renal failure)   Small bowel perforation   PROCEDURES: Diagnostic laparoscopy, Laparoscopic enterolysis of adhesions for 100 minutes, open open small bowel resection, 11/30/14, [facility]. Lucia Gaskins, M.D.  Hospital Course:  [name] is a 81 y.o. (DOB: 1921/01/21) white male  whose primary care physician is Simona Huh, MD and comes to the [facility] ER for abdominal pain.  The patient lives with his wife at home. He has moderate dementia. He did not participate in his history or discussion of his care. His wife said that he was not active yesterday, but did not complain of pain. His last BM was about 2 days ago. He awoke this morning complaiing of stomach pain. His wife said that he almost passed out. His wife said that he walks with a walker, but cannot walk very far. He was brought tot he [facility] for evaluation.  He has had multiple abdominal operations: Laparoscopic converted to open right hemicolectomy - 09/12/2008 - [facility] History of contained perforation of jejunal diverticula - ex lap with SB resection - 04/05/2007 - [facility] History of ventral hernia repair with mesh, upper abdomen - 2005 - [facility] Open cholecystectomy - 02/04/2003 -[facility].   CT scan of abdomen - 11/30/2014 - There is free intraperitoneal gas and fatty inflammatory changes adjacent to small bowel loops compatible with a small bowel inflammatory process and rupture. There is no adjacent abscess. Critical Value/emergent results were called by telephone at the time of interpretation on 11/30/2014 at 2:17 pm to Dr. Verneda Skill, who verbally acknowledged these results. Enlarging hyperdense lesion in the left kidney worrisome forneoplasm. MRI with contrast is recommended when feasible. He was seen in the ED by Medicine and Dr. Lucia Gaskins.  Dr. Lucia Gaskins took patient to the OR that evening.   He underwent the above noted procedure.  Post op he had an ileus.  His renal function has been stable.  Medicine restarted his preadmission medicines, as his bowel function returned.  We advanced his diet and by 6/27 he was  tolerating a soft diet.  He had issues with constipation pre op and we added some Miralax with good results.  He has issues with stability, and  generalized weakness after his illness. We had OT and PT work with him and everyone agreed he would need SNF for rehab before going home.  His wife is agreeable with this and a facility has been chosen.  He is back on preadmit meds, he is tolerating a diet, his renal function is stable, and he is having bowel movements now.  It is our opinion he is ready for discharge to SNF today.  He will follow up with Dr. Lucia Gaskins in 2 weeks, and he is to follow up with Dr. Marisue Humble after discharge from SNF.  We did not address the left renal mass seen on CT scan.    Condition on d/c:  improved    Disposition:   Discharge Instructions    Change dressing    Complete by:  As directed   Wet to dry dressing with normal saline, paper tape to site Bid            Medication List    TAKE these medications        acetaminophen 325 MG tablet  Commonly known as:  TYLENOL  Take 2 tablets (650 mg total) by mouth every 4 (four) hours as needed.     dipyridamole-aspirin 200-25 MG per 12 hr capsule  Commonly known as:  AGGRENOX  Take 1 capsule by mouth 2 (two) times daily.     donepezil 23 MG Tabs tablet  Commonly known as:  ARICEPT  TAKE 1 TABLET BY MOUTH AT BEDTIME     fexofenadine 180 MG tablet  Commonly known as:  ALLEGRA  Take 180 mg by mouth daily.     fluticasone 50 MCG/ACT nasal spray  Commonly known as:  FLONASE  Place 2 sprays into the nose at bedtime.     memantine 10 MG tablet  Commonly known as:  NAMENDA  Take 10 mg by mouth 2 (two) times daily.     multivitamin-iron-minerals-folic acid chewable tablet  Chew 1 tablet by mouth daily.     polyethylene glycol packet  Commonly known as:  MIRALAX / GLYCOLAX  Take 17 g by mouth daily as needed for moderate constipation (If no BM in 24 hours as needed.).     propranolol ER 60 MG 24 hr capsule  Commonly known as:  INDERAL LA  Take 1 capsule (60 mg total) by mouth daily.     QUEtiapine 25 MG tablet  Commonly known as:  SEROQUEL  TAKE  ONE-HALF (1/2) TABLET BY MOUTH AT BEDTIME     vitamin E 400 UNIT capsule  Take 400 Units by mouth as directed.           Follow-up Information    Schedule an appointment as soon as possible for a visit with Simona Huh, MD.   Specialty:  Family Medicine   Why:  Call for follow up after he gets out of Rehab facility.   Contact information:   [address] [place] [address] [phone]       Follow up with [facility], MD. Schedule an appointment as soon as possible for a visit in 2 weeks.   Specialty:  General Surgery   Why:  for follow up after surgery.  Call sooner if there is a problem.   Contact information:   [address]  [place] [phone]       Signed: Earnstine Regal 12/10/2014, 9:50 AM

## 2014-12-10 NOTE — Progress Notes (Signed)
10 Days Post-Op  Subjective: OT and PT both working with him and he still needs help, very shaky going Bathroom.  He has had 3 BM's since yesterday.  Tolerating soft diet.  Objective: Vital signs in last 24 hours: Temp:  [98.3 F (36.8 C)-98.7 F (37.1 C)] 98.3 F (36.8 C) (06/28 0416) Pulse Rate:  [56-64] 56 (06/28 0416) Resp:  [16] 16 (06/28 0416) BP: (124-138)/(49-54) 138/54 mmHg (06/28 0416) SpO2:  [95 %] 95 % (06/28 0416) Last BM Date: 12/09/14 600 PO recorded  Soft diet 2400 urine Bm x 2 Afebrile, VSS No labs Intake/Output from previous day: 06/27 0701 - 06/28 0700 In: 1917.1 [P.O.:600; I.V.:1317.1] Out: 2400 [Urine:2400] Intake/Output this shift:    General appearance: alert, cooperative and no distress Resp: clear to auscultation bilaterally GI: soft sore, open wound is healing nicely.  + BS, +BM x 3  Lab Results:  No results for input(s): WBC, HGB, HCT, PLT in the last 72 hours.  BMET  Recent Labs  12/07/14 1312  NA 137  K 3.7  CL 108  CO2 23  GLUCOSE 123*  BUN 9  CREATININE 1.48*  CALCIUM 8.3*   PT/INR No results for input(s): LABPROT, INR in the last 72 hours.  No results for input(s): AST, ALT, ALKPHOS, BILITOT, PROT, ALBUMIN in the last 168 hours.   Lipase     Component Value Date/Time   LIPASE 22 11/30/2014 1150     Studies/Results: No results found.  Medications: . antiseptic oral rinse  7 mL Mouth Rinse q12n4p  . chlorhexidine  15 mL Mouth Rinse BID  . dipyridamole-aspirin  1 capsule Oral BID  . donepezil  23 mg Oral QHS  . heparin  5,000 Units Subcutaneous 3 times per day  . memantine  10 mg Oral BID  . polyethylene glycol  17 g Oral Daily  . propranolol ER  60 mg Oral Daily    Assessment/Plan 1. 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 Extensive small bowel adhesions. Mesh right upper abdomen. Hernia defects below mesh and in midline S/p Diagnostic laparoscopy, Laparoscopic enterolysis of adhesions for 100  minutes, open open small bowel resection, 11/30/14, Dr. Alphonsa Overall POD# 10 2. S/p Right hemicolectomy 09/12/08 Dr. 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 Sx of SVT 4. Hx of left kidney mass 5. Hx of CVA 6. Hx of memory loss/Dementia 7. Renal insuffiencey Creatinine is still elevated 1.48 but stable. 8. Antibiotics: Zosyn 8 days, last dose 12/07/14 9. DVT: Heparin/SCD   Plan:  I talked with the wife and answered as many questions as I could.  She agrees she cannot care for him by herself currently.  A bed is available and we will let him go today.    LOS: 10 days    Mykayla Brinton 12/10/2014

## 2014-12-10 NOTE — Discharge Instructions (Signed)
[facility]      [facility] °[phone] ° °OPEN ABDOMINAL SURGERY: POST OP INSTRUCTIONS ° °Always review your discharge instruction sheet given to you by the facility where your surgery was performed. ° °IF YOU HAVE DISABILITY OR FAMILY LEAVE FORMS, YOU MUST BRING THEM TO THE OFFICE FOR PROCESSING.  PLEASE DO NOT GIVE THEM TO YOUR DOCTOR. ° °1. A prescription for pain medication may be given to you upon discharge.  Take your pain medication as prescribed, if needed.  If narcotic pain medicine is not needed, then you may take acetaminophen (Tylenol) or ibuprofen (Advil) as needed. °2. Take your usually prescribed medications unless otherwise directed. °3. If you need a refill on your pain medication, please contact your pharmacy. They will contact our office to request authorization.  Prescriptions will not be filled after 5pm or on week-ends. °4. You should follow a light diet the first few days after arrival home, such as soup and crackers, pudding, etc.unless your doctor has advised otherwise. A high-fiber, low fat diet can be resumed as tolerated.   Be sure to include lots of fluids daily. Most patients will experience some swelling and bruising on the chest and neck area.  Ice packs will help.  Swelling and bruising can take several days to resolve °5. Most patients will experience some swelling and bruising in the area of the incision. Ice pack will help. Swelling and bruising can take several days to resolve..  °6. It is common to experience some constipation if taking pain medication after surgery.  Increasing fluid intake and taking a stool softener will usually help or prevent this problem from occurring.  A mild laxative (Milk of Magnesia or Miralax) should be taken according to package directions if there are no bowel movements after 48 hours. °7.  You may have steri-strips (small skin tapes) in place directly over the incision.  These strips should be left on the skin for 7-10 days.  If your  surgeon used skin glue on the incision, you may shower in 24 hours.  The glue will flake off over the next 2-3 weeks.  Any sutures or staples will be removed at the office during your follow-up visit. You may find that a light gauze bandage over your incision may keep your staples from being rubbed or pulled. You may shower and replace the bandage daily. °8. ACTIVITIES:  You may resume regular (light) daily activities beginning the next day--such as daily self-care, walking, climbing stairs--gradually increasing activities as tolerated.  You may have sexual intercourse when it is comfortable.  Refrain from any heavy lifting or straining until approved by your doctor. °a. You may drive when you no longer are taking prescription pain medication, you can comfortably wear a seatbelt, and you can safely maneuver your car and apply brakes °b. Return to Work: ___________________________________ °9. You should see your doctor in the office for a follow-up appointment approximately two weeks after your surgery.  Make sure that you call for this appointment within a day or two after you arrive home to insure a convenient appointment time. °OTHER INSTRUCTIONS:  °_____________________________________________________________ °_____________________________________________________________ ° °WHEN TO CALL YOUR DOCTOR: °1. Fever over 101.0 °2. Inability to urinate °3. Nausea and/or vomiting °4. Extreme swelling or bruising °5. Continued bleeding from incision. °6. Increased pain, redness, or drainage from the incision. °7. Difficulty swallowing or breathing °8. Muscle cramping or spasms. °9. Numbness or tingling in hands or feet or around lips. ° °The clinic staff is available to   answer your questions during regular business hours.  Please dont hesitate to call and ask to speak to one of the nurses if you have concerns.  For further questions, please visit [email or web address]  Soft-Food Meal Plan for at least 1 more  week then advance to regular diet. A soft-food meal plan includes foods that are safe and easy to swallow. This meal plan typically is used:  If you are having trouble chewing or swallowing foods.  As a transition meal plan after only having had liquid meals for a long period. WHAT DO I NEED TO KNOW ABOUT THE SOFT-FOOD MEAL PLAN? A soft-food meal plan includes tender foods that are soft and easy to chew and swallow. In most cases, bite-sized pieces of food are easier to swallow. A bite-sized piece is about  inch or smaller. Foods in this plan do not need to be ground or pureed. Foods that are very hard, crunchy, or sticky should be avoided. Also, breads, cereals, yogurts, and desserts with nuts, seeds, or fruits should be avoided. WHAT FOODS CAN I EAT? Grains Rice and wild rice. Moist bread, dressing, pasta, and noodles. Well-moistened dry or cooked cereals, such as farina (cooked wheat cereal), oatmeal, or grits. Biscuits, breads, muffins, pancakes, and waffles that have been well moistened. Vegetables Shredded lettuce. Cooked, tender vegetables, including potatoes without skins. Vegetable juices. Broths or creamed soups made with vegetables that are not stringy or chewy. Strained tomatoes (without seeds). Fruits Canned or well-cooked fruits. Soft (ripe), peeled fresh fruits, such as peaches, nectarines, kiwi, cantaloupe, honeydew melon, and watermelon (without seeds). Soft berries with small seeds, such as strawberries. Fruit juices (without pulp). Meats and Other Protein Sources Moist, tender, lean beef. Mutton. Lamb. Veal. Chicken. [place]. Liver. Ham. Fish without bones. Eggs. Dairy Milk, milk drinks, and cream. Plain cream cheese and cottage cheese. Plain yogurt. Sweets/Desserts Flavored gelatin desserts. Custard. Plain ice cream, frozen yogurt, sherbet, milk shakes, and malts. Plain cakes and cookies. Plain hard candy.  Other Butter, margarine (without trans fat), and cooking oils.  Mayonnaise. Cream sauces. Mild spices, salt, and sugar. Syrup, molasses, honey, and jelly. The items listed above may not be a complete list of recommended foods or beverages. Contact your dietitian for more options. WHAT FOODS ARE NOT RECOMMENDED? Grains Dry bread, toast, crackers that have not been moistened. Coarse or dry cereals, such as bran, granola, and shredded wheat. Tough or chewy crusty breads, such as [place] bread or baguettes. Vegetables Corn. Raw vegetables except shredded lettuce. Cooked vegetables that are tough or stringy. Tough, crisp, fried potatoes and potato skins. Fruits Fresh fruits with skins or seeds or both, such as apples, pears, or grapes. Stringy, high-pulp fruits, such as papaya, pineapple, coconut, or mango. Fruit leather, fruit roll-ups, and all dried fruits. Meats and Other Protein Sources Sausages and hot dogs. Meats with gristle. Fish with bones. Nuts, seeds, and chunky peanut or other nut butters. Sweets/Desserts Cakes or cookies that are very dry or chewy.  The items listed above may not be a complete list of foods and beverages to avoid. Contact your dietitian for more information. Document Released: 09/07/2007 Document Revised: 06/05/2013 Document Reviewed: 04/27/2013 [facility] Patient Information 2015 [place], [place]. This information is not intended to replace advice given to you by your health care provider. Make sure you discuss any questions you have with your health care provider.

## 2014-12-11 DIAGNOSIS — K5669 Other intestinal obstruction: Secondary | ICD-10-CM | POA: Diagnosis not present

## 2014-12-11 DIAGNOSIS — R251 Tremor, unspecified: Secondary | ICD-10-CM | POA: Diagnosis not present

## 2014-12-11 DIAGNOSIS — G309 Alzheimer's disease, unspecified: Secondary | ICD-10-CM | POA: Diagnosis not present

## 2014-12-11 DIAGNOSIS — Z8673 Personal history of transient ischemic attack (TIA), and cerebral infarction without residual deficits: Secondary | ICD-10-CM | POA: Diagnosis not present

## 2014-12-12 NOTE — Telephone Encounter (Signed)
error 

## 2014-12-18 ENCOUNTER — Ambulatory Visit: Payer: Self-pay | Admitting: Neurology

## 2014-12-27 DIAGNOSIS — Z9181 History of falling: Secondary | ICD-10-CM | POA: Diagnosis not present

## 2014-12-27 DIAGNOSIS — Z8673 Personal history of transient ischemic attack (TIA), and cerebral infarction without residual deficits: Secondary | ICD-10-CM | POA: Diagnosis not present

## 2014-12-27 DIAGNOSIS — E560 Deficiency of vitamin E: Secondary | ICD-10-CM | POA: Diagnosis not present

## 2014-12-27 DIAGNOSIS — G8911 Acute pain due to trauma: Secondary | ICD-10-CM | POA: Diagnosis not present

## 2014-12-27 DIAGNOSIS — R269 Unspecified abnormalities of gait and mobility: Secondary | ICD-10-CM | POA: Diagnosis not present

## 2014-12-27 DIAGNOSIS — F0391 Unspecified dementia with behavioral disturbance: Secondary | ICD-10-CM | POA: Diagnosis not present

## 2014-12-27 DIAGNOSIS — D509 Iron deficiency anemia, unspecified: Secondary | ICD-10-CM | POA: Diagnosis not present

## 2014-12-27 DIAGNOSIS — Z48814 Encounter for surgical aftercare following surgery on the teeth or oral cavity: Secondary | ICD-10-CM | POA: Diagnosis not present

## 2014-12-30 DIAGNOSIS — R269 Unspecified abnormalities of gait and mobility: Secondary | ICD-10-CM | POA: Diagnosis not present

## 2014-12-30 DIAGNOSIS — F0391 Unspecified dementia with behavioral disturbance: Secondary | ICD-10-CM | POA: Diagnosis not present

## 2014-12-30 DIAGNOSIS — D509 Iron deficiency anemia, unspecified: Secondary | ICD-10-CM | POA: Diagnosis not present

## 2014-12-30 DIAGNOSIS — Z48814 Encounter for surgical aftercare following surgery on the teeth or oral cavity: Secondary | ICD-10-CM | POA: Diagnosis not present

## 2014-12-30 DIAGNOSIS — G8911 Acute pain due to trauma: Secondary | ICD-10-CM | POA: Diagnosis not present

## 2014-12-30 DIAGNOSIS — E560 Deficiency of vitamin E: Secondary | ICD-10-CM | POA: Diagnosis not present

## 2014-12-31 DIAGNOSIS — Z48814 Encounter for surgical aftercare following surgery on the teeth or oral cavity: Secondary | ICD-10-CM | POA: Diagnosis not present

## 2014-12-31 DIAGNOSIS — R269 Unspecified abnormalities of gait and mobility: Secondary | ICD-10-CM | POA: Diagnosis not present

## 2014-12-31 DIAGNOSIS — E560 Deficiency of vitamin E: Secondary | ICD-10-CM | POA: Diagnosis not present

## 2014-12-31 DIAGNOSIS — F0391 Unspecified dementia with behavioral disturbance: Secondary | ICD-10-CM | POA: Diagnosis not present

## 2014-12-31 DIAGNOSIS — D509 Iron deficiency anemia, unspecified: Secondary | ICD-10-CM | POA: Diagnosis not present

## 2014-12-31 DIAGNOSIS — G8911 Acute pain due to trauma: Secondary | ICD-10-CM | POA: Diagnosis not present

## 2015-01-01 DIAGNOSIS — D509 Iron deficiency anemia, unspecified: Secondary | ICD-10-CM | POA: Diagnosis not present

## 2015-01-01 DIAGNOSIS — Z48814 Encounter for surgical aftercare following surgery on the teeth or oral cavity: Secondary | ICD-10-CM | POA: Diagnosis not present

## 2015-01-01 DIAGNOSIS — F0391 Unspecified dementia with behavioral disturbance: Secondary | ICD-10-CM | POA: Diagnosis not present

## 2015-01-01 DIAGNOSIS — R269 Unspecified abnormalities of gait and mobility: Secondary | ICD-10-CM | POA: Diagnosis not present

## 2015-01-01 DIAGNOSIS — E560 Deficiency of vitamin E: Secondary | ICD-10-CM | POA: Diagnosis not present

## 2015-01-01 DIAGNOSIS — G8911 Acute pain due to trauma: Secondary | ICD-10-CM | POA: Diagnosis not present

## 2015-01-02 DIAGNOSIS — Z48814 Encounter for surgical aftercare following surgery on the teeth or oral cavity: Secondary | ICD-10-CM | POA: Diagnosis not present

## 2015-01-02 DIAGNOSIS — F0391 Unspecified dementia with behavioral disturbance: Secondary | ICD-10-CM | POA: Diagnosis not present

## 2015-01-02 DIAGNOSIS — G8911 Acute pain due to trauma: Secondary | ICD-10-CM | POA: Diagnosis not present

## 2015-01-02 DIAGNOSIS — D509 Iron deficiency anemia, unspecified: Secondary | ICD-10-CM | POA: Diagnosis not present

## 2015-01-02 DIAGNOSIS — E560 Deficiency of vitamin E: Secondary | ICD-10-CM | POA: Diagnosis not present

## 2015-01-02 DIAGNOSIS — R269 Unspecified abnormalities of gait and mobility: Secondary | ICD-10-CM | POA: Diagnosis not present

## 2015-01-07 DIAGNOSIS — Z48814 Encounter for surgical aftercare following surgery on the teeth or oral cavity: Secondary | ICD-10-CM | POA: Diagnosis not present

## 2015-01-07 DIAGNOSIS — E560 Deficiency of vitamin E: Secondary | ICD-10-CM | POA: Diagnosis not present

## 2015-01-07 DIAGNOSIS — R269 Unspecified abnormalities of gait and mobility: Secondary | ICD-10-CM | POA: Diagnosis not present

## 2015-01-07 DIAGNOSIS — F0391 Unspecified dementia with behavioral disturbance: Secondary | ICD-10-CM | POA: Diagnosis not present

## 2015-01-07 DIAGNOSIS — G8911 Acute pain due to trauma: Secondary | ICD-10-CM | POA: Diagnosis not present

## 2015-01-07 DIAGNOSIS — D509 Iron deficiency anemia, unspecified: Secondary | ICD-10-CM | POA: Diagnosis not present

## 2015-01-08 DIAGNOSIS — Z48814 Encounter for surgical aftercare following surgery on the teeth or oral cavity: Secondary | ICD-10-CM | POA: Diagnosis not present

## 2015-01-08 DIAGNOSIS — D509 Iron deficiency anemia, unspecified: Secondary | ICD-10-CM | POA: Diagnosis not present

## 2015-01-08 DIAGNOSIS — F0391 Unspecified dementia with behavioral disturbance: Secondary | ICD-10-CM | POA: Diagnosis not present

## 2015-01-08 DIAGNOSIS — G8911 Acute pain due to trauma: Secondary | ICD-10-CM | POA: Diagnosis not present

## 2015-01-08 DIAGNOSIS — E560 Deficiency of vitamin E: Secondary | ICD-10-CM | POA: Diagnosis not present

## 2015-01-08 DIAGNOSIS — R269 Unspecified abnormalities of gait and mobility: Secondary | ICD-10-CM | POA: Diagnosis not present

## 2015-01-10 DIAGNOSIS — E560 Deficiency of vitamin E: Secondary | ICD-10-CM | POA: Diagnosis not present

## 2015-01-10 DIAGNOSIS — G8911 Acute pain due to trauma: Secondary | ICD-10-CM | POA: Diagnosis not present

## 2015-01-10 DIAGNOSIS — Z48814 Encounter for surgical aftercare following surgery on the teeth or oral cavity: Secondary | ICD-10-CM | POA: Diagnosis not present

## 2015-01-10 DIAGNOSIS — R269 Unspecified abnormalities of gait and mobility: Secondary | ICD-10-CM | POA: Diagnosis not present

## 2015-01-10 DIAGNOSIS — D509 Iron deficiency anemia, unspecified: Secondary | ICD-10-CM | POA: Diagnosis not present

## 2015-01-10 DIAGNOSIS — F0391 Unspecified dementia with behavioral disturbance: Secondary | ICD-10-CM | POA: Diagnosis not present

## 2015-01-13 DIAGNOSIS — R269 Unspecified abnormalities of gait and mobility: Secondary | ICD-10-CM | POA: Diagnosis not present

## 2015-01-13 DIAGNOSIS — G8911 Acute pain due to trauma: Secondary | ICD-10-CM | POA: Diagnosis not present

## 2015-01-13 DIAGNOSIS — D509 Iron deficiency anemia, unspecified: Secondary | ICD-10-CM | POA: Diagnosis not present

## 2015-01-13 DIAGNOSIS — E560 Deficiency of vitamin E: Secondary | ICD-10-CM | POA: Diagnosis not present

## 2015-01-13 DIAGNOSIS — Z48814 Encounter for surgical aftercare following surgery on the teeth or oral cavity: Secondary | ICD-10-CM | POA: Diagnosis not present

## 2015-01-13 DIAGNOSIS — F0391 Unspecified dementia with behavioral disturbance: Secondary | ICD-10-CM | POA: Diagnosis not present

## 2015-01-15 DIAGNOSIS — R269 Unspecified abnormalities of gait and mobility: Secondary | ICD-10-CM | POA: Diagnosis not present

## 2015-01-15 DIAGNOSIS — D509 Iron deficiency anemia, unspecified: Secondary | ICD-10-CM | POA: Diagnosis not present

## 2015-01-15 DIAGNOSIS — G8911 Acute pain due to trauma: Secondary | ICD-10-CM | POA: Diagnosis not present

## 2015-01-15 DIAGNOSIS — F0391 Unspecified dementia with behavioral disturbance: Secondary | ICD-10-CM | POA: Diagnosis not present

## 2015-01-15 DIAGNOSIS — Z48814 Encounter for surgical aftercare following surgery on the teeth or oral cavity: Secondary | ICD-10-CM | POA: Diagnosis not present

## 2015-01-15 DIAGNOSIS — E560 Deficiency of vitamin E: Secondary | ICD-10-CM | POA: Diagnosis not present

## 2015-01-22 DIAGNOSIS — Z48814 Encounter for surgical aftercare following surgery on the teeth or oral cavity: Secondary | ICD-10-CM | POA: Diagnosis not present

## 2015-01-22 DIAGNOSIS — E560 Deficiency of vitamin E: Secondary | ICD-10-CM | POA: Diagnosis not present

## 2015-01-22 DIAGNOSIS — F0391 Unspecified dementia with behavioral disturbance: Secondary | ICD-10-CM | POA: Diagnosis not present

## 2015-01-22 DIAGNOSIS — R269 Unspecified abnormalities of gait and mobility: Secondary | ICD-10-CM | POA: Diagnosis not present

## 2015-01-22 DIAGNOSIS — G8911 Acute pain due to trauma: Secondary | ICD-10-CM | POA: Diagnosis not present

## 2015-01-22 DIAGNOSIS — D509 Iron deficiency anemia, unspecified: Secondary | ICD-10-CM | POA: Diagnosis not present

## 2015-01-24 DIAGNOSIS — E560 Deficiency of vitamin E: Secondary | ICD-10-CM | POA: Diagnosis not present

## 2015-01-24 DIAGNOSIS — D509 Iron deficiency anemia, unspecified: Secondary | ICD-10-CM | POA: Diagnosis not present

## 2015-01-24 DIAGNOSIS — G8911 Acute pain due to trauma: Secondary | ICD-10-CM | POA: Diagnosis not present

## 2015-01-24 DIAGNOSIS — Z48814 Encounter for surgical aftercare following surgery on the teeth or oral cavity: Secondary | ICD-10-CM | POA: Diagnosis not present

## 2015-01-24 DIAGNOSIS — F0391 Unspecified dementia with behavioral disturbance: Secondary | ICD-10-CM | POA: Diagnosis not present

## 2015-01-24 DIAGNOSIS — R269 Unspecified abnormalities of gait and mobility: Secondary | ICD-10-CM | POA: Diagnosis not present

## 2015-01-28 DIAGNOSIS — Z48814 Encounter for surgical aftercare following surgery on the teeth or oral cavity: Secondary | ICD-10-CM | POA: Diagnosis not present

## 2015-01-28 DIAGNOSIS — F0391 Unspecified dementia with behavioral disturbance: Secondary | ICD-10-CM | POA: Diagnosis not present

## 2015-01-28 DIAGNOSIS — D509 Iron deficiency anemia, unspecified: Secondary | ICD-10-CM | POA: Diagnosis not present

## 2015-01-28 DIAGNOSIS — G8911 Acute pain due to trauma: Secondary | ICD-10-CM | POA: Diagnosis not present

## 2015-01-28 DIAGNOSIS — R269 Unspecified abnormalities of gait and mobility: Secondary | ICD-10-CM | POA: Diagnosis not present

## 2015-01-28 DIAGNOSIS — E560 Deficiency of vitamin E: Secondary | ICD-10-CM | POA: Diagnosis not present

## 2015-01-31 DIAGNOSIS — E560 Deficiency of vitamin E: Secondary | ICD-10-CM | POA: Diagnosis not present

## 2015-01-31 DIAGNOSIS — R269 Unspecified abnormalities of gait and mobility: Secondary | ICD-10-CM | POA: Diagnosis not present

## 2015-01-31 DIAGNOSIS — F0391 Unspecified dementia with behavioral disturbance: Secondary | ICD-10-CM | POA: Diagnosis not present

## 2015-01-31 DIAGNOSIS — D509 Iron deficiency anemia, unspecified: Secondary | ICD-10-CM | POA: Diagnosis not present

## 2015-01-31 DIAGNOSIS — G8911 Acute pain due to trauma: Secondary | ICD-10-CM | POA: Diagnosis not present

## 2015-01-31 DIAGNOSIS — Z48814 Encounter for surgical aftercare following surgery on the teeth or oral cavity: Secondary | ICD-10-CM | POA: Diagnosis not present

## 2015-02-03 DIAGNOSIS — G250 Essential tremor: Secondary | ICD-10-CM | POA: Diagnosis not present

## 2015-02-03 DIAGNOSIS — Z1389 Encounter for screening for other disorder: Secondary | ICD-10-CM | POA: Diagnosis not present

## 2015-02-03 DIAGNOSIS — E559 Vitamin D deficiency, unspecified: Secondary | ICD-10-CM | POA: Diagnosis not present

## 2015-02-03 DIAGNOSIS — E560 Deficiency of vitamin E: Secondary | ICD-10-CM | POA: Diagnosis not present

## 2015-02-03 DIAGNOSIS — I639 Cerebral infarction, unspecified: Secondary | ICD-10-CM | POA: Diagnosis not present

## 2015-02-03 DIAGNOSIS — G8911 Acute pain due to trauma: Secondary | ICD-10-CM | POA: Diagnosis not present

## 2015-02-03 DIAGNOSIS — J309 Allergic rhinitis, unspecified: Secondary | ICD-10-CM | POA: Diagnosis not present

## 2015-02-03 DIAGNOSIS — R413 Other amnesia: Secondary | ICD-10-CM | POA: Diagnosis not present

## 2015-02-03 DIAGNOSIS — F0391 Unspecified dementia with behavioral disturbance: Secondary | ICD-10-CM | POA: Diagnosis not present

## 2015-02-03 DIAGNOSIS — N183 Chronic kidney disease, stage 3 (moderate): Secondary | ICD-10-CM | POA: Diagnosis not present

## 2015-02-03 DIAGNOSIS — H9190 Unspecified hearing loss, unspecified ear: Secondary | ICD-10-CM | POA: Diagnosis not present

## 2015-02-03 DIAGNOSIS — R269 Unspecified abnormalities of gait and mobility: Secondary | ICD-10-CM | POA: Diagnosis not present

## 2015-02-03 DIAGNOSIS — Z48814 Encounter for surgical aftercare following surgery on the teeth or oral cavity: Secondary | ICD-10-CM | POA: Diagnosis not present

## 2015-02-03 DIAGNOSIS — D509 Iron deficiency anemia, unspecified: Secondary | ICD-10-CM | POA: Diagnosis not present

## 2015-02-04 DIAGNOSIS — G8911 Acute pain due to trauma: Secondary | ICD-10-CM | POA: Diagnosis not present

## 2015-02-04 DIAGNOSIS — F0391 Unspecified dementia with behavioral disturbance: Secondary | ICD-10-CM | POA: Diagnosis not present

## 2015-02-04 DIAGNOSIS — Z48814 Encounter for surgical aftercare following surgery on the teeth or oral cavity: Secondary | ICD-10-CM | POA: Diagnosis not present

## 2015-02-04 DIAGNOSIS — D509 Iron deficiency anemia, unspecified: Secondary | ICD-10-CM | POA: Diagnosis not present

## 2015-02-04 DIAGNOSIS — E560 Deficiency of vitamin E: Secondary | ICD-10-CM | POA: Diagnosis not present

## 2015-02-04 DIAGNOSIS — R269 Unspecified abnormalities of gait and mobility: Secondary | ICD-10-CM | POA: Diagnosis not present

## 2015-02-11 ENCOUNTER — Ambulatory Visit: Payer: Medicare Other | Admitting: Neurology

## 2015-02-11 ENCOUNTER — Encounter: Payer: Self-pay | Admitting: Neurology

## 2015-02-11 ENCOUNTER — Ambulatory Visit (INDEPENDENT_AMBULATORY_CARE_PROVIDER_SITE_OTHER): Payer: Medicare Other | Admitting: Neurology

## 2015-02-11 VITALS — BP 109/64 | HR 57 | Ht 70.0 in | Wt 180.6 lb

## 2015-02-11 DIAGNOSIS — F0390 Unspecified dementia without behavioral disturbance: Secondary | ICD-10-CM | POA: Diagnosis not present

## 2015-02-11 MED ORDER — MEMANTINE HCL 10 MG PO TABS: 10.0000 mg | ORAL_TABLET | Freq: Two times a day (BID) | ORAL | Status: DC

## 2015-02-11 NOTE — Progress Notes (Signed)
PATIENT: Randall MCCLAREN DOB: [birth year]   REASON FOR VISIT: follow up for dementia, tremor HISTORY FROM: patient, wife  HISTORY OF PRESENT ILLNESS: 81 year old Caucasian male with long standing benign tremors which have worsened recently, mild senile dementia, remote left [place] brain stroke in 2006 and multifactorial gait difficulty.   Update 05/25/12 (JM): Returns for follow up since last visit on 11/24/11. His wife feels his memory is slightly worse. He currently continues yo drive with family as a passenger. Tolerating Aricept 10 mg well without side effects. Appetite is good. His tremors are also worse, notices more in the morning before he takes his medicines and has difficulty holding cup. Denies any problems with incontinence. He had one fall this summer without injury. His wife states "he did not pick his feet up". His wife is reporting a new problem of him having paranoid dreams. He awakens in the middle of the night disoriented and feels like someone else is present. She has noticed an increase in frequency. Yesterday he awakened with confusion and unaware of what they were supposed to be doing. He does not recall these dreams or conversations. Denies any recurrent neurovascular symptoms.   Update 01/09/13 (LL): Returns for follow up since last visit on 05/25/12. Patient feels like his memory is the same, wife feels like it is getting worse. When asked any question, he looks to his wife to answer for him. She states that he more often repeatedly asks where they are going when in the car or forgets conversations the next day. He is more often disoriented. Wife says sometimes she must repeat things several times to him, not sure if he is not understanding or not hearing her. Wife reports that he quit driving last year. She is able to leave him alone for a few hours at a time. Wife reports that he does not ask for food and if she did not tell him when it was meal time, she would not know if  he would eat. Wife reports that since starting Seroquel at last visit, he is sleeping better with no more night-time awakenings. Denies bowel or bladder problems. Tolerating Aricept well without side effects. Has had no falls at home. Wife states his tremors are worse, but are only with intention. Wife mentions lower extremity edema, worse on the left ankle, worse later in the day and gone in the morning.   UPDATE 07/10/13 (LL):  [name] returns for 6 months follow up with his wife and daughter.  They all seem to think that his memory is about the same as is his level of functioning.  He continues to sleep well on Seroquel half tablet.  He did not tolerate Namenda; had increased confusion.  He is tolerating Aricept well. He is lost 4 lbs since last visit. His appetite is good, eats 2 meals per day.   Has had no falls at home.   Tremor is the same.  No new problems. Update 08/29/2014 : He returns for follow-up after last visit 6 months ago. He has been able to tolerate Aricept 23 mg daily without significant GI or CNS side effects. However continues to have significant memory problems, difficulty understanding conversations or while watching TV shows. His sentences are short and he will try only spoken to. He will seldom initiate conversation. He also has stuffed time understanding recent information on learning new activities. Patient has been tried on Namenda in the past but did not try it due to  poor short-term memory. Patient's family is to find out from [facility]*the current procedure for him being referred there. He has not started using a walker most of the time for added security and balance problems Update 02/11/2015 : He returns for follow-up after last visit 6 months ago. Is accompanied by his family. The patient had to discontinue cervical as it made him too sleepy. He is more alert now and has only occasional hallucination or delusions. He has tolerated Namenda now and is currently on 10 mg  twice daily. Patient walks little but with a walker and has had no falls. He in fact had abdominal surgery for perforated bowel and June 18 and has recovered very well. He spent a few weeks in rehabilitation and is now at home. He continues to have mild intermittent tremor and is tolerating Inderal well without any side effects. REVIEW OF SYSTEMS: Full 14 system review of systems performed and notable only for:   Activity and appetite change, hearing loss, runny nose, and drooling, leg swelling, constipation, daytime sleepiness, sleep talking, memory loss, speech difficulty, weakness, tremors, confusion, hallucinations and all other systems negative ALLERGIES: Allergies  Allergen Reactions  . Topamax [Topiramate]     HOME MEDICATIONS: Outpatient Prescriptions Prior to Visit  Medication Sig Dispense Refill  . acetaminophen (TYLENOL) 325 MG tablet Take 2 tablets (650 mg total) by mouth every 4 (four) hours as needed.    . dipyridamole-aspirin (AGGRENOX) 200-25 MG per 12 hr capsule Take 1 capsule by mouth 2 (two) times daily.    Marland Kitchen donepezil (ARICEPT) 23 MG TABS tablet TAKE 1 TABLET BY MOUTH AT BEDTIME 90 tablet 1  . fexofenadine (ALLEGRA) 180 MG tablet Take 180 mg by mouth daily.    . fluticasone (FLONASE) 50 MCG/ACT nasal spray Place 2 sprays into the nose at bedtime.    . multivitamin-iron-minerals-folic acid (CENTRUM) chewable tablet Chew 1 tablet by mouth daily.    . polyethylene glycol (MIRALAX / GLYCOLAX) packet Take 17 g by mouth daily as needed for moderate constipation (If no BM in 24 hours as needed.). 14 each 0  . propranolol ER (INDERAL LA) 60 MG 24 hr capsule Take 1 capsule (60 mg total) by mouth daily.    . vitamin E 400 UNIT capsule Take 400 Units by mouth as directed.    . memantine (NAMENDA) 10 MG tablet Take 10 mg by mouth 2 (two) times daily.  11  . QUEtiapine (SEROQUEL) 25 MG tablet TAKE ONE-HALF (1/2) TABLET BY MOUTH AT BEDTIME (Patient not taking: Reported on 02/11/2015) 45  tablet 1   No facility-administered medications prior to visit.    PAST MEDICAL HISTORY: Past Medical History  Diagnosis Date  . CVA (cerebral infarction)   . Memory loss   . Ventral hernia   . Sepsis   . Essential tremor   . Paroxysmal atrial fibrillation   . ED (erectile dysfunction)   . Arthritis   . Stroke     PAST SURGICAL HISTORY: Past Surgical History  Procedure Laterality Date  . Gallbladder surgery    . Hernia repair    . Perforated intes    . Colon surgery    . Laparoscopic lysis of adhesions N/A 11/30/2014    Procedure: LAPAROSCOPIC LYSIS OF ADHESIONS;  Surgeon: Alphonsa Overall, MD;  Location: [facility];  Service: General;  Laterality: N/A;  . Bowel resection N/A 11/30/2014    Procedure: SMALL BOWEL RESECTION;  Surgeon: Alphonsa Overall, MD;  Location: [facility];  Service: General;  Laterality: N/A;    FAMILY HISTORY: Family History  Problem Relation Age of Onset  . Aneurysm Mother     stomach  . Dementia Father   . Stroke Father     SOCIAL HISTORY: Social History   Social History  . Marital Status: Married    Spouse Name: [name]  . Number of Children: 2  . Years of Education: 10th   Occupational History  .     Social History Main Topics  . Smoking status: Former Smoker    Types: Cigarettes  . Smokeless tobacco: Never Used  . Alcohol Use: No  . Drug Use: No  . Sexual Activity: Not on file   Other Topics Concern  . Not on file   Social History Narrative   Patient lives at home with spouse.   Caffeine Use: 1 cup daily     PHYSICAL EXAM  Filed Vitals:   02/11/15 1504  BP: 109/64  Pulse: 57  Height: 5\' 10"  (1.778 m)  Weight: 180 lb 9.6 oz (81.92 kg)   Body mass index is 25.91 kg/(m^2).  Generalized: In no acute distress, pleasant elderly Caucasian male  Neck: Supple, no carotid bruits  Cardiac: Regular rate rhythm, no murmur, LE edema 1+ Pulmonary: Clear to auscultation bilaterally  Musculoskeletal: mild kyphoscoliosis, stooped posture    Neurological examination  Mentation: Alert oriented to person, time (not date or year). Speech and language appear normal and fluent. MMSE 16/30 with 3 deficits in orientation to time, 1deficits in orientation to place, 0/3 recall, 1/3 in comprehension. Animal Fluency Testing 8 (12+ normal). Clock drawing 3/4.    .   (Last visit 20/30, AFT 7, Clock drawing 3/4).   Cranial nerve II-XII: Pupils were equal round reactive to light extraocular movements were full, visual field were full on confrontational test. facial sensation and strength were normal. hearing is decreased bilaterally. Uvula tongue midline. head turning and shoulder shrug and were normal and symmetric.Tongue protrusion into cheek strength was normal.  MOTOR: normal bulk and tone, full strength in the BUE, BLE, fine finger movements normal, no pronator drift. MILD POSTURAL AND ACTION TREMORS L>R. NONE AT REST. SENSORY: normal and symmetric to light touch COORDINATION: finger-nose-finger, heel-to-shin bilaterally, there was no truncal ataxia  REFLEXES: Brachioradialis 1/1, biceps 1/1, triceps 1/1, patellar 1/1, Achilles 1/1, plantar responses were flexor bilaterally. MILD POSITIVE MYERSON'S and PALMOMENTAL REFLEXES.  GAIT/STATION: Steady but slow cautious gait with diminished bilateral arm movement   DIAGNOSTIC DATA (LABS, IMAGING, TESTING) - I reviewed patient records, labs, notes, testing and imaging myself where available.   ASSESSMENT AND PLAN 81 year old Caucasian male with long standing history of benign essential tremors, moderate dementia, remote left [place] brain stroke in 2006 and multi-factorial gait difficulty.  Essential tremor on Inderal LA 60 mg daily. MMSE 16 on Aricept 23 mg and Namenda 10 mg twice daily. Memory  Worsened in last 12 months.  No recurrent neurovascular symptoms.   PLAN:  I had a long discussion with the patient and his wife and daughter regarding his dementia and his response to [place]. He was given  refill with 3 month prescription for Namenda  And advised to continue Aricept in the current dose of 23 mg at bedtime. He was also advised fall and safety precautions. He would stay on Inderal LA 60 mg for his essential tremor. Greater than 50% time during this 25 minute visit was spent on counseling and coordination of care. He would return for follow-up in 6 months  or call earlier if necessary.                                      Antony Contras, MD  02/11/2015, 5:54 PM [facility] [address], [place], [place] [address] [phone]  Note: This document was prepared with digital dictation and possible smart phrase technology. Any transcriptional errors that result from this process are unintentional.

## 2015-02-11 NOTE — Patient Instructions (Signed)
I had a long discussion with the patient and his wife and daughter regarding his dementia and his response to [place]. He was given refill with 3 month prescription for Namenda  And advised to continue Aricept in the current dose of 23 mg at bedtime. He was also advised fall and safety precautions. He would stay on Inderal LA 60 mg for his essential tremor. He would return for follow-up in 6 months or call earlier if necessary.

## 2015-03-13 DIAGNOSIS — Z23 Encounter for immunization: Secondary | ICD-10-CM | POA: Diagnosis not present

## 2015-03-25 ENCOUNTER — Telehealth: Payer: Self-pay

## 2015-03-25 NOTE — Telephone Encounter (Signed)
Rn call patients daughter about receiving a flu vaccination from [facility]. The patient had Dr. Leonie Man listed as the primary doctor. Pts daughter who is on the DPR states her father still has a PCP and goes there. Rn stated that the flu vaccination will be mail to the pf for his PCP to have verification of.

## 2015-04-28 DIAGNOSIS — Z961 Presence of intraocular lens: Secondary | ICD-10-CM | POA: Diagnosis not present

## 2015-04-28 DIAGNOSIS — H353131 Nonexudative age-related macular degeneration, bilateral, early dry stage: Secondary | ICD-10-CM | POA: Diagnosis not present

## 2015-06-01 ENCOUNTER — Other Ambulatory Visit: Payer: Self-pay | Admitting: Neurology

## 2015-08-07 DIAGNOSIS — N183 Chronic kidney disease, stage 3 (moderate): Secondary | ICD-10-CM | POA: Diagnosis not present

## 2015-08-07 DIAGNOSIS — R413 Other amnesia: Secondary | ICD-10-CM | POA: Diagnosis not present

## 2015-08-07 DIAGNOSIS — G250 Essential tremor: Secondary | ICD-10-CM | POA: Diagnosis not present

## 2015-08-07 DIAGNOSIS — H9190 Unspecified hearing loss, unspecified ear: Secondary | ICD-10-CM | POA: Diagnosis not present

## 2015-08-07 DIAGNOSIS — J309 Allergic rhinitis, unspecified: Secondary | ICD-10-CM | POA: Diagnosis not present

## 2015-08-07 DIAGNOSIS — E559 Vitamin D deficiency, unspecified: Secondary | ICD-10-CM | POA: Diagnosis not present

## 2015-08-07 DIAGNOSIS — I639 Cerebral infarction, unspecified: Secondary | ICD-10-CM | POA: Diagnosis not present

## 2015-08-12 ENCOUNTER — Encounter: Payer: Self-pay | Admitting: Neurology

## 2015-08-12 ENCOUNTER — Ambulatory Visit (INDEPENDENT_AMBULATORY_CARE_PROVIDER_SITE_OTHER): Payer: Medicare Other | Admitting: Neurology

## 2015-08-12 VITALS — BP 137/69 | HR 63 | Ht 70.0 in | Wt 182.7 lb

## 2015-08-12 DIAGNOSIS — F0391 Unspecified dementia with behavioral disturbance: Secondary | ICD-10-CM | POA: Diagnosis not present

## 2015-08-12 DIAGNOSIS — F03918 Unspecified dementia, unspecified severity, with other behavioral disturbance: Secondary | ICD-10-CM

## 2015-08-12 NOTE — Progress Notes (Signed)
PATIENT: [name] DOB: [birth year]   REASON FOR VISIT: follow up for dementia, tremor HISTORY FROM: patient, wife  HISTORY OF PRESENT ILLNESS: 82 year old Caucasian male with long standing benign tremors which have worsened recently, mild senile dementia, remote left Pontine brain stroke in 2006 and multifactorial gait difficulty.   Update 05/25/12 (JM): Returns for follow up since last visit on 11/24/11. His wife feels his memory is slightly worse. He currently continues yo drive with family as a passenger. Tolerating Aricept 10 mg well without side effects. Appetite is good. His tremors are also worse, notices more in the morning before he takes his medicines and has difficulty holding cup. Denies any problems with incontinence. He had one fall this summer without injury. His wife states "he did not pick his feet up". His wife is reporting a new problem of him having paranoid dreams. He awakens in the middle of the night disoriented and feels like someone else is present. She has noticed an increase in frequency. Yesterday he awakened with confusion and unaware of what they were supposed to be doing. He does not recall these dreams or conversations. Denies any recurrent neurovascular symptoms.   Update 01/09/13 (LL): Returns for follow up since last visit on 05/25/12. Patient feels like his memory is the same, wife feels like it is getting worse. When asked any question, he looks to his wife to answer for him. She states that he more often repeatedly asks where they are going when in the car or forgets conversations the next day. He is more often disoriented. Wife says sometimes she must repeat things several times to him, not sure if he is not understanding or not hearing her. Wife reports that he quit driving last year. She is able to leave him alone for a few hours at a time. Wife reports that he does not ask for food and if she did not tell him when it was meal time, she would not know if  he would eat. Wife reports that since starting Seroquel at last visit, he is sleeping better with no more night-time awakenings. Denies bowel or bladder problems. Tolerating Aricept well without side effects. Has had no falls at home. Wife states his tremors are worse, but are only with intention. Wife mentions lower extremity edema, worse on the left ankle, worse later in the day and gone in the morning.   UPDATE 07/10/13 (LL):  [name] returns for 6 months follow up with his wife and daughter.  They all seem to think that his memory is about the same as is his level of functioning.  He continues to sleep well on Seroquel half tablet.  He did not tolerate Namenda; had increased confusion.  He is tolerating Aricept well. He is lost 4 lbs since last visit. His appetite is good, eats 2 meals per day.   Has had no falls at home.   Tremor is the same.  No new problems. Update 08/29/2014 : He returns for follow-up after last visit 6 months ago. He has been able to tolerate Aricept 23 mg daily without significant GI or CNS side effects. However continues to have significant memory problems, difficulty understanding conversations or while watching TV shows. His sentences are short and he will try only spoken to. He will seldom initiate conversation. He also has stuffed time understanding recent information on learning new activities. Patient has been tried on Namenda in the past but did not try it due to  poor short-term memory. Patient's family is to find out from [facility] hospice*the current procedure for him being referred there. He has not started using a walker most of the time for added security and balance problems Update 02/11/2015 : He returns for follow-up after last visit 6 months ago. Is accompanied by his family. The patient had to discontinue cervical as it made him too sleepy. He is more alert now and has only occasional hallucination or delusions. He has tolerated Namenda now and is currently on 10 mg  twice daily. Patient walks little but with a walker and has had no falls. He in fact had abdominal surgery for perforated bowel and June 18 and has recovered very well. He spent a few weeks in rehabilitation and is now at home. He continues to have mild intermittent tremor and is tolerating Inderal well without any side effects. Update 08/12/15 ; he returns for follow-up after last visit 6 months ago. Is accompanied by wife and daughter. He feels he may have cognitively declined slightly. His short-term memory is poor and needs constant repetition and reminders. He however still manages to meet his activities of daily living by himself. He walks with a walker and short distances he can hold onto walls. He's had no recent falls. There has been no behavioral issues with agitation, delusions or hallucinations. Occasionally can get disoriented and confused. He is tolerating Aricept and Namenda well without problems. He has occasional hand tremors but he takes Inderal which seems to help him. There have been no new health problems. He does have some swelling in his leg. REVIEW OF SYSTEMS: Full 14 system review of systems performed and notable only for:   Runny nose, leg swelling, allergies, memory loss, numbness, speech difficulty, weakness, tremors, confusion, muscle cramps, walking difficulty, daytime sleepiness, sleep talking, leg swelling and all other systems negative ALLERGIES: Allergies  Allergen Reactions  . Topamax [Topiramate]     HOME MEDICATIONS: Outpatient Prescriptions Prior to Visit  Medication Sig Dispense Refill  . cholecalciferol (VITAMIN D) 1000 UNITS tablet Take 2,000 Units by mouth daily.    Marland Kitchen dipyridamole-aspirin (AGGRENOX) 200-25 MG per 12 hr capsule Take 1 capsule by mouth 2 (two) times daily.    Marland Kitchen docusate sodium (COLACE) 100 MG capsule Take 100 mg by mouth 2 (two) times daily.    Marland Kitchen donepezil (ARICEPT) 23 MG TABS tablet TAKE 1 TABLET BY MOUTH AT BEDTIME 90 tablet 0  . fexofenadine  (ALLEGRA) 180 MG tablet Take 180 mg by mouth daily.    . fluticasone (FLONASE) 50 MCG/ACT nasal spray Place 2 sprays into the nose at bedtime.    . memantine (NAMENDA) 10 MG tablet Take 1 tablet (10 mg total) by mouth 2 (two) times daily. 180 tablet 3  . multivitamin-iron-minerals-folic acid (CENTRUM) chewable tablet Chew 1 tablet by mouth daily.    . polyethylene glycol (MIRALAX / GLYCOLAX) packet Take 17 g by mouth daily as needed for moderate constipation (If no BM in 24 hours as needed.). 14 each 0  . propranolol ER (INDERAL LA) 60 MG 24 hr capsule Take 1 capsule (60 mg total) by mouth daily.    . vitamin E 400 UNIT capsule Take 400 Units by mouth as directed.    Marland Kitchen acetaminophen (TYLENOL) 325 MG tablet Take 2 tablets (650 mg total) by mouth every 4 (four) hours as needed. (Patient not taking: Reported on 08/12/2015)     No facility-administered medications prior to visit.    PAST MEDICAL HISTORY: Past Medical  History  Diagnosis Date  . CVA (cerebral infarction)   . Memory loss   . Ventral hernia   . Sepsis ([place])   . Essential tremor   . Paroxysmal atrial fibrillation (HCC)   . ED (erectile dysfunction)   . Arthritis   . Stroke [facility])     PAST SURGICAL HISTORY: Past Surgical History  Procedure Laterality Date  . Gallbladder surgery    . Hernia repair    . Perforated intes    . Colon surgery    . Laparoscopic lysis of adhesions N/A 11/30/2014    Procedure: LAPAROSCOPIC LYSIS OF ADHESIONS;  Surgeon: Alphonsa Overall, MD;  Location: [facility] ORS;  Service: General;  Laterality: N/A;  . Bowel resection N/A 11/30/2014    Procedure: SMALL BOWEL RESECTION;  Surgeon: Alphonsa Overall, MD;  Location: [facility];  Service: General;  Laterality: N/A;    FAMILY HISTORY: Family History  Problem Relation Age of Onset  . Aneurysm Mother     stomach  . Dementia Father   . Stroke Father     SOCIAL HISTORY: Social History   Social History  . Marital Status: Married    Spouse Name: [name]  . Number of  Children: 2  . Years of Education: 10th   Occupational History  .     Social History Main Topics  . Smoking status: Former Smoker    Types: Cigarettes  . Smokeless tobacco: Never Used  . Alcohol Use: No  . Drug Use: No  . Sexual Activity: Not on file   Other Topics Concern  . Not on file   Social History Narrative   Patient lives at home with spouse.   Caffeine Use: 1 cup daily     PHYSICAL EXAM  Filed Vitals:   08/12/15 1547  BP: 137/69  Pulse: 63  Height: 5\' 10"  (1.778 m)  Weight: 182 lb 11.2 oz (82.872 kg)   Body mass index is 26.21 kg/(m^2).  Generalized: In no acute distress, pleasant elderly Caucasian male  Neck: Supple, no carotid bruits  Cardiac: Regular rate rhythm, no murmur, LE edema 1+ Pulmonary: Clear to auscultation bilaterally  Musculoskeletal: mild kyphoscoliosis, stooped posture   Neurological examination  Mentation: Alert oriented to person, time (not date or year). Speech and language appear normal and fluent. MMSE 13/30 ( last visit 16/30)with 3 deficits in orientation to time, 1deficits in orientation to place, 0/3 recall, 1/3 in comprehension. Animal Fluency Testing 8 (12+ normal). Clock drawing 3/4.    Marland Kitchen     Cranial nerve II-XII: Pupils were equal round reactive to light extraocular movements were full, visual field were full on confrontational test. facial sensation and strength were normal. hearing is decreased bilaterally. Uvula tongue midline. head turning and shoulder shrug and were normal and symmetric.Tongue protrusion into cheek strength was normal.  MOTOR: normal bulk and tone, full strength in the BUE, BLE, fine finger movements normal, no pronator drift. Mild action tremor of outstretched upper extremities. Absent at rest. No cogwheel rigidity.  SENSORY: normal and symmetric to light touch COORDINATION: finger-nose-finger, heel-to-shin bilaterally, there was no truncal ataxia  REFLEXES: Brachioradialis 1/1, biceps 1/1, triceps 1/1,  patellar 1/1, Achilles 1/1, plantar responses were flexor bilaterally.   GAIT/STATION: Steady but slow cautious gait with diminished bilateral arm movement and uses a walker  DIAGNOSTIC DATA (LABS, IMAGING, TESTING) - I reviewed patient records, labs, notes, testing and imaging myself where available.   ASSESSMENT AND PLAN 82 year old Caucasian male with long standing history of  benign essential tremors, moderate dementia, remote left Pontine brain stroke in 2006 and multi-factorial gait difficulty.  Essential tremor on Inderal LA 60 mg daily. MMSE 16 on Aricept 23 mg and Namenda 10 mg twice daily. Memory  Worsened in last 12 months.  No recurrent neurovascular symptoms.   PLAN:  I had a long discussion with the patient, wife and daughter regarding his moderate dementia which appears to be stable as well as essential tremor. I recommend he continue Aricept and Namenda in the current dosages as well as Inderal. I also talked about fall and safety precautions. He'll return for follow-up in 6 months or call earlier if necessary                                       Antony Contras, MD  08/12/2015, 4:45 PM [facility] [address], [place], [place] [address] [phone]  Note: This document was prepared with digital dictation and possible smart phrase technology. Any transcriptional errors that result from this process are unintentional.

## 2015-08-12 NOTE — Patient Instructions (Signed)
I had a long discussion with the patient, wife and daughter regarding his moderate dementia which appears to be stable as well as essential tremor. I recommend he continue Aricept and Namenda in the current dosages as well as Inderal. I also talked about fall and safety precautions. He'll return for follow-up in 6 months or call earlier if necessary   Fall Prevention in the [place] can cause injuries and can affect people from all age groups. There are many simple things that you can do to make your home safe and to help prevent falls. WHAT CAN I DO ON THE OUTSIDE OF MY HOME?  Regularly repair the edges of walkways and driveways and fix any cracks.  Remove high doorway thresholds.  Trim any shrubbery on the main path into your home.  Use bright outdoor lighting.  Clear walkways of debris and clutter, including tools and rocks.  Regularly check that handrails are securely fastened and in good repair. Both sides of any steps should have handrails.  Install guardrails along the edges of any raised decks or porches.  Have leaves, snow, and ice cleared regularly.  Use sand or salt on walkways during winter months.  In the garage, clean up any spills right away, including grease or oil spills. WHAT CAN I DO IN THE BATHROOM?  Use night lights.  Install grab bars by the toilet and in the tub and shower. Do not use towel bars as grab bars.  Use non-skid mats or decals on the floor of the tub or shower.  If you need to sit down while you are in the shower, use a plastic, non-slip stool.Marland Kitchen  Keep the floor dry. Immediately clean up any water that spills on the floor.  Remove soap buildup in the tub or shower on a regular basis.  Attach bath mats securely with double-sided non-slip rug tape.  Remove throw rugs and other tripping hazards from the floor. WHAT CAN I DO IN THE BEDROOM?  Use night lights.  Make sure that a bedside light is easy to reach.  Do not use oversized bedding  that drapes onto the floor.  Have a firm chair that has side arms to use for getting dressed.  Remove throw rugs and other tripping hazards from the floor. WHAT CAN I DO IN THE KITCHEN?   Clean up any spills right away.  Avoid walking on wet floors.  Place frequently used items in easy-to-reach places.  If you need to reach for something above you, use a sturdy step stool that has a grab bar.  Keep electrical cables out of the way.  Do not use floor polish or wax that makes floors slippery. If you have to use wax, make sure that it is non-skid floor wax.  Remove throw rugs and other tripping hazards from the floor. WHAT CAN I DO IN THE STAIRWAYS?  Do not leave any items on the stairs.  Make sure that there are handrails on both sides of the stairs. Fix handrails that are broken or loose. Make sure that handrails are as long as the stairways.  Check any carpeting to make sure that it is firmly attached to the stairs. Fix any carpet that is loose or worn.  Avoid having throw rugs at the top or bottom of stairways, or secure the rugs with carpet tape to prevent them from moving.  Make sure that you have a light switch at the top of the stairs and the bottom of the stairs. If  you do not have them, have them installed. WHAT ARE SOME OTHER FALL PREVENTION TIPS?  Wear closed-toe shoes that fit well and support your feet. Wear shoes that have rubber soles or low heels.  When you use a stepladder, make sure that it is completely opened and that the sides are firmly locked. Have someone hold the ladder while you are using it. Do not climb a closed stepladder.  Add color or contrast paint or tape to grab bars and handrails in your home. Place contrasting color strips on the first and last steps.  Use mobility aids as needed, such as canes, walkers, scooters, and crutches.  Turn on lights if it is dark. Replace any light bulbs that burn out.  Set up furniture so that there are clear  paths. Keep the furniture in the same spot.  Fix any uneven floor surfaces.  Choose a carpet design that does not hide the edge of steps of a stairway.  Be aware of any and all pets.  Review your medicines with your healthcare provider. Some medicines can cause dizziness or changes in blood pressure, which increase your risk of falling. Talk with your health care provider about other ways that you can decrease your risk of falls. This may include working with a physical therapist or trainer to improve your strength, balance, and endurance.   This information is not intended to replace advice given to you by your health care provider. Make sure you discuss any questions you have with your health care provider.   Document Released: 05/21/2002 Document Revised: 10/15/2014 Document Reviewed: 07/05/2014 Elsevier Interactive Patient Education [employer].

## 2015-12-11 ENCOUNTER — Other Ambulatory Visit: Payer: Self-pay | Admitting: Neurology

## 2015-12-12 ENCOUNTER — Other Ambulatory Visit: Payer: Self-pay

## 2015-12-12 MED ORDER — DONEPEZIL HCL 23 MG PO TABS: 23.0000 mg | ORAL_TABLET | Freq: Every day | ORAL | Status: DC

## 2015-12-16 ENCOUNTER — Emergency Department (HOSPITAL_COMMUNITY): Payer: Medicare Other

## 2015-12-16 ENCOUNTER — Encounter (HOSPITAL_COMMUNITY): Payer: Self-pay | Admitting: Emergency Medicine

## 2015-12-16 ENCOUNTER — Inpatient Hospital Stay (HOSPITAL_COMMUNITY)
Admission: EM | Admit: 2015-12-16 | Discharge: 2015-12-18 | DRG: 392 | Disposition: A | Discharge status: Home Health | Payer: Medicare Other | Attending: Family Medicine | Admitting: Family Medicine

## 2015-12-16 DIAGNOSIS — R1031 Right lower quadrant pain: Secondary | ICD-10-CM | POA: Diagnosis not present

## 2015-12-16 DIAGNOSIS — I480 Paroxysmal atrial fibrillation: Secondary | ICD-10-CM | POA: Diagnosis not present

## 2015-12-16 DIAGNOSIS — N183 Chronic kidney disease, stage 3 unspecified: Secondary | ICD-10-CM | POA: Diagnosis present

## 2015-12-16 DIAGNOSIS — R269 Unspecified abnormalities of gait and mobility: Secondary | ICD-10-CM

## 2015-12-16 DIAGNOSIS — D638 Anemia in other chronic diseases classified elsewhere: Secondary | ICD-10-CM | POA: Diagnosis not present

## 2015-12-16 DIAGNOSIS — K5792 Diverticulitis of intestine, part unspecified, without perforation or abscess without bleeding: Secondary | ICD-10-CM | POA: Diagnosis present

## 2015-12-16 DIAGNOSIS — K5730 Diverticulosis of large intestine without perforation or abscess without bleeding: Secondary | ICD-10-CM | POA: Diagnosis not present

## 2015-12-16 DIAGNOSIS — K5732 Diverticulitis of large intestine without perforation or abscess without bleeding: Secondary | ICD-10-CM

## 2015-12-16 DIAGNOSIS — R109 Unspecified abdominal pain: Secondary | ICD-10-CM

## 2015-12-16 DIAGNOSIS — I129 Hypertensive chronic kidney disease with stage 1 through stage 4 chronic kidney disease, or unspecified chronic kidney disease: Secondary | ICD-10-CM | POA: Diagnosis not present

## 2015-12-16 DIAGNOSIS — Z9049 Acquired absence of other specified parts of digestive tract: Secondary | ICD-10-CM

## 2015-12-16 DIAGNOSIS — R1030 Lower abdominal pain, unspecified: Secondary | ICD-10-CM | POA: Diagnosis not present

## 2015-12-16 DIAGNOSIS — R1032 Left lower quadrant pain: Secondary | ICD-10-CM | POA: Diagnosis not present

## 2015-12-16 DIAGNOSIS — Z87891 Personal history of nicotine dependence: Secondary | ICD-10-CM

## 2015-12-16 DIAGNOSIS — Z823 Family history of stroke: Secondary | ICD-10-CM

## 2015-12-16 DIAGNOSIS — R1084 Generalized abdominal pain: Secondary | ICD-10-CM | POA: Diagnosis not present

## 2015-12-16 DIAGNOSIS — F0390 Unspecified dementia without behavioral disturbance: Secondary | ICD-10-CM | POA: Diagnosis not present

## 2015-12-16 DIAGNOSIS — Z8673 Personal history of transient ischemic attack (TIA), and cerebral infarction without residual deficits: Secondary | ICD-10-CM

## 2015-12-16 LAB — COMPREHENSIVE METABOLIC PANEL
ALBUMIN: 3.8 g/dL (ref 3.5–5.0)
ALT: 14 U/L — AB (ref 17–63)
AST: 19 U/L (ref 15–41)
Alkaline Phosphatase: 53 U/L (ref 38–126)
Anion gap: 5 (ref 5–15)
BUN: 25 mg/dL — ABNORMAL HIGH (ref 6–20)
CHLORIDE: 106 mmol/L (ref 101–111)
CO2: 28 mmol/L (ref 22–32)
CREATININE: 1.84 mg/dL — AB (ref 0.61–1.24)
Calcium: 9 mg/dL (ref 8.9–10.3)
GFR calc Af Amer: 35 mL/min — ABNORMAL LOW (ref >60)
GFR calc non Af Amer: 30 mL/min — ABNORMAL LOW (ref >60)
GLUCOSE: 93 mg/dL (ref 65–99)
POTASSIUM: 4.1 mmol/L (ref 3.5–5.1)
SODIUM: 139 mmol/L (ref 135–145)
Total Bilirubin: 1 mg/dL (ref 0.3–1.2)
Total Protein: 6.7 g/dL (ref 6.5–8.1)

## 2015-12-16 LAB — CBC
HEMATOCRIT: 36.4 % — AB (ref 39.0–52.0)
Hemoglobin: 12.1 g/dL — ABNORMAL LOW (ref 13.0–17.0)
MCH: 32.4 pg (ref 26.0–34.0)
MCHC: 33.2 g/dL (ref 30.0–36.0)
MCV: 97.3 fL (ref 78.0–100.0)
PLATELETS: 193 10*3/uL (ref 150–400)
RBC: 3.74 MIL/uL — ABNORMAL LOW (ref 4.22–5.81)
RDW: 12.8 % (ref 11.5–15.5)
WBC: 6.3 10*3/uL (ref 4.0–10.5)

## 2015-12-16 LAB — LIPASE, BLOOD: Lipase: 26 U/L (ref 11–51)

## 2015-12-16 LAB — I-STAT CG4 LACTIC ACID, ED: Lactic Acid, Venous: 0.78 mmol/L (ref 0.5–1.9)

## 2015-12-16 MED ORDER — CHLORHEXIDINE GLUCONATE 0.12 % MT SOLN
15.0000 mL | Freq: Two times a day (BID) | OROMUCOSAL | Status: DC
  Administered 2015-12-16 – 2015-12-18 (×4): 15 mL via OROMUCOSAL
  Filled 2015-12-16 (×3): qty 15

## 2015-12-16 MED ORDER — CETYLPYRIDINIUM CHLORIDE 0.05 % MT LIQD: 7.0000 mL | Freq: Two times a day (BID) | OROMUCOSAL | Status: DC

## 2015-12-16 MED ORDER — ONDANSETRON HCL 4 MG/2ML IJ SOLN
4.0000 mg | Freq: Once | INTRAMUSCULAR | Status: AC
  Administered 2015-12-16: 4 mg via INTRAVENOUS
  Filled 2015-12-16: qty 2

## 2015-12-16 MED ORDER — ACETAMINOPHEN 325 MG PO TABS
650.0000 mg | ORAL_TABLET | ORAL | Status: DC | PRN
  Administered 2015-12-16: 650 mg via ORAL
  Filled 2015-12-16: qty 2

## 2015-12-16 MED ORDER — MEMANTINE HCL 10 MG PO TABS
10.0000 mg | ORAL_TABLET | Freq: Two times a day (BID) | ORAL | Status: DC
  Administered 2015-12-16 – 2015-12-17 (×2): 10 mg via ORAL
  Filled 2015-12-16 (×2): qty 1

## 2015-12-16 MED ORDER — ASPIRIN-DIPYRIDAMOLE ER 25-200 MG PO CP12
1.0000 | ORAL_CAPSULE | Freq: Two times a day (BID) | ORAL | Status: DC
  Administered 2015-12-16 – 2015-12-18 (×4): 1 via ORAL
  Filled 2015-12-16 (×4): qty 1

## 2015-12-16 MED ORDER — PROPRANOLOL HCL ER 60 MG PO CP24
60.0000 mg | ORAL_CAPSULE | Freq: Every day | ORAL | Status: DC
  Administered 2015-12-17 – 2015-12-18 (×2): 60 mg via ORAL
  Filled 2015-12-16 (×2): qty 1

## 2015-12-16 MED ORDER — FENTANYL CITRATE (PF) 100 MCG/2ML IJ SOLN
25.0000 ug | Freq: Once | INTRAMUSCULAR | Status: AC
  Administered 2015-12-16: 25 ug via INTRAVENOUS
  Filled 2015-12-16: qty 2

## 2015-12-16 MED ORDER — PIPERACILLIN-TAZOBACTAM IN DEX 2-0.25 GM/50ML IV SOLN
2.2500 g | Freq: Four times a day (QID) | INTRAVENOUS | Status: DC
  Administered 2015-12-16 – 2015-12-17 (×2): 2.25 g via INTRAVENOUS
  Filled 2015-12-16 (×3): qty 50

## 2015-12-16 MED ORDER — MORPHINE SULFATE (PF) 2 MG/ML IV SOLN
1.0000 mg | INTRAVENOUS | Status: DC | PRN
  Administered 2015-12-17 – 2015-12-18 (×2): 1 mg via INTRAVENOUS
  Filled 2015-12-16 (×2): qty 1

## 2015-12-16 MED ORDER — DONEPEZIL HCL 23 MG PO TABS
23.0000 mg | ORAL_TABLET | Freq: Every day | ORAL | Status: DC
  Administered 2015-12-16: 23 mg via ORAL
  Filled 2015-12-16: qty 1

## 2015-12-16 MED ORDER — DOCUSATE SODIUM 100 MG PO CAPS
200.0000 mg | ORAL_CAPSULE | Freq: Every day | ORAL | Status: DC
  Administered 2015-12-16 – 2015-12-17 (×2): 200 mg via ORAL
  Filled 2015-12-16 (×2): qty 2

## 2015-12-16 MED ORDER — DEXTROSE-NACL 5-0.45 % IV SOLN
INTRAVENOUS | Status: DC
  Administered 2015-12-16 – 2015-12-17 (×2): via INTRAVENOUS

## 2015-12-16 MED ORDER — FENTANYL CITRATE (PF) 100 MCG/2ML IJ SOLN
50.0000 ug | Freq: Once | INTRAMUSCULAR | Status: AC
  Administered 2015-12-16: 50 ug via INTRAVENOUS
  Filled 2015-12-16: qty 2

## 2015-12-16 MED ORDER — SODIUM CHLORIDE 0.9 % IV BOLUS (SEPSIS)
1000.0000 mL | Freq: Once | INTRAVENOUS | Status: AC
  Administered 2015-12-16: 1000 mL via INTRAVENOUS

## 2015-12-16 MED ORDER — FAMOTIDINE IN NACL 20-0.9 MG/50ML-% IV SOLN
20.0000 mg | Freq: Two times a day (BID) | INTRAVENOUS | Status: DC
  Administered 2015-12-16 – 2015-12-17 (×2): 20 mg via INTRAVENOUS
  Filled 2015-12-16 (×2): qty 50

## 2015-12-16 MED ORDER — PIPERACILLIN-TAZOBACTAM 3.375 G IVPB 30 MIN
3.3750 g | Freq: Once | INTRAVENOUS | Status: AC
  Administered 2015-12-16: 3.375 g via INTRAVENOUS
  Filled 2015-12-16: qty 50

## 2015-12-16 MED ORDER — DIATRIZOATE MEGLUMINE & SODIUM 66-10 % PO SOLN
15.0000 mL | Freq: Once | ORAL | Status: AC
  Administered 2015-12-16: 15 mL via ORAL

## 2015-12-16 MED ORDER — ENOXAPARIN SODIUM 30 MG/0.3ML ~~LOC~~ SOLN
30.0000 mg | SUBCUTANEOUS | Status: DC
  Administered 2015-12-16 – 2015-12-17 (×2): 30 mg via SUBCUTANEOUS
  Filled 2015-12-16 (×2): qty 0.3

## 2015-12-16 NOTE — Progress Notes (Signed)
Pharmacy Antibiotic Follow-up Note  [name] is a 82 y.o. year-old male admitted on 12/16/2015.  The patient is currently on day 1 of Zosyn for intra-abd infection.  Assessment/Plan: Zosyn 3.375gm x1, followed by Zosyn 2.25gm q6hr  78 yoM to ED via EMS 7/4 with sudden onset of abd pain, hx of perforated bowel-stated similar pain.  Abd CT: no obstruction, no free air, no abscess noted, only acute diverticulitis/diverticulosis descending & sigmoid colon  Temp (24hrs), Avg:98.1 F (36.7 C), Min:98.1 F (36.7 C), Max:98.1 F (36.7 C)   Recent Labs Lab 12/16/15 1335  WBC 6.3    Recent Labs Lab 12/16/15 1335  CREATININE 1.84*   Estimated Creatinine Clearance: 28.1 mL/min (by C-G formula based on Cr of 1.84).    Allergies  Allergen Reactions  . Topamax [Topiramate]     Antimicrobials this admission: 7/4 Zosyn >>   Levels/dose changes this admission:  Microbiology results: None ordered at this time  Thank you for allowing pharmacy to be a part of this patient's care.  Minda Ditto PharmD Pager [phone] 12/16/2015, 4:19 PM

## 2015-12-16 NOTE — ED Notes (Signed)
Pt brought in by EMS from home. Patient had sudden onset of lower abdominal pain starting about 1 hour ago. Hx of perforated bowel. Reports similar pain. Denies n/v/d.

## 2015-12-16 NOTE — ED Notes (Signed)
Set of blood cultures sent down to lab in case blood cultures ordered at a later time. Blood cultures collected prior to antibiotic start.

## 2015-12-16 NOTE — ED Provider Notes (Signed)
CSN: [id number]     Arrival date & time 12/16/15  1315 History   First MD Initiated Contact with Patient 12/16/15 1338     Chief Complaint  Patient presents with  . Abdominal Pain     (Consider location/radiation/quality/duration/timing/severity/associated sxs/prior Treatment) HPI  82 year old male with a history of CVA, paroxysmal atrial fibrillation, dementia, perforation of bowel, presents with concern for abdominal pain beginning at noon.  Wife reports sudden onset, similar to when he had perforation, and appeared to be severe. Lower abdominal pain.  No n/v/diarrhea/constipation. Pt unable to describe quality or duration given hx of dementia.   Level V caveat dementia   Past Medical History  Diagnosis Date  . CVA (cerebral infarction)   . Memory loss   . Ventral hernia   . Sepsis ([place])   . Essential tremor   . Paroxysmal atrial fibrillation (HCC)   . ED (erectile dysfunction)   . Arthritis   . Stroke [facility])    Past Surgical History  Procedure Laterality Date  . Gallbladder surgery    . Hernia repair    . Perforated intes    . Colon surgery    . Laparoscopic lysis of adhesions N/A 11/30/2014    Procedure: LAPAROSCOPIC LYSIS OF ADHESIONS;  Surgeon: Alphonsa Overall, MD;  Location: [facility];  Service: General;  Laterality: N/A;  . Bowel resection N/A 11/30/2014    Procedure: SMALL BOWEL RESECTION;  Surgeon: Alphonsa Overall, MD;  Location: [facility];  Service: General;  Laterality: N/A;   Family History  Problem Relation Age of Onset  . Aneurysm Mother     stomach  . Dementia Father   . Stroke Father    Social History  Substance Use Topics  . Smoking status: Former Smoker    Types: Cigarettes  . Smokeless tobacco: Never Used  . Alcohol Use: No    Review of Systems  Unable to perform ROS: Dementia  Constitutional: Positive for appetite change. Negative for fever.  Cardiovascular: Negative for chest pain.  Gastrointestinal: Positive for abdominal pain. Negative for nausea,  vomiting, diarrhea and constipation.  Genitourinary: Negative for dysuria.      Allergies  Topamax  Home Medications   Prior to Admission medications   Medication Sig Start Date End Date Taking? Authorizing Provider  cholecalciferol (VITAMIN D) 1000 UNITS tablet Take 2,000 Units by mouth daily.   Yes Historical Provider, MD  dipyridamole-aspirin (AGGRENOX) 200-25 MG per 12 hr capsule Take 1 capsule by mouth 2 (two) times daily.   Yes Historical Provider, MD  docusate sodium (COLACE) 100 MG capsule Take 200 mg by mouth at bedtime.    Yes Historical Provider, MD  donepezil (ARICEPT) 23 MG TABS tablet Take 1 tablet (23 mg total) by mouth at bedtime. 12/12/15  Yes Garvin Fila, MD  fexofenadine (ALLEGRA) 180 MG tablet Take 180 mg by mouth daily.   Yes Historical Provider, MD  fluticasone (FLONASE) 50 MCG/ACT nasal spray Place 2 sprays into the nose at bedtime. 01/01/13  Yes Historical Provider, MD  memantine (NAMENDA) 10 MG tablet Take 1 tablet (10 mg total) by mouth 2 (two) times daily. 02/11/15  Yes Garvin Fila, MD  multivitamin-iron-minerals-folic acid (CENTRUM) chewable tablet Chew 1 tablet by mouth daily.   Yes Historical Provider, MD  multivitamin-lutein (OCUVITE-LUTEIN) CAPS capsule Take 1 capsule by mouth daily.   Yes Historical Provider, MD  propranolol ER (INDERAL LA) 60 MG 24 hr capsule Take 1 capsule (60 mg total) by mouth daily. 02/19/13  Yes  Philmore Pali, NP  vitamin E 400 UNIT capsule Take 400 Units by mouth as directed.   Yes Historical Provider, MD  polyethylene glycol (MIRALAX / GLYCOLAX) packet Take 17 g by mouth daily as needed for moderate constipation (If no BM in 24 hours as needed.). Patient not taking: Reported on 12/16/2015 12/10/14   Earnstine Regal, PA-C   BP 120/51 mmHg  Pulse 61  Temp(Src) 99.5 F (37.5 C) (Oral)  Resp 20  Ht 6' (1.829 m)  Wt 182 lb (82.555 kg)  BMI 24.68 kg/m2  SpO2 95% Physical Exam  Constitutional: He appears well-developed and  well-nourished. No distress.  HENT:  Head: Normocephalic and atraumatic.  Eyes: Conjunctivae and EOM are normal.  Neck: Normal range of motion.  Cardiovascular: Normal rate, regular rhythm, normal heart sounds and intact distal pulses.  Exam reveals no gallop and no friction rub.   No murmur heard. Pulmonary/Chest: Effort normal and breath sounds normal. No respiratory distress. He has no wheezes. He has no rales.  Abdominal: Soft. He exhibits no distension. There is tenderness (bilateral lower quadrants, left worse than right). There is no guarding.  Musculoskeletal: He exhibits no edema.  Neurological: He is alert. GCS eye subscore is 4. GCS verbal subscore is 5. GCS motor subscore is 6.  Skin: Skin is warm and dry. He is not diaphoretic.  Nursing note and vitals reviewed.   ED Course  Procedures (including critical care time) Labs Review Labs Reviewed  COMPREHENSIVE METABOLIC PANEL - Abnormal; Notable for the following:    BUN 25 (*)    Creatinine, Ser 1.84 (*)    ALT 14 (*)    GFR calc non Af Amer 30 (*)    GFR calc Af Amer 35 (*)    All other components within normal limits  CBC - Abnormal; Notable for the following:    RBC 3.74 (*)    Hemoglobin 12.1 (*)    HCT 36.4 (*)    All other components within normal limits  LIPASE, BLOOD  COMPREHENSIVE METABOLIC PANEL  CBC  I-STAT CG4 LACTIC ACID, ED    Imaging Review Ct Abdomen Pelvis Wo Contrast  12/16/2015  CLINICAL DATA:  Sudden onset of lower abdominal pain starting about 1 hour ago. History of perforated bowel. Reports similar pain. EXAM: CT ABDOMEN AND PELVIS WITHOUT CONTRAST TECHNIQUE: Multidetector CT imaging of the abdomen and pelvis was performed following the standard protocol without IV contrast. COMPARISON:  CT abdomen dated 11/30/2014. FINDINGS: Lower chest:  Bibasilar atelectasis. Hepatobiliary: No mass visualized within the liver on this unenhanced exam. Patient is status post cholecystectomy. Pancreas: No mass or  inflammatory process identified on this un-enhanced exam. Spleen: Within normal limits in size. Adrenals/Urinary Tract: Bilateral renal cysts, better characterized on earlier CT abdomen with contrast. No renal stone or hydronephrosis seen bilaterally. No ureteral or bladder calculi. Bladder is decompressed. Stomach/Bowel: There is extensive diverticulosis of the sigmoid and lower descending colon. There is thickening of the walls of the lower descending colon and/or upper sigmoid colon, with associated pericolic inflammation, consistent with acute diverticulitis. No dilated large or small bowel loops. Stomach appears normal. Vascular/Lymphatic: Atherosclerotic changes of the normal caliber abdominal aorta. No enlarged lymph nodes seen in the abdomen or pelvis. Reproductive: No mass or other significant abnormality. Other: Trace free fluid in the lower pelvis. No abscess collections seen. No free intraperitoneal air. Musculoskeletal: Surgical changes of a previous anterior abdominal wall hernia repair, with stable diastases superiorly. Stable left inguinal hernia which contains  mesenteric fat only. Degenerative changes again noted throughout the slightly scoliotic thoracolumbar spine, at least moderate in degree. No acute or suspicious osseous finding. IMPRESSION: 1. Acute diverticulitis of the lower descending colon and/or upper sigmoid colon with associated bowel wall thickening and paracolic inflammation, involving approximately 7 cm segment of the colon. No paracolic abscess collection or free intraperitoneal air. No associated bowel obstruction. 2. Extensive diverticulosis of the sigmoid and descending colon. 3. Aortic atherosclerosis. Additional chronic/incidental findings detailed above. Electronically Signed   By: Franki Cabot M.D.   On: 12/16/2015 14:57   I have personally reviewed and evaluated these images and lab results as part of my medical decision-making.   EKG Interpretation None      MDM    Final diagnoses:  Abdominal pain  Diverticulitis of large intestine without perforation or abscess without bleeding   82 year old male with a history of CVA, paroxysmal atrial fibrillation, dementia, perforation of bowel, presents with concern for abdominal pain beginning at noon. CT abdomen and pelvis shows evidence of diverticulitis without perforation or abscess.  After discussion of with family, given patient's age, history of dementia, history of prior bowel perforation, feel he is appropriate for initial inpatient treatment for diverticulitis. He was given IV Zosyn, and was admitted to Dr. Dreama Saa.   Gareth Morgan, MD 12/16/15 2142

## 2015-12-16 NOTE — H&P (Signed)
History and Physical  [name] [id number] DOB: Jun 01, 1921 DOA: 12/16/2015  PCP:  Simona Huh, MD   Chief Complaint:  Abdominal pain  History of Present Illness:  [name] with history of Dementia, perforated small bowel s/p resection, tremors, stroke who came with cc of abdominal pain, mainly in left abdomen, not associated with nausea, vomiting or diarrhea. No bloody stool or melena or hematemesis. No fever or chills. No other complaints. No confusion. Patient is a poor historian and hx was taken from family at bedside.   Review of Systems:  CONSTITUTIONAL:     No night sweats.  No fatigue.  No fever. No chills. Eyes:                            No visual changes.  No eye pain.  No eye discharge.   ENT:                              No epistaxis.  No sinus pain.  No sore throat.   No congestion. RESPIRATORY:           No cough.  No wheeze.  No hemoptysis.  No dyspnea CARDIOVASCULAR   :  No chest pains.  No palpitations. GASTROINTESTINAL:  ++abdominal pain.  No nausea. No vomiting.  No diarrhea. No                                         constipation.  No hematemesis.  No hematochezia.  No melena. GENITOURINARY:      No urgency.  No frequency.  No dysuria.  No hematuria.  No  obstructive symptoms.  No discharge.  No pain.   MUSCULOSKELETAL:  No musculoskeletal pain.  No joint swelling.  No arthritis. NEUROLOGICAL:        No confusion.  No weakness. No headache. No seizure. PSYCHIATRIC:             No depression. No anxiety. No suicidal ideation. SKIN:                             No rashes.  No lesions.  No wounds. ENDOCRINE:                No weight loss.  No polydipsia.  No polyuria.  No polyphagia. HEMATOLOGIC:           No purpura.  No petechiae.  No bleeding.  ALLERGIC                 : No pruritus.  No angioedema Other:  Past Medical and Surgical History:   Past Medical History  Diagnosis Date  . CVA (cerebral infarction)   . Memory loss     . Ventral hernia   . Sepsis ([place])   . Essential tremor   . Paroxysmal atrial fibrillation (HCC)   . ED (erectile dysfunction)   . Arthritis   . Stroke [facility])    Past Surgical History  Procedure Laterality Date  . Gallbladder surgery    . Hernia repair    . Perforated intes    . Colon surgery    . Laparoscopic lysis of adhesions N/A 11/30/2014    Procedure: LAPAROSCOPIC LYSIS OF ADHESIONS;  Surgeon: Alphonsa Overall, MD;  Location: [facility];  Service: General;  Laterality: N/A;  . Bowel resection N/A 11/30/2014    Procedure: SMALL BOWEL RESECTION;  Surgeon: Alphonsa Overall, MD;  Location: [facility];  Service: General;  Laterality: N/A;    Social History:   reports that he has quit smoking. His smoking use included Cigarettes. He has never used smokeless tobacco. He reports that he does not drink alcohol or use illicit drugs.    Allergies  Allergen Reactions  . Topamax [Topiramate]     Family History  Problem Relation Age of Onset  . Aneurysm Mother     stomach  . Dementia Father   . Stroke Father       Prior to Admission medications   Medication Sig Start Date End Date Taking? Authorizing Provider  cholecalciferol (VITAMIN D) 1000 UNITS tablet Take 2,000 Units by mouth daily.   Yes Historical Provider, MD  dipyridamole-aspirin (AGGRENOX) 200-25 MG per 12 hr capsule Take 1 capsule by mouth 2 (two) times daily.   Yes Historical Provider, MD  docusate sodium (COLACE) 100 MG capsule Take 200 mg by mouth at bedtime.    Yes Historical Provider, MD  donepezil (ARICEPT) 23 MG TABS tablet Take 1 tablet (23 mg total) by mouth at bedtime. 12/12/15  Yes Garvin Fila, MD  fexofenadine (ALLEGRA) 180 MG tablet Take 180 mg by mouth daily.   Yes Historical Provider, MD  fluticasone (FLONASE) 50 MCG/ACT nasal spray Place 2 sprays into the nose at bedtime. 01/01/13  Yes Historical Provider, MD  memantine (NAMENDA) 10 MG tablet Take 1 tablet (10 mg total) by mouth 2 (two) times daily. 02/11/15  Yes  Garvin Fila, MD  multivitamin-iron-minerals-folic acid (CENTRUM) chewable tablet Chew 1 tablet by mouth daily.   Yes Historical Provider, MD  multivitamin-lutein (OCUVITE-LUTEIN) CAPS capsule Take 1 capsule by mouth daily.   Yes Historical Provider, MD  propranolol ER (INDERAL LA) 60 MG 24 hr capsule Take 1 capsule (60 mg total) by mouth daily. 02/19/13  Yes Philmore Pali, NP  vitamin E 400 UNIT capsule Take 400 Units by mouth as directed.   Yes Historical Provider, MD  polyethylene glycol (MIRALAX / GLYCOLAX) packet Take 17 g by mouth daily as needed for moderate constipation (If no BM in 24 hours as needed.). Patient not taking: Reported on 12/16/2015 12/10/14   Earnstine Regal, PA-C    Physical Exam: BP 124/61 mmHg  Pulse 66  Temp(Src) 98.5 F (36.9 C) (Oral)  Resp 20  Ht 6' (1.829 m)  Wt 82.555 kg (182 lb)  BMI 24.68 kg/m2  SpO2 95%  GENERAL :   Alert and cooperative, and appears to be in no acute distress. HEAD:           normocephalic. EYES:            PERRL, EOMI.  vision is grossly intact. EARS:           hearing grossly intact. NECK:          supple, non-tender.  CARDIAC:    Normal S1 and S2. No gallop. No murmurs.  Vascular:     no peripheral edema.  LUNGS:       Clear to auscultation  ABDOMEN: Positive bowel sounds. Soft, distended, tender to palpation in LUQ/LLQ. No guarding or rebound.      MSK:           No joint erythema or tenderness. Normal muscular development. EXT           :  No significant deformity or joint abnormality. Neuro        : Alert, oriented to person, place, and time.                      CN II-XII intact.   SKIN:            No rash. No lesions. PSYCH:       No hallucination. Patient is not suicidal.          Labs on Admission:  Reviewed.   Radiological Exams on Admission: Ct Abdomen Pelvis Wo Contrast  12/16/2015  CLINICAL DATA:  Sudden onset of lower abdominal pain starting about 1 hour ago. History of perforated bowel. Reports similar pain.  EXAM: CT ABDOMEN AND PELVIS WITHOUT CONTRAST TECHNIQUE: Multidetector CT imaging of the abdomen and pelvis was performed following the standard protocol without IV contrast. COMPARISON:  CT abdomen dated 11/30/2014. FINDINGS: Lower chest:  Bibasilar atelectasis. Hepatobiliary: No mass visualized within the liver on this unenhanced exam. Patient is status post cholecystectomy. Pancreas: No mass or inflammatory process identified on this un-enhanced exam. Spleen: Within normal limits in size. Adrenals/Urinary Tract: Bilateral renal cysts, better characterized on earlier CT abdomen with contrast. No renal stone or hydronephrosis seen bilaterally. No ureteral or bladder calculi. Bladder is decompressed. Stomach/Bowel: There is extensive diverticulosis of the sigmoid and lower descending colon. There is thickening of the walls of the lower descending colon and/or upper sigmoid colon, with associated pericolic inflammation, consistent with acute diverticulitis. No dilated large or small bowel loops. Stomach appears normal. Vascular/Lymphatic: Atherosclerotic changes of the normal caliber abdominal aorta. No enlarged lymph nodes seen in the abdomen or pelvis. Reproductive: No mass or other significant abnormality. Other: Trace free fluid in the lower pelvis. No abscess collections seen. No free intraperitoneal air. Musculoskeletal: Surgical changes of a previous anterior abdominal wall hernia repair, with stable diastases superiorly. Stable left inguinal hernia which contains mesenteric fat only. Degenerative changes again noted throughout the slightly scoliotic thoracolumbar spine, at least moderate in degree. No acute or suspicious osseous finding. IMPRESSION: 1. Acute diverticulitis of the lower descending colon and/or upper sigmoid colon with associated bowel wall thickening and paracolic inflammation, involving approximately 7 cm segment of the colon. No paracolic abscess collection or free intraperitoneal air. No  associated bowel obstruction. 2. Extensive diverticulosis of the sigmoid and descending colon. 3. Aortic atherosclerosis. Additional chronic/incidental findings detailed above. Electronically Signed   By: Franki Cabot M.D.   On: 12/16/2015 14:57     Assessment/Plan  Acute diverticulitis:  Started on Zosyn NPO, IVF @ 75 cc/hr.  Morphine prn pain  Pepcid IV BID  Dementia: cont home meds  HTN: cont home meds  CVA: cont asp.  Input & Output:  NA Lines & Tubes: PIV DVT prophylaxis:  [place] enoxaparin  GI prophylaxis: Pepcid  Consultants: NA Code Status: Full Family Communication: At bedside  Disposition Plan: Obs for now    Gennaro Africa M.D [facility]

## 2015-12-16 NOTE — ED Notes (Signed)
Bed: WA03 Expected date:  Expected time:  Means of arrival:  Comments: 82 yo abdominal pain

## 2015-12-17 DIAGNOSIS — F0390 Unspecified dementia without behavioral disturbance: Secondary | ICD-10-CM | POA: Diagnosis not present

## 2015-12-17 DIAGNOSIS — D631 Anemia in chronic kidney disease: Secondary | ICD-10-CM

## 2015-12-17 DIAGNOSIS — N183 Chronic kidney disease, stage 3 (moderate): Secondary | ICD-10-CM | POA: Diagnosis not present

## 2015-12-17 DIAGNOSIS — D638 Anemia in other chronic diseases classified elsewhere: Secondary | ICD-10-CM | POA: Diagnosis present

## 2015-12-17 DIAGNOSIS — K5732 Diverticulitis of large intestine without perforation or abscess without bleeding: Principal | ICD-10-CM

## 2015-12-17 DIAGNOSIS — N184 Chronic kidney disease, stage 4 (severe): Secondary | ICD-10-CM | POA: Diagnosis not present

## 2015-12-17 DIAGNOSIS — I480 Paroxysmal atrial fibrillation: Secondary | ICD-10-CM | POA: Diagnosis present

## 2015-12-17 DIAGNOSIS — Z9049 Acquired absence of other specified parts of digestive tract: Secondary | ICD-10-CM | POA: Diagnosis not present

## 2015-12-17 DIAGNOSIS — Z823 Family history of stroke: Secondary | ICD-10-CM | POA: Diagnosis not present

## 2015-12-17 DIAGNOSIS — Z87891 Personal history of nicotine dependence: Secondary | ICD-10-CM | POA: Diagnosis not present

## 2015-12-17 DIAGNOSIS — R269 Unspecified abnormalities of gait and mobility: Secondary | ICD-10-CM | POA: Diagnosis not present

## 2015-12-17 DIAGNOSIS — N189 Chronic kidney disease, unspecified: Secondary | ICD-10-CM

## 2015-12-17 DIAGNOSIS — Z8673 Personal history of transient ischemic attack (TIA), and cerebral infarction without residual deficits: Secondary | ICD-10-CM | POA: Diagnosis not present

## 2015-12-17 DIAGNOSIS — I129 Hypertensive chronic kidney disease with stage 1 through stage 4 chronic kidney disease, or unspecified chronic kidney disease: Secondary | ICD-10-CM | POA: Diagnosis present

## 2015-12-17 DIAGNOSIS — R1030 Lower abdominal pain, unspecified: Secondary | ICD-10-CM | POA: Diagnosis not present

## 2015-12-17 LAB — COMPREHENSIVE METABOLIC PANEL
ALT: 16 U/L — ABNORMAL LOW (ref 17–63)
ANION GAP: 6 (ref 5–15)
AST: 28 U/L (ref 15–41)
Albumin: 3.1 g/dL — ABNORMAL LOW (ref 3.5–5.0)
Alkaline Phosphatase: 43 U/L (ref 38–126)
BILIRUBIN TOTAL: 1.8 mg/dL — AB (ref 0.3–1.2)
BUN: 24 mg/dL — ABNORMAL HIGH (ref 6–20)
CHLORIDE: 106 mmol/L (ref 101–111)
CO2: 25 mmol/L (ref 22–32)
Calcium: 8.1 mg/dL — ABNORMAL LOW (ref 8.9–10.3)
Creatinine, Ser: 1.93 mg/dL — ABNORMAL HIGH (ref 0.61–1.24)
GFR, EST AFRICAN AMERICAN: 33 mL/min — AB (ref >60)
GFR, EST NON AFRICAN AMERICAN: 28 mL/min — AB (ref >60)
Glucose, Bld: 112 mg/dL — ABNORMAL HIGH (ref 65–99)
POTASSIUM: 4 mmol/L (ref 3.5–5.1)
Sodium: 137 mmol/L (ref 135–145)
TOTAL PROTEIN: 5.6 g/dL — AB (ref 6.5–8.1)

## 2015-12-17 LAB — CBC
HEMATOCRIT: 33.5 % — AB (ref 39.0–52.0)
HEMOGLOBIN: 11 g/dL — AB (ref 13.0–17.0)
MCH: 31.8 pg (ref 26.0–34.0)
MCHC: 32.8 g/dL (ref 30.0–36.0)
MCV: 96.8 fL (ref 78.0–100.0)
Platelets: 166 10*3/uL (ref 150–400)
RBC: 3.46 MIL/uL — AB (ref 4.22–5.81)
RDW: 12.9 % (ref 11.5–15.5)
WBC: 7.5 10*3/uL (ref 4.0–10.5)

## 2015-12-17 LAB — GLUCOSE, CAPILLARY: Glucose-Capillary: 105 mg/dL — ABNORMAL HIGH (ref 65–99)

## 2015-12-17 MED ORDER — PIPERACILLIN-TAZOBACTAM 3.375 G IVPB
3.3750 g | Freq: Three times a day (TID) | INTRAVENOUS | Status: DC
  Administered 2015-12-17 – 2015-12-18 (×4): 3.375 g via INTRAVENOUS
  Filled 2015-12-17 (×5): qty 50

## 2015-12-17 MED ORDER — DONEPEZIL HCL 23 MG PO TABS
23.0000 mg | ORAL_TABLET | Freq: Every day | ORAL | Status: DC
  Administered 2015-12-17: 23 mg via ORAL
  Filled 2015-12-17: qty 1

## 2015-12-17 MED ORDER — DONEPEZIL HCL 10 MG PO TABS: 5.0000 mg | ORAL_TABLET | Freq: Every day | ORAL | Status: DC

## 2015-12-17 MED ORDER — MEMANTINE HCL 5 MG PO TABS
5.0000 mg | ORAL_TABLET | Freq: Two times a day (BID) | ORAL | Status: DC
  Administered 2015-12-17 – 2015-12-18 (×2): 5 mg via ORAL
  Filled 2015-12-17 (×2): qty 1

## 2015-12-17 MED ORDER — ONDANSETRON HCL 4 MG/2ML IJ SOLN
4.0000 mg | Freq: Four times a day (QID) | INTRAMUSCULAR | Status: DC | PRN
Start: 2015-12-17 — End: 2015-12-18
  Administered 2015-12-17: 4 mg via INTRAVENOUS
  Filled 2015-12-17: qty 2

## 2015-12-17 MED ORDER — FAMOTIDINE IN NACL 20-0.9 MG/50ML-% IV SOLN
20.0000 mg | INTRAVENOUS | Status: DC
  Administered 2015-12-18: 20 mg via INTRAVENOUS
  Filled 2015-12-17: qty 50

## 2015-12-17 NOTE — Evaluation (Signed)
Physical Therapy Evaluation Patient Details Name: [name] MRN: [id number] DOB: January 17, 1921 Today's Date: 12/17/2015   History of Present Illness  [name] with history of Dementia, perforated small bowel s/p resection, tremors, stroke who came with cc of abdominal pain, mainly in left abdomen, not associated with nausea, vomiting or diarrhea  Clinical Impression  Pt admitted with above diagnosis. Pt currently with functional limitations due to the deficits listed below (see PT Problem List). * Pt will benefit from skilled PT to increase their independence and safety with mobility to allow discharge to the venue listed below.   Pt will benefit from HHPT at D/C; will follow     Follow Up Recommendations Home health PT;Supervision/Assistance - 24 hour    Equipment Recommendations  None recommended by PT    Recommendations for Other Services       Precautions / Restrictions Precautions Precautions: Fall      Mobility  Bed Mobility Overal bed mobility: Needs Assistance Bed Mobility: Supine to Sit     Supine to sit: Mod assist;HOB elevated     General bed mobility comments: assist with trunk to upright, incr time  Transfers Overall transfer level: Needs assistance Equipment used: Rolling walker (2 wheeled) Transfers: Sit to/from Stand Sit to Stand: Min assist         General transfer comment: cues for hand placement, assist with wt shift to stand, cues for control of descent  Ambulation/Gait Ambulation/Gait assistance: Min assist;Mod assist Ambulation Distance (Feet): 75 Feet Assistive device: Rolling walker (2 wheeled) Gait Pattern/deviations: Step-to pattern;Step-through pattern;Decreased stride length;Trunk flexed     General Gait Details: cues for posture,  RW safety;  pt with overt LOB to right side requiring mod assist to recover;   Stairs            Wheelchair Mobility    Modified Rankin (Stroke Patients Only)       Balance  Overall balance assessment: Needs assistance           Standing balance-Leahy Scale: Fair Standing balance comment: LOB x1 during gait, delayed reactions, mod assist to recover                             Pertinent Vitals/Pain Pain Assessment: No/denies pain    Home Living Family/patient expects to be discharged to:: Private residence Living Arrangements: Spouse/significant other Available Help at Discharge: Family;Available 24 hours/day (wife and grand-dtr) Type of Home: House Home Access: Stairs to enter   [employer] of Steps: 3 Home Layout: One level Home Equipment: Walker - 2 wheels      Prior Function Level of Independence: Independent with assistive device(s)         Comments: amb with RW     Hand Dominance        Extremity/Trunk Assessment   Upper Extremity Assessment: Generalized weakness;Defer to OT evaluation           Lower Extremity Assessment: Generalized weakness      Cervical / Trunk Assessment: Kyphotic  Communication   Communication: No difficulties  Cognition Arousal/Alertness: Awake/alert Behavior During Therapy: WFL for tasks assessed/performed Overall Cognitive Status: History of cognitive impairments - at baseline                      General Comments      Exercises        Assessment/Plan    PT Assessment Patient  needs continued PT services  PT Diagnosis Difficulty walking   PT Problem List Decreased strength;Decreased activity tolerance;Decreased balance;Decreased mobility;Decreased cognition  PT Treatment Interventions DME instruction;Gait training;Functional mobility training;Therapeutic activities;Therapeutic exercise;Patient/family education   PT Goals (Current goals can be found in the Care Plan section) Acute Rehab PT Goals Patient Stated Goal: none stated PT Goal Formulation: With patient Time For Goal Achievement: 12/24/15 Potential to Achieve Goals: Good    Frequency Min  3X/week   Barriers to discharge        Co-evaluation               End of Session   Activity Tolerance: Patient tolerated treatment well Patient left: in chair;with call bell/phone within reach;with chair alarm set Nurse Communication: Mobility status    Functional Assessment Tool Used: clinical judgement Functional Limitation: Mobility: Walking and moving around Mobility: Walking and Moving Around Current Status [id number]): At least 40 percent but less than 60 percent impaired, limited or restricted Mobility: Walking and Moving Around Goal Status [phone]): At least 1 percent but less than 20 percent impaired, limited or restricted    Time: 1130-1143 PT Time Calculation (min) (ACUTE ONLY): 13 min   Charges:   PT Evaluation $PT Eval Low Complexity: 1 Procedure     PT G Codes:   PT G-Codes **NOT FOR INPATIENT CLASS** Functional Assessment Tool Used: clinical judgement Functional Limitation: Mobility: Walking and moving around Mobility: Walking and Moving Around Current Status [id number]): At least 40 percent but less than 60 percent impaired, limited or restricted Mobility: Walking and Moving Around Goal Status [id number]): At least 1 percent but less than 20 percent impaired, limited or restricted    [facility] 12/17/2015, 12:13 PM

## 2015-12-17 NOTE — Progress Notes (Signed)
Pharmacy Antibiotic Note  [name] is a 82 y.o. male admitted on 12/16/2015 with acute diverticulitis.  Pharmacy has been consulted for Zosyn dosing.  Plan: Adjust Zosyn to 3.375g IV q8h (infuse each dose over 4 hours) for CrCl > 20 ml/min. Monitor renal function, culture results as available, clinical course.   Height: 6' (182.9 cm) Weight: 182 lb (82.555 kg) IBW/kg (Calculated) : 77.6  Temp (24hrs), Avg:99 F (37.2 C), Min:98.1 F (36.7 C), Max:100.1 F (37.8 C)   Recent Labs Lab 12/16/15 1335 12/16/15 1552 12/17/15 0454  WBC 6.3  --  7.5  CREATININE 1.84*  --  1.93*  LATICACIDVEN  --  0.78  --     Estimated Creatinine Clearance: 26.8 mL/min (by C-G formula based on Cr of 1.93).    Allergies  Allergen Reactions  . Topamax [Topiramate]     Antimicrobials this admission: 7/4 >> Zosyn >>  Dose adjustments this admission: 7/5: Adjusted Zosyn to EI for CrCl > 20 ml/min   Microbiology results: None ordered  Thank you for allowing pharmacy to be a part of this patient's care.   Lindell Spar, PharmD, BCPS Pager: [phone] 12/17/2015 8:12 AM

## 2015-12-17 NOTE — Progress Notes (Addendum)
PROGRESS NOTE    [name]  [id number]  DOB: [birth year]  DOA: 12/16/2015 PCP: Simona Huh, MD Outpatient Specialists:   Hospital course: [name] with history of Dementia, perforated small bowel s/p resection, tremors, stroke who came with cc of abdominal pain, mainly in left abdomen, not associated with nausea, vomiting or diarrhea. No bloody stool or melena or hematemesis. No fever or chills. No other complaints. No confusion. Patient is a poor historian and history was taken from family at bedside.   Assessment & Plan:   1. Acute Diverticulitis - managing with IV zosyn for now, abdominal pain present but improving, plan to switch to oral antibiotics tomorrow.  Follow up with Dr. Lucia Gaskins (his surgeon).  2. Hypertension - resuming home medications and following.  3. Cerebrovascular disease s/p CVA - resume home medications, currently has been stable.  4. Dementia - continue donepezil as ordered.   5. CKD stage 3 -for CrCl ~ 27 ml/min, reducing famotidine to ONCE daily and namenda to 5mg  BID per pharmacy recommendations.  6. Anemia, chronic disease - following CBC.    DVT prophylaxis: [place] enoxaparin  GI prophylaxis: Pepcid  Consultants: NA Code Status: Full Family Communication: Wife At bedside  Disposition Plan: Obs  Antimicrobials: Anti-infectives    Start     Dose/Rate Route Frequency Ordered Stop   12/17/15 1000  piperacillin-tazobactam (ZOSYN) IVPB 3.375 g     3.375 g 12.5 mL/hr over 240 Minutes Intravenous Every 8 hours 12/17/15 0809     12/16/15 2200  piperacillin-tazobactam (ZOSYN) IVPB 2.25 g  Status:  Discontinued     2.25 g 100 mL/hr over 30 Minutes Intravenous Every 6 hours 12/16/15 1616 12/17/15 0809   12/16/15 1545  piperacillin-tazobactam (ZOSYN) IVPB 3.375 g     3.375 g 100 mL/hr over 30 Minutes Intravenous  Once 12/16/15 1530 12/16/15 1621      Subjective: Pt without complaints, tolerated orange juice this morning.    Objective: Filed Vitals:   12/16/15 1651 12/16/15 2115 12/16/15 2300 12/17/15 0558  BP: 120/51 113/45  111/44  Pulse: 61 56  50  Temp: 99.5 F (37.5 C) 100.1 F (37.8 C) 99.1 F (37.3 C) 98.4 F (36.9 C)  TempSrc: Oral Oral Oral Oral  Resp: 20 17  18   Height:      Weight:      SpO2: 95% 96%  95%   No intake or output data in the 24 hours ending 12/17/15 0906 Filed Weights   12/16/15 1325  Weight: 182 lb (82.555 kg)   Exam:  General exam: awake, alert, no apparent distress, cooperative, pleasant Respiratory system: Clear. No increased work of breathing. Cardiovascular system: S1 & S2 heard, RRR. No JVD, murmurs, gallops, clicks or pedal edema. Gastrointestinal system: Abdomen is nondistended, soft and tender LLQ, no guarding. Normal bowel sounds heard. Central nervous system: Alert and oriented. No focal neurological deficits. Extremities: No CCE.  Data Reviewed: Basic Metabolic Panel:  Recent Labs Lab 12/16/15 1335 12/17/15 0454  NA 139 137  K 4.1 4.0  CL 106 106  CO2 28 25  GLUCOSE 93 112*  BUN 25* 24*  CREATININE 1.84* 1.93*  CALCIUM 9.0 8.1*   Liver Function Tests:  Recent Labs Lab 12/16/15 1335 12/17/15 0454  AST 19 28  ALT 14* 16*  ALKPHOS 53 43  BILITOT 1.0 1.8*  PROT 6.7 5.6*  ALBUMIN 3.8 3.1*    Recent Labs Lab 12/16/15 1335  LIPASE 26   No results  for input(s): AMMONIA in the last 168 hours. CBC:  Recent Labs Lab 12/16/15 1335 12/17/15 0454  WBC 6.3 7.5  HGB 12.1* 11.0*  HCT 36.4* 33.5*  MCV 97.3 96.8  PLT 193 166   Cardiac Enzymes: No results for input(s): CKTOTAL, CKMB, CKMBINDEX, TROPONINI in the last 168 hours. BNP (last 3 results) No results for input(s): PROBNP in the last 8760 hours. CBG:  Recent Labs Lab 12/17/15 0049  GLUCAP 105*    No results found for this or any previous visit (from the past 240 hour(s)).   Studies: Ct Abdomen Pelvis Wo Contrast  12/16/2015  CLINICAL DATA:  Sudden onset of lower  abdominal pain starting about 1 hour ago. History of perforated bowel. Reports similar pain. EXAM: CT ABDOMEN AND PELVIS WITHOUT CONTRAST TECHNIQUE: Multidetector CT imaging of the abdomen and pelvis was performed following the standard protocol without IV contrast. COMPARISON:  CT abdomen dated 11/30/2014. FINDINGS: Lower chest:  Bibasilar atelectasis. Hepatobiliary: No mass visualized within the liver on this unenhanced exam. Patient is status post cholecystectomy. Pancreas: No mass or inflammatory process identified on this un-enhanced exam. Spleen: Within normal limits in size. Adrenals/Urinary Tract: Bilateral renal cysts, better characterized on earlier CT abdomen with contrast. No renal stone or hydronephrosis seen bilaterally. No ureteral or bladder calculi. Bladder is decompressed. Stomach/Bowel: There is extensive diverticulosis of the sigmoid and lower descending colon. There is thickening of the walls of the lower descending colon and/or upper sigmoid colon, with associated pericolic inflammation, consistent with acute diverticulitis. No dilated large or small bowel loops. Stomach appears normal. Vascular/Lymphatic: Atherosclerotic changes of the normal caliber abdominal aorta. No enlarged lymph nodes seen in the abdomen or pelvis. Reproductive: No mass or other significant abnormality. Other: Trace free fluid in the lower pelvis. No abscess collections seen. No free intraperitoneal air. Musculoskeletal: Surgical changes of a previous anterior abdominal wall hernia repair, with stable diastases superiorly. Stable left inguinal hernia which contains mesenteric fat only. Degenerative changes again noted throughout the slightly scoliotic thoracolumbar spine, at least moderate in degree. No acute or suspicious osseous finding. IMPRESSION: 1. Acute diverticulitis of the lower descending colon and/or upper sigmoid colon with associated bowel wall thickening and paracolic inflammation, involving approximately 7  cm segment of the colon. No paracolic abscess collection or free intraperitoneal air. No associated bowel obstruction. 2. Extensive diverticulosis of the sigmoid and descending colon. 3. Aortic atherosclerosis. Additional chronic/incidental findings detailed above. Electronically Signed   By: Franki Cabot M.D.   On: 12/16/2015 14:57    Scheduled Meds: . antiseptic oral rinse  7 mL Mouth Rinse q12n4p  . chlorhexidine  15 mL Mouth Rinse BID  . dipyridamole-aspirin  1 capsule Oral BID  . docusate sodium  200 mg Oral QHS  . donepezil  23 mg Oral QHS  . enoxaparin (LOVENOX) injection  30 mg Subcutaneous Q24H  . famotidine (PEPCID) IV  20 mg Intravenous Q12H  . memantine  10 mg Oral BID  . piperacillin-tazobactam (ZOSYN)  IV  3.375 g Intravenous Q8H  . propranolol ER  60 mg Oral Daily   Continuous Infusions: . dextrose 5 % and 0.45% NaCl 75 mL/hr at 12/17/15 0630   Active Problems:   Diverticulitis  Time spent:   Irwin Brakeman, MD, FAAFP [facility] Pager [phone]  If 7PM-7AM, please contact night-coverage [email or web address] Password [email or web address] 12/17/2015, 9:06 AM

## 2015-12-18 DIAGNOSIS — F0390 Unspecified dementia without behavioral disturbance: Secondary | ICD-10-CM

## 2015-12-18 DIAGNOSIS — R269 Unspecified abnormalities of gait and mobility: Secondary | ICD-10-CM

## 2015-12-18 DIAGNOSIS — N183 Chronic kidney disease, stage 3 unspecified: Secondary | ICD-10-CM | POA: Diagnosis present

## 2015-12-18 LAB — BASIC METABOLIC PANEL
Anion gap: 4 — ABNORMAL LOW (ref 5–15)
BUN: 17 mg/dL (ref 6–20)
CALCIUM: 8.2 mg/dL — AB (ref 8.9–10.3)
CO2: 26 mmol/L (ref 22–32)
CREATININE: 1.86 mg/dL — AB (ref 0.61–1.24)
Chloride: 109 mmol/L (ref 101–111)
GFR calc Af Amer: 35 mL/min — ABNORMAL LOW (ref >60)
GFR, EST NON AFRICAN AMERICAN: 30 mL/min — AB (ref >60)
GLUCOSE: 94 mg/dL (ref 65–99)
Potassium: 3.6 mmol/L (ref 3.5–5.1)
SODIUM: 139 mmol/L (ref 135–145)

## 2015-12-18 MED ORDER — MEMANTINE HCL 5 MG PO TABS: 5.0000 mg | ORAL_TABLET | Freq: Two times a day (BID) | ORAL | Status: DC

## 2015-12-18 MED ORDER — METRONIDAZOLE 500 MG PO TABS: 500.0000 mg | ORAL_TABLET | Freq: Two times a day (BID) | ORAL | Status: DC

## 2015-12-18 MED ORDER — CIPROFLOXACIN HCL 250 MG PO TABS: 250.0000 mg | ORAL_TABLET | Freq: Two times a day (BID) | ORAL | Status: AC

## 2015-12-18 NOTE — Discharge Instructions (Signed)
Diverticulitis Diverticulitis is inflammation or infection of small pouches in your colon that form when you have a condition called diverticulosis. The pouches in your colon are called diverticula. Your colon, or large intestine, is where water is absorbed and stool is formed. Complications of diverticulitis can include:  Bleeding.  Severe infection.  Severe pain.  Perforation of your colon.  Obstruction of your colon. CAUSES  Diverticulitis is caused by bacteria. Diverticulitis happens when stool becomes trapped in diverticula. This allows bacteria to grow in the diverticula, which can lead to inflammation and infection. RISK FACTORS People with diverticulosis are at risk for diverticulitis. Eating a diet that does not include enough fiber from fruits and vegetables may make diverticulitis more likely to develop. SYMPTOMS  Symptoms of diverticulitis may include:  Abdominal pain and tenderness. The pain is normally located on the left side of the abdomen, but may occur in other areas.  Fever and chills.  Bloating.  Cramping.  Nausea.  Vomiting.  Constipation.  Diarrhea.  Blood in your stool. DIAGNOSIS  Your health care provider will ask you about your medical history and do a physical exam. You may need to have tests done because many medical conditions can cause the same symptoms as diverticulitis. Tests may include:  Blood tests.  Urine tests.  Imaging tests of the abdomen, including X-rays and CT scans. When your condition is under control, your health care provider may recommend that you have a colonoscopy. A colonoscopy can show how severe your diverticula are and whether something else is causing your symptoms. TREATMENT  Most cases of diverticulitis are mild and can be treated at home. Treatment may include:  Taking over-the-counter pain medicines.  Following a clear liquid diet.  Taking antibiotic medicines by mouth for 7-10 days. More severe cases may  be treated at a hospital. Treatment may include:  Not eating or drinking.  Taking prescription pain medicine.  Receiving antibiotic medicines through an IV tube.  Receiving fluids and nutrition through an IV tube.  Surgery. HOME CARE INSTRUCTIONS   Follow your health care provider's instructions carefully.  Follow a full liquid diet or other diet as directed by your health care provider. After your symptoms improve, your health care provider may tell you to change your diet. He or she may recommend you eat a high-fiber diet. Fruits and vegetables are good sources of fiber. Fiber makes it easier to pass stool.  Take fiber supplements or probiotics as directed by your health care provider.  Only take medicines as directed by your health care provider.  Keep all your follow-up appointments. SEEK MEDICAL CARE IF:   Your pain does not improve.  You have a hard time eating food.  Your bowel movements do not return to normal. SEEK IMMEDIATE MEDICAL CARE IF:   Your pain becomes worse.  Your symptoms do not get better.  Your symptoms suddenly get worse.  You have a fever.  You have repeated vomiting.  You have bloody or black, tarry stools. MAKE SURE YOU:   Understand these instructions.  Will watch your condition.  Will get help right away if you are not doing well or get worse.   This information is not intended to replace advice given to you by your health care provider. Make sure you discuss any questions you have with your health care provider.   Document Released: 03/10/2005 Document Revised: 06/05/2013 Document Reviewed: 04/25/2013 Elsevier Interactive Patient Education 2016 Elsevier Inc.   High-Fiber Diet Fiber, also called dietary fiber,  is a type of carbohydrate found in fruits, vegetables, whole grains, and beans. A high-fiber diet can have many health benefits. Your health care provider may recommend a high-fiber diet to help:  Prevent constipation.  Fiber can make your bowel movements more regular.  Lower your cholesterol.  Relieve hemorrhoids, uncomplicated diverticulosis, or irritable bowel syndrome.  Prevent overeating as part of a weight-loss plan.  Prevent heart disease, type 2 diabetes, and certain cancers. WHAT IS MY PLAN? The recommended daily intake of fiber includes:  38 grams for men under age 53.  43 grams for men over age 2.  62 grams for women under age 11.  80 grams for women over age 27. You can get the recommended daily intake of dietary fiber by eating a variety of fruits, vegetables, grains, and beans. Your health care provider may also recommend a fiber supplement if it is not possible to get enough fiber through your diet. WHAT DO I NEED TO KNOW ABOUT A HIGH-FIBER DIET?  Fiber supplements have not been widely studied for their effectiveness, so it is better to get fiber through food sources.  Always check the fiber content on thenutrition facts label of any prepackaged food. Look for foods that contain at least 5 grams of fiber per serving.  Ask your dietitian if you have questions about specific foods that are related to your condition, especially if those foods are not listed in the following section.  Increase your daily fiber consumption gradually. Increasing your intake of dietary fiber too quickly may cause bloating, cramping, or gas.  Drink plenty of water. Water helps you to digest fiber. WHAT FOODS CAN I EAT? Grains Whole-grain breads. Multigrain cereal. Oats and oatmeal. Brown rice. Barley. Bulgur wheat. [place]. Bran muffins. Popcorn. Rye wafer crackers. Vegetables Sweet potatoes. Spinach. Kale. Artichokes. Cabbage. Broccoli. Green peas. Carrots. Squash. Fruits Berries. Pears. Apples. Oranges. Avocados. Prunes and raisins. Dried figs. Meats and Other Protein Sources [place], kidney, pinto, and soy beans. Split peas. Lentils. Nuts and seeds. Dairy Fiber-fortified  yogurt. Beverages Fiber-fortified soy milk. Fiber-fortified orange juice. Other Fiber bars. The items listed above may not be a complete list of recommended foods or beverages. Contact your dietitian for more options. WHAT FOODS ARE NOT RECOMMENDED? Grains White bread. Pasta made with refined flour. White rice. Vegetables Fried potatoes. Canned vegetables. Well-cooked vegetables.  Fruits Fruit juice. Cooked, strained fruit. Meats and Other Protein Sources Fatty cuts of meat. Fried Sales executive or fried fish. Dairy Milk. Yogurt. Cream cheese. Sour cream. Beverages Soft drinks. Other Cakes and pastries. Butter and oils. The items listed above may not be a complete list of foods and beverages to avoid. Contact your dietitian for more information. WHAT ARE SOME TIPS FOR INCLUDING HIGH-FIBER FOODS IN MY DIET?  Eat a wide variety of high-fiber foods.  Make sure that half of all grains consumed each day are whole grains.  Replace breads and cereals made from refined flour or white flour with whole-grain breads and cereals.  Replace white rice with brown rice, bulgur wheat, or millet.  Start the day with a breakfast that is high in fiber, such as a cereal that contains at least 5 grams of fiber per serving.  Use beans in place of meat in soups, salads, or pasta.  Eat high-fiber snacks, such as berries, raw vegetables, nuts, or popcorn.   This information is not intended to replace advice given to you by your health care provider. Make sure you discuss any questions you have with your  health care provider.   Document Released: 05/31/2005 Document Revised: 06/21/2014 Document Reviewed: 11/13/2013 Elsevier Interactive Patient Education [employer].

## 2015-12-18 NOTE — Discharge Summary (Signed)
Physician Discharge Summary  [name] [id number] DOB: 29-May-1921 DOA: 12/16/2015  PCP: Simona Huh, MD  Admit date: 12/16/2015 Discharge date: 12/18/2015  Recommendations for Outpatient Follow-up:  1. Follow up with PCP in 1-2 weeks  Discharge Condition: Stable  Brief/Interim Summary: [name] with history of Dementia, perforated small bowel s/p resection, tremors, stroke who came with cc of abdominal pain, mainly in left abdomen, not associated with nausea, vomiting or diarrhea. No bloody stool or melena or hematemesis. No fever or chills. No other complaints. No confusion. Patient is a poor historian and history was taken from family at bedside.   Assessment & Plan:  1. Acute Diverticulitis - managed with IV zosyn with rapid improvement, home on cipro/flagyl, follow up with PCP.  Probiotics OTC recommended.  2. Hypertension - resuming home medications and remained stable.  3. Cerebrovascular disease s/p CVA - resume home medications, currently has been stable.  4. Dementia - continue home donepezil as ordered.  5. CKD stage 3 -for CrCl ~ 27 ml/min, reduced namenda to 5mg  BID per pharmacy recommendations. 6. Anemia, chronic disease - remained stable.  Discharge Diagnoses:  Active Problems:   Senile dementia, uncomplicated   Abnormality of gait   Diverticulitis   CKD (chronic kidney disease) stage 3, GFR 30-59 ml/min  Discharge Instructions  Discharge Instructions    Increase activity slowly    Complete by:  As directed             Medication List    TAKE these medications        cholecalciferol 1000 units tablet  Commonly known as:  VITAMIN D  Take 2,000 Units by mouth daily.     ciprofloxacin 250 MG tablet  Commonly known as:  CIPRO  Take 1 tablet (250 mg total) by mouth 2 (two) times daily.     dipyridamole-aspirin 200-25 MG 12hr capsule  Commonly known as:  AGGRENOX  Take 1 capsule by mouth 2 (two) times daily.     docusate  sodium 100 MG capsule  Commonly known as:  COLACE  Take 200 mg by mouth at bedtime.     donepezil 23 MG Tabs tablet  Commonly known as:  ARICEPT  Take 1 tablet (23 mg total) by mouth at bedtime.     fexofenadine 180 MG tablet  Commonly known as:  ALLEGRA  Take 180 mg by mouth daily.     fluticasone 50 MCG/ACT nasal spray  Commonly known as:  FLONASE  Place 2 sprays into the nose at bedtime.     memantine 10 MG tablet  Commonly known as:  NAMENDA  Take 1 tablet (10 mg total) by mouth 2 (two) times daily.     metroNIDAZOLE 500 MG tablet  Commonly known as:  FLAGYL  Take 1 tablet (500 mg total) by mouth 2 (two) times daily with a meal. DO NOT CONSUME ALCOHOL WHILE TAKING THIS MEDICATION.     multivitamin-lutein Caps capsule  Take 1 capsule by mouth daily.     multivitamin-iron-minerals-folic acid chewable tablet  Chew 1 tablet by mouth daily.     polyethylene glycol packet  Commonly known as:  MIRALAX / GLYCOLAX  Take 17 g by mouth daily as needed for moderate constipation (If no BM in 24 hours as needed.).     propranolol ER 60 MG 24 hr capsule  Commonly known as:  INDERAL LA  Take 1 capsule (60 mg total) by mouth daily.     vitamin E  400 UNIT capsule  Take 400 Units by mouth as directed.           Follow-up Information    Follow up with Simona Huh, MD. Schedule an appointment as soon as possible for a visit in 1 week.   Specialty:  Family Medicine   Why:  Hospital Follow Up   Contact information:   [address] [place] [address] [phone]       Follow up with SETHI,PRAMOD, MD. Schedule an appointment as soon as possible for a visit in 1 month.   Specialties:  Neurology, Radiology   Why:  As needed   Contact information:   [address] [place]  [address] [phone]      Allergies  Allergen Reactions  . Topamax [Topiramate]      Procedures/Studies: Ct Abdomen Pelvis Wo Contrast  12/16/2015  CLINICAL  DATA:  Sudden onset of lower abdominal pain starting about 1 hour ago. History of perforated bowel. Reports similar pain. EXAM: CT ABDOMEN AND PELVIS WITHOUT CONTRAST TECHNIQUE: Multidetector CT imaging of the abdomen and pelvis was performed following the standard protocol without IV contrast. COMPARISON:  CT abdomen dated 11/30/2014. FINDINGS: Lower chest:  Bibasilar atelectasis. Hepatobiliary: No mass visualized within the liver on this unenhanced exam. Patient is status post cholecystectomy. Pancreas: No mass or inflammatory process identified on this un-enhanced exam. Spleen: Within normal limits in size. Adrenals/Urinary Tract: Bilateral renal cysts, better characterized on earlier CT abdomen with contrast. No renal stone or hydronephrosis seen bilaterally. No ureteral or bladder calculi. Bladder is decompressed. Stomach/Bowel: There is extensive diverticulosis of the sigmoid and lower descending colon. There is thickening of the walls of the lower descending colon and/or upper sigmoid colon, with associated pericolic inflammation, consistent with acute diverticulitis. No dilated large or small bowel loops. Stomach appears normal. Vascular/Lymphatic: Atherosclerotic changes of the normal caliber abdominal aorta. No enlarged lymph nodes seen in the abdomen or pelvis. Reproductive: No mass or other significant abnormality. Other: Trace free fluid in the lower pelvis. No abscess collections seen. No free intraperitoneal air. Musculoskeletal: Surgical changes of a previous anterior abdominal wall hernia repair, with stable diastases superiorly. Stable left inguinal hernia which contains mesenteric fat only. Degenerative changes again noted throughout the slightly scoliotic thoracolumbar spine, at least moderate in degree. No acute or suspicious osseous finding. IMPRESSION: 1. Acute diverticulitis of the lower descending colon and/or upper sigmoid colon with associated bowel wall thickening and paracolic  inflammation, involving approximately 7 cm segment of the colon. No paracolic abscess collection or free intraperitoneal air. No associated bowel obstruction. 2. Extensive diverticulosis of the sigmoid and descending colon. 3. Aortic atherosclerosis. Additional chronic/incidental findings detailed above. Electronically Signed   By: Franki Cabot M.D.   On: 12/16/2015 14:57     Subjective: Pt tolerated diet well.  Abd pain resolved now.    Discharge Exam: Filed Vitals:   12/17/15 2116 12/18/15 0619  BP: 99/36 131/43  Pulse: 51 50  Temp: 98.4 F (36.9 C) 98.2 F (36.8 C)  Resp: 18 17   Filed Vitals:   12/17/15 0558 12/17/15 1531 12/17/15 2116 12/18/15 0619  BP: 111/44 120/47 99/36 131/43  Pulse: 50 50 51 50  Temp: 98.4 F (36.9 C) 98.8 F (37.1 C) 98.4 F (36.9 C) 98.2 F (36.8 C)  TempSrc: Oral Oral Axillary Oral  Resp: 18 18 18 17   Height:      Weight:      SpO2: 95% 98% 96% 98%  General: Pt is alert, awake, not in acute distress Cardiovascular: RRR, S1/S2 +, no rubs, no gallops Respiratory: CTA bilaterally, no wheezing, no rhonchi Abdominal: Soft, NT, ND, bowel sounds + Extremities: no edema, no cyanosis   The results of significant diagnostics from this hospitalization (including imaging, microbiology, ancillary and laboratory) are listed below for reference.     Microbiology: No results found for this or any previous visit (from the past 240 hour(s)).   Labs: BNP (last 3 results) No results for input(s): BNP in the last 8760 hours. Basic Metabolic Panel:  Recent Labs Lab 12/16/15 1335 12/17/15 0454 12/18/15 0448  NA 139 137 139  K 4.1 4.0 3.6  CL 106 106 109  CO2 28 25 26   GLUCOSE 93 112* 94  BUN 25* 24* 17  CREATININE 1.84* 1.93* 1.86*  CALCIUM 9.0 8.1* 8.2*   Liver Function Tests:  Recent Labs Lab 12/16/15 1335 12/17/15 0454  AST 19 28  ALT 14* 16*  ALKPHOS 53 43  BILITOT 1.0 1.8*  PROT 6.7 5.6*  ALBUMIN 3.8 3.1*    Recent Labs Lab  12/16/15 1335  LIPASE 26   No results for input(s): AMMONIA in the last 168 hours. CBC:  Recent Labs Lab 12/16/15 1335 12/17/15 0454  WBC 6.3 7.5  HGB 12.1* 11.0*  HCT 36.4* 33.5*  MCV 97.3 96.8  PLT 193 166   Cardiac Enzymes: No results for input(s): CKTOTAL, CKMB, CKMBINDEX, TROPONINI in the last 168 hours. BNP: Invalid input(s): POCBNP CBG:  Recent Labs Lab 12/17/15 0049  GLUCAP 105*   D-Dimer No results for input(s): DDIMER in the last 72 hours. Hgb A1c No results for input(s): HGBA1C in the last 72 hours. Lipid Profile No results for input(s): CHOL, HDL, LDLCALC, TRIG, CHOLHDL, LDLDIRECT in the last 72 hours. Thyroid function studies No results for input(s): TSH, T4TOTAL, T3FREE, THYROIDAB in the last 72 hours.  Invalid input(s): FREET3 Anemia work up No results for input(s): VITAMINB12, FOLATE, FERRITIN, TIBC, IRON, RETICCTPCT in the last 72 hours. Urinalysis    Component Value Date/Time   COLORURINE YELLOW 11/30/2014 1201   APPEARANCEUR CLEAR 11/30/2014 1201   LABSPEC 1.019 11/30/2014 1201   PHURINE 7.5 11/30/2014 1201   GLUCOSEU NEGATIVE 11/30/2014 1201   HGBUR NEGATIVE 11/30/2014 1201   BILIRUBINUR NEGATIVE 11/30/2014 1201   KETONESUR NEGATIVE 11/30/2014 1201   PROTEINUR NEGATIVE 11/30/2014 1201   UROBILINOGEN 1.0 11/30/2014 1201   NITRITE NEGATIVE 11/30/2014 1201   LEUKOCYTESUR NEGATIVE 11/30/2014 1201   Sepsis Labs Invalid input(s): PROCALCITONIN,  WBC,  LACTICIDVEN Microbiology No results found for this or any previous visit (from the past 240 hour(s)).  Time coordinating discharge: 31 minutes  SIGNED:  Irwin Brakeman, MD  [facility] 12/18/2015, 12:04 PM Pager   If 7PM-7AM, please contact night-coverage [email or web address] Password [email or web address]

## 2015-12-18 NOTE — Evaluation (Signed)
Occupational Therapy Evaluation Patient Details Name: [name] MRN: [id number] DOB: 05-17-1921 Today's Date: 12/18/2015    History of Present Illness [name] with history of Dementia, perforated small bowel s/p resection, tremors, stroke who came with cc of abdominal pain, mainly in left abdomen, not associated with nausea, vomiting or diarrhea   Clinical Impression   Pt was admitted for the above. At baseline, he is mostly set up/supervision for adls.  Will follow in acute setting with goals of reaching his baseline level of care.    Follow Up Recommendations  No OT follow up;Supervision/Assistance - 24 hour    Equipment Recommendations  None recommended by OT    Recommendations for Other Services       Precautions / Restrictions Precautions Precautions: Fall Restrictions Weight Bearing Restrictions: No      Mobility Bed Mobility         Supine to sit: Mod assist;HOB elevated     General bed mobility comments: assist with trunk to upright, incr time  Transfers   Equipment used: Rolling walker (2 wheeled) Transfers: Sit to/from Stand Sit to Stand: Min guard         General transfer comment: for safety    Balance                                            ADL Overall ADL's : Needs assistance/impaired     Grooming: Wash/dry face;Set up;Supervision/safety;Sitting   Upper Body Bathing: Set up;Supervision/ safety;Sitting   Lower Body Bathing: Min guard;Sit to/from stand   Upper Body Dressing : Minimal assistance;Sitting (lines)   Lower Body Dressing: Min guard;Sit to/from stand (extra time)   Toilet Transfer: Min guard;BSC;RW;Ambulation   Toileting- Clothing Manipulation and Hygiene: Maximal assistance;Sit to/from stand         General ADL Comments: pt has tremor; has spoon to compensate for tremor, but he will use this. Also doesn't like lidded cup he has at home.  Wife provides set up and cueing for adls  and assists him in the shower     Vision     Perception     Praxis      Pertinent Vitals/Pain Pain Assessment: No/denies pain     Hand Dominance     Extremity/Trunk Assessment Upper Extremity Assessment Upper Extremity Assessment: Generalized weakness           Communication Communication Communication: No difficulties   Cognition Arousal/Alertness: Awake/alert Behavior During Therapy: WFL for tasks assessed/performed Overall Cognitive Status: History of cognitive impairments - at baseline                     General Comments       Exercises       Shoulder Instructions      Home Living Family/patient expects to be discharged to:: Private residence Living Arrangements: Spouse/significant other Available Help at Discharge: Family;Available 24 hours/day               Bathroom Shower/Tub: Tub/shower unit Shower/tub characteristics: Architectural technologist: Standard     Home Equipment: Grab bars - toilet;Grab bars - tub/shower;Tub bench (5 grab bars in bathroom)          Prior Functioning/Environment Level of Independence: Independent with assistive device(s)             OT Diagnosis:  OT Problem List: Decreased strength;Decreased activity tolerance;Impaired balance (sitting and/or standing);Decreased cognition   OT Treatment/Interventions: Self-care/ADL training;DME and/or AE instruction;Balance training;Patient/family education    OT Goals(Current goals can be found in the care plan section) Acute Rehab OT Goals Patient Stated Goal: none stated OT Goal Formulation: With patient Time For Goal Achievement: 12/25/15 Potential to Achieve Goals: Good ADL Goals Pt Will Transfer to Toilet: with supervision;ambulating;regular height toilet;grab bars Additional ADL Goal #1: pt will perform adl with set up/supervision and cueing sit to stand  OT Frequency: Min 2X/week   Barriers to D/C:            Co-evaluation               End of Session    Activity Tolerance: Patient tolerated treatment well Patient left: in chair;with call bell/phone within reach;with family/visitor present   Time: [id number] OT Time Calculation (min): 18 min Charges:  OT General Charges $OT Visit: 1 Procedure OT Evaluation $OT Eval Moderate Complexity: 1 Procedure G-Codes:    Nyree Applegate 12/20/15, 10:49 AM Lesle Chris, OTR/L [phone] 12/20/2015

## 2015-12-18 NOTE — Progress Notes (Signed)
Date: December 18 2015 No needs present at time of discharge. Velva Harman, RN, BSN, [place]   [phone]

## 2015-12-22 ENCOUNTER — Telehealth: Payer: Self-pay | Admitting: Neurology

## 2015-12-22 NOTE — Telephone Encounter (Signed)
Okay to stay on the current dose till follow-up appointment with me but if there is cognitive worsening to call me

## 2015-12-22 NOTE — Telephone Encounter (Signed)
Rn call patients wife [name] about her husband being in the hospital July 6. Pt was admitted for diverticulosis. Pt is currently on antibiotics since being discharge. Pts wife stated her husband was taking 10mg  namenda bid. Since he was d/c from the hospital it was decrease to 5mg  bid. Rn ask pts wife did she get any explanation on why it was decrease. Pts wife stated the hospital told her something about his kidney functioning but she is not sure. Rn stated a message will be sent to Dr. Leonie Man for clarification on the namenda dosage. Pts wife verbalized understanding.

## 2015-12-22 NOTE — Telephone Encounter (Signed)
Patient's wife [name] is calling and states that during a recent hospital visit her husband's Rx Memantine was decreased to 5 mg 2 x day and Dr. Leonie Man had him taking 10 mg 2 x per day. Please call.

## 2015-12-22 NOTE — Telephone Encounter (Signed)
Rn call patient back about the namenda dose that was increase to 5mg  bid. Rn stated per Dr. Leonie Man note, he is okay with patient staying on current dose. But also if his cognitive gets worse to call the office back. PT is too keep appt with NP in 01/2016.Pts wife verbalized understanding.

## 2016-01-01 DIAGNOSIS — I700 Atherosclerosis of aorta: Secondary | ICD-10-CM | POA: Diagnosis not present

## 2016-01-01 DIAGNOSIS — F0390 Unspecified dementia without behavioral disturbance: Secondary | ICD-10-CM | POA: Diagnosis not present

## 2016-01-01 DIAGNOSIS — K5792 Diverticulitis of intestine, part unspecified, without perforation or abscess without bleeding: Secondary | ICD-10-CM | POA: Diagnosis not present

## 2016-02-05 DIAGNOSIS — I639 Cerebral infarction, unspecified: Secondary | ICD-10-CM | POA: Diagnosis not present

## 2016-02-05 DIAGNOSIS — N183 Chronic kidney disease, stage 3 (moderate): Secondary | ICD-10-CM | POA: Diagnosis not present

## 2016-02-05 DIAGNOSIS — I700 Atherosclerosis of aorta: Secondary | ICD-10-CM | POA: Diagnosis not present

## 2016-02-05 DIAGNOSIS — R413 Other amnesia: Secondary | ICD-10-CM | POA: Diagnosis not present

## 2016-02-05 DIAGNOSIS — E559 Vitamin D deficiency, unspecified: Secondary | ICD-10-CM | POA: Diagnosis not present

## 2016-02-05 DIAGNOSIS — D692 Other nonthrombocytopenic purpura: Secondary | ICD-10-CM | POA: Diagnosis not present

## 2016-02-05 DIAGNOSIS — J309 Allergic rhinitis, unspecified: Secondary | ICD-10-CM | POA: Diagnosis not present

## 2016-02-05 DIAGNOSIS — G250 Essential tremor: Secondary | ICD-10-CM | POA: Diagnosis not present

## 2016-02-05 DIAGNOSIS — Z23 Encounter for immunization: Secondary | ICD-10-CM | POA: Diagnosis not present

## 2016-02-05 DIAGNOSIS — H9190 Unspecified hearing loss, unspecified ear: Secondary | ICD-10-CM | POA: Diagnosis not present

## 2016-02-05 DIAGNOSIS — Z1389 Encounter for screening for other disorder: Secondary | ICD-10-CM | POA: Diagnosis not present

## 2016-02-09 ENCOUNTER — Ambulatory Visit (INDEPENDENT_AMBULATORY_CARE_PROVIDER_SITE_OTHER): Payer: Medicare Other | Admitting: Nurse Practitioner

## 2016-02-09 ENCOUNTER — Encounter: Payer: Self-pay | Admitting: Nurse Practitioner

## 2016-02-09 VITALS — BP 129/67 | HR 56 | Ht 72.0 in | Wt 189.0 lb

## 2016-02-09 DIAGNOSIS — F0390 Unspecified dementia without behavioral disturbance: Secondary | ICD-10-CM | POA: Diagnosis not present

## 2016-02-09 DIAGNOSIS — R413 Other amnesia: Secondary | ICD-10-CM

## 2016-02-09 DIAGNOSIS — R269 Unspecified abnormalities of gait and mobility: Secondary | ICD-10-CM

## 2016-02-09 DIAGNOSIS — G250 Essential tremor: Secondary | ICD-10-CM

## 2016-02-09 MED ORDER — DONEPEZIL HCL 23 MG PO TABS: 23.0000 mg | ORAL_TABLET | Freq: Every day | ORAL | 2 refills | Status: DC

## 2016-02-09 MED ORDER — MEMANTINE HCL 5 MG PO TABS: 10.0000 mg | ORAL_TABLET | Freq: Two times a day (BID) | ORAL | 2 refills | Status: DC

## 2016-02-09 NOTE — Progress Notes (Signed)
[facility]  PATIENT: [name] DOB: [birth year]   REASON FOR VISIT: Follow-up for memory loss HISTORY FROM: Patient and wife and daughter [name]    HISTORY OF PRESENT ILLNESS:82 year old Caucasian male with long standing benign tremors which have worsened recently, mild senile dementia, remote left [place] brain stroke in 2006 and multifactorial gait difficulty.  Update 08/29/2014 PS: He returns for follow-up after last visit 6 months ago. He has been able to tolerate Aricept 23 mg daily without significant GI or CNS side effects. However continues to have significant memory problems, difficulty understanding conversations or while watching TV shows. His sentences are short and he will try only spoken to. He will seldom initiate conversation. He also has stuffed time understanding recent information on learning new activities. Patient has been tried on Namenda in the past but did not try it due to poor short-term memory. Patient's family is to find out from [facility] hospice*the current procedure for him being referred there. He has not started using a walker most of the time for added security and balance problems Update 8/30/2016PS : He returns for follow-up after last visit 6 months ago. Is accompanied by his family. The patient had to discontinue cervical as it made him too sleepy. He is more alert now and has only occasional hallucination or delusions. He has tolerated Namenda now and is currently on 10 mg twice daily. Patient walks little but with a walker and has had no falls. He in fact had abdominal surgery for perforated bowel and June 18 and has recovered very well. He spent a few weeks in rehabilitation and is now at home. He continues to have mild intermittent tremor and is tolerating Inderal well without any side effects. Update 08/12/15 PS; he returns for follow-up after last visit 6 months ago. Is accompanied by wife and daughter. He feels he may have  cognitively declined slightly. His short-term memory is poor and needs constant repetition and reminders. He however still manages to meet his activities of daily living by himself. He walks with a walker and short distances he can hold onto walls. He's had no recent falls. There has been no behavioral issues with agitation, delusions or hallucinations. Occasionally can get disoriented and confused. He is tolerating Aricept and Namenda well without problems. He has occasional hand tremors but he takes Inderal which seems to help him. There have been no new health problems. He does have some swelling in his leg. UPDATE 08/28/2017CM Mr. Spielberg, 82 year old male returns for follow-up. He has a long history of memory loss and is currently on Aricept 23 mg daily along with Namenda 10 mg twice daily. He had a hospital admission for diverticulitis in July. He is  back home. He and his wife feel his memory is stable. He is independent with his activities of daily living. He has not driven in several years. There have been no behavior issues. He returns for reevaluation  REVIEW OF SYSTEMS: Full 14 system review of systems performed and notable only for those listed, all others are neg:  Constitutional: neg  Cardiovascular: neg Ear/Nose/Throat: neg  Skin: neg Eyes: neg Respiratory: neg Gastroitestinal: neg  Hematology/Lymphatic: neg  Endocrine: neg Musculoskeletal: Walking difficulty Allergy/Immunology: neg Neurological: Tremors, memory loss Psychiatric: neg Sleep : neg   ALLERGIES: Allergies  Allergen Reactions  . Topamax [Topiramate]     HOME MEDICATIONS: Outpatient Medications Prior to Visit  Medication Sig Dispense Refill  . cholecalciferol (VITAMIN D) 1000 UNITS tablet Take  2,000 Units by mouth daily.    Marland Kitchen dipyridamole-aspirin (AGGRENOX) 200-25 MG per 12 hr capsule Take 1 capsule by mouth 2 (two) times daily.    Marland Kitchen docusate sodium (COLACE) 100 MG capsule Take 200 mg by mouth at bedtime.       . donepezil (ARICEPT) 23 MG TABS tablet Take 1 tablet (23 mg total) by mouth at bedtime. 90 tablet 0  . fexofenadine (ALLEGRA) 180 MG tablet Take 180 mg by mouth daily.    . fluticasone (FLONASE) 50 MCG/ACT nasal spray Place 2 sprays into the nose at bedtime.    . memantine (NAMENDA) 5 MG tablet Take 1 tablet (5 mg total) by mouth 2 (two) times daily. 60 tablet 0  . metroNIDAZOLE (FLAGYL) 500 MG tablet Take 1 tablet (500 mg total) by mouth 2 (two) times daily with a meal. DO NOT CONSUME ALCOHOL WHILE TAKING THIS MEDICATION. 14 tablet 0  . multivitamin-iron-minerals-folic acid (CENTRUM) chewable tablet Chew 1 tablet by mouth daily.    . multivitamin-lutein (OCUVITE-LUTEIN) CAPS capsule Take 1 capsule by mouth daily.    . polyethylene glycol (MIRALAX / GLYCOLAX) packet Take 17 g by mouth daily as needed for moderate constipation (If no BM in 24 hours as needed.). (Patient not taking: Reported on 12/16/2015) 14 each 0  . propranolol ER (INDERAL LA) 60 MG 24 hr capsule Take 1 capsule (60 mg total) by mouth daily.    . vitamin E 400 UNIT capsule Take 400 Units by mouth as directed.     No facility-administered medications prior to visit.     PAST MEDICAL HISTORY: Past Medical History:  Diagnosis Date  . Arthritis   . CVA (cerebral infarction)   . ED (erectile dysfunction)   . Essential tremor   . Memory loss   . Paroxysmal atrial fibrillation (HCC)   . Sepsis ([place])   . Stroke ([place])   . Ventral hernia     PAST SURGICAL HISTORY: Past Surgical History:  Procedure Laterality Date  . BOWEL RESECTION N/A 11/30/2014   Procedure: SMALL BOWEL RESECTION;  Surgeon: Alphonsa Overall, MD;  Location: [facility];  Service: General;  Laterality: N/A;  . COLON SURGERY    . GALLBLADDER SURGERY    . HERNIA REPAIR    . LAPAROSCOPIC LYSIS OF ADHESIONS N/A 11/30/2014   Procedure: LAPAROSCOPIC LYSIS OF ADHESIONS;  Surgeon: Alphonsa Overall, MD;  Location: [facility];  Service: General;  Laterality: N/A;  . perforated intes       FAMILY HISTORY: Family History  Problem Relation Age of Onset  . Aneurysm Mother     stomach  . Dementia Father   . Stroke Father     SOCIAL HISTORY: Social History   Social History  . Marital status: Married    Spouse name: [name]  . Number of children: 2  . Years of education: 10th   Occupational History  .  Retired   Social History Main Topics  . Smoking status: Former Smoker    Types: Cigarettes  . Smokeless tobacco: Never Used  . Alcohol use No  . Drug use: No  . Sexual activity: Not on file   Other Topics Concern  . Not on file   Social History Narrative   Patient lives at home with spouse.   Caffeine Use: 1 cup daily     PHYSICAL EXAM  Vitals:   02/09/16 1510  BP: 129/67  Pulse: (!) 56  Weight: 189 lb (85.7 kg)  Height: 6' (1.829 m)  Body mass index is 25.63 kg/m. Generalized: In no acute distress, pleasant elderly Caucasian male  Neck: Supple, no carotid bruits  Cardiac: Regular rate rhythm, no murmur,  Skin LE edema 1+ Musculoskeletal: mild kyphoscoliosis, stooped posture   Neurological examination  Mentation: Alert oriented to person, time (not date or year). Speech and language appear normal and fluent. MMSE 13/30 same as last visit. with deficits in orientation  0/3 recall, unable to write a sentence or copy a figure .  Animal Fluency Testing 4  Clock drawing 2/4.    Marland Kitchen     Cranial nerve II-XII: Pupils were equal round reactive to light extraocular movements were full, visual field were full on confrontational test. facial sensation and strength were normal. hearing is decreased bilaterally. Uvula tongue midline. head turning and shoulder shrug and were normal and symmetric.Tongue protrusion into cheek strength was normal.  MOTOR: normal bulk and tone, full strength in the BUE, BLE, fine finger movements normal, no pronator drift. Mild action tremor of outstretched upper extremities. Absent at rest. No cogwheel rigidity.  SENSORY: normal  and symmetric to light touch COORDINATION: finger-nose-finger, heel-to-shin bilaterally, there was no truncal ataxia  REFLEXES: Brachioradialis 1/1, biceps 1/1, triceps 1/1, patellar 1/1, Achilles 1/1, plantar responses were flexor bilaterally.   GAIT/STATION: Steady but slow cautious gait with diminished bilateral arm movement and uses a walker DIAGNOSTIC DATA (LABS, IMAGING, TESTING) - I reviewed patient records, labs, notes, testing and imaging myself where available.  Lab Results  Component Value Date   WBC 7.5 12/17/2015   HGB 11.0 (L) 12/17/2015   HCT 33.5 (L) 12/17/2015   MCV 96.8 12/17/2015   PLT 166 12/17/2015      Component Value Date/Time   NA 139 12/18/2015 0448   K 3.6 12/18/2015 0448   CL 109 12/18/2015 0448   CO2 26 12/18/2015 0448   GLUCOSE 94 12/18/2015 0448   BUN 17 12/18/2015 0448   CREATININE 1.86 (H) 12/18/2015 0448   CALCIUM 8.2 (L) 12/18/2015 0448   PROT 5.6 (L) 12/17/2015 0454   ALBUMIN 3.1 (L) 12/17/2015 0454   AST 28 12/17/2015 0454   ALT 16 (L) 12/17/2015 0454   ALKPHOS 43 12/17/2015 0454   BILITOT 1.8 (H) 12/17/2015 0454   GFRNONAA 30 (L) 12/18/2015 0448   GFRAA 35 (L) 12/18/2015 0448    ASSESSMENT AND PLAN 82 year old Caucasian male with long standing history of benign essential tremors, moderate dementia, remote left [place] brain stroke in 2006 and multi-factorial gait difficulty.  Essential tremor on Inderal LA 60 mg daily. MMSE 13/30 on Aricept 23 mg and Namenda 10 mg twice daily. Memory  is stable from last visit.  No recurrent neurovascular symptoms.   PLAN: Continue Aricept at current dose will refill, Continue Namenda 10 mg twice daily will refill mail-order Use walker at all times for safe ambulation at risk for falls Follow-up in 6 months Dennie Bible, [facility], [facility], APRN  [facility] [address], [place], [place] [address] [phone]

## 2016-02-09 NOTE — Patient Instructions (Signed)
Continue Aricept at current dose will refill, Continue Namenda 10 mg twice daily will refill mail-order Use walker at all times for safe ambulation at risk for falls Follow-up in 6 months

## 2016-02-12 ENCOUNTER — Telehealth: Payer: Self-pay | Admitting: *Deleted

## 2016-02-12 MED ORDER — MEMANTINE HCL 10 MG PO TABS: 10.0000 mg | ORAL_TABLET | Freq: Two times a day (BID) | ORAL | 3 refills | Status: AC

## 2016-02-12 NOTE — Telephone Encounter (Signed)
[facility] pharmacy request questioning new prescription received.  Pt is taking 10mg  po bid.   (was in [facility] recently for diverticulits and this dose was changed to 5mg  po bid).  Was seen in our office and dose changed to  10mg  po bid.  I spoke to wife and she would like to have the 10mg  tablets.  Has 4 months worth of medication.  (this includes 45mo of 10mg  tabs and 2 months of 5 mg tabs.  She is aware of the difference and will be giving to her husband.  I told her I would call the [employer] and let them know. I called and spoke to [place], pharmacist relayed that pt is taking 10mg  po bid.  Took new prescription of namenda 10mg  po bid #[phone] 3 refills.

## 2016-02-12 NOTE — Progress Notes (Signed)
I agree with the above plan 

## 2016-03-05 DIAGNOSIS — R040 Epistaxis: Secondary | ICD-10-CM | POA: Diagnosis not present

## 2016-03-16 ENCOUNTER — Encounter (HOSPITAL_COMMUNITY): Payer: Self-pay | Admitting: Emergency Medicine

## 2016-03-16 ENCOUNTER — Emergency Department (HOSPITAL_COMMUNITY): Payer: Medicare Other

## 2016-03-16 ENCOUNTER — Emergency Department (HOSPITAL_COMMUNITY)
Admission: EM | Admit: 2016-03-16 | Discharge: 2016-03-16 | Disposition: A | Discharge status: Home / Self Care | Payer: Medicare Other | Attending: Emergency Medicine | Admitting: Emergency Medicine

## 2016-03-16 DIAGNOSIS — R410 Disorientation, unspecified: Secondary | ICD-10-CM | POA: Insufficient documentation

## 2016-03-16 DIAGNOSIS — N183 Chronic kidney disease, stage 3 (moderate): Secondary | ICD-10-CM | POA: Insufficient documentation

## 2016-03-16 DIAGNOSIS — Z87891 Personal history of nicotine dependence: Secondary | ICD-10-CM | POA: Insufficient documentation

## 2016-03-16 DIAGNOSIS — E860 Dehydration: Secondary | ICD-10-CM

## 2016-03-16 DIAGNOSIS — Z8673 Personal history of transient ischemic attack (TIA), and cerebral infarction without residual deficits: Secondary | ICD-10-CM | POA: Diagnosis not present

## 2016-03-16 DIAGNOSIS — R4182 Altered mental status, unspecified: Secondary | ICD-10-CM | POA: Diagnosis present

## 2016-03-16 DIAGNOSIS — F4489 Other dissociative and conversion disorders: Secondary | ICD-10-CM | POA: Diagnosis not present

## 2016-03-16 DIAGNOSIS — I6789 Other cerebrovascular disease: Secondary | ICD-10-CM | POA: Diagnosis not present

## 2016-03-16 LAB — COMPREHENSIVE METABOLIC PANEL
ALBUMIN: 3.5 g/dL (ref 3.5–5.0)
ALK PHOS: 54 U/L (ref 38–126)
ALT: 12 U/L — ABNORMAL LOW (ref 17–63)
ANION GAP: 7 (ref 5–15)
AST: 19 U/L (ref 15–41)
BUN: 21 mg/dL — ABNORMAL HIGH (ref 6–20)
CALCIUM: 9.2 mg/dL (ref 8.9–10.3)
CO2: 26 mmol/L (ref 22–32)
Chloride: 109 mmol/L (ref 101–111)
Creatinine, Ser: 1.79 mg/dL — ABNORMAL HIGH (ref 0.61–1.24)
GFR calc non Af Amer: 31 mL/min — ABNORMAL LOW (ref >60)
GFR, EST AFRICAN AMERICAN: 36 mL/min — AB (ref >60)
Glucose, Bld: 87 mg/dL (ref 65–99)
POTASSIUM: 4.3 mmol/L (ref 3.5–5.1)
SODIUM: 142 mmol/L (ref 135–145)
Total Bilirubin: 1.1 mg/dL (ref 0.3–1.2)
Total Protein: 6.5 g/dL (ref 6.5–8.1)

## 2016-03-16 LAB — CBC WITH DIFFERENTIAL/PLATELET
BASOS PCT: 1 %
Basophils Absolute: 0 10*3/uL (ref 0.0–0.1)
Eosinophils Absolute: 0.2 10*3/uL (ref 0.0–0.7)
Eosinophils Relative: 3 %
HEMATOCRIT: 39.7 % (ref 39.0–52.0)
Hemoglobin: 12.6 g/dL — ABNORMAL LOW (ref 13.0–17.0)
Lymphocytes Relative: 35 %
Lymphs Abs: 2.7 10*3/uL (ref 0.7–4.0)
MCH: 31.2 pg (ref 26.0–34.0)
MCHC: 31.7 g/dL (ref 30.0–36.0)
MCV: 98.3 fL (ref 78.0–100.0)
MONO ABS: 0.6 10*3/uL (ref 0.1–1.0)
MONOS PCT: 7 %
NEUTROS ABS: 4.3 10*3/uL (ref 1.7–7.7)
Neutrophils Relative %: 54 %
Platelets: 198 10*3/uL (ref 150–400)
RBC: 4.04 MIL/uL — ABNORMAL LOW (ref 4.22–5.81)
RDW: 12.5 % (ref 11.5–15.5)
WBC: 7.9 10*3/uL (ref 4.0–10.5)

## 2016-03-16 LAB — URINALYSIS, ROUTINE W REFLEX MICROSCOPIC
Bilirubin Urine: NEGATIVE
Glucose, UA: NEGATIVE mg/dL
Hgb urine dipstick: NEGATIVE
KETONES UR: NEGATIVE mg/dL
Leukocytes, UA: NEGATIVE
NITRITE: NEGATIVE
PH: 6.5 (ref 5.0–8.0)
PROTEIN: NEGATIVE mg/dL
Specific Gravity, Urine: 1.02 (ref 1.005–1.030)

## 2016-03-16 LAB — I-STAT TROPONIN, ED: Troponin i, poc: 0 ng/mL (ref 0.00–0.08)

## 2016-03-16 MED ORDER — SODIUM CHLORIDE 0.9 % IV BOLUS (SEPSIS)
500.0000 mL | Freq: Once | INTRAVENOUS | Status: AC
  Administered 2016-03-16: 500 mL via INTRAVENOUS

## 2016-03-16 NOTE — ED Triage Notes (Signed)
Patient arrived via [facility] from home. Wife reports to [facility] that patient awoken at 08:30 this morning and appeared to be "more confused than normal" and  " was playing with his neck and head" and 'couldn't do stuff he normally could do",  Patient has dementia at base line, history of stroke with right sided weakness. EMS Vitals.. 156/32, HR46, 96% on room air, CBG 112. Family is present at bedside and will act as historians.

## 2016-03-16 NOTE — ED Provider Notes (Signed)
[facility] DEPT Provider Note   CSN: [id number] Arrival date & time: 03/16/16  1030     History   Chief Complaint Chief Complaint  Patient presents with  . Altered Mental Status    HPI SIGIFREDO LOCICERO is a 82 y.o. male.  Patient is a 82 year old gentleman with a history of dementia, axis: Atrial fibrillation, prior CVA 10 years ago causing mild weakness to the right side, chronic kidney disease presenting today with the complaint of not acting himself. Wife states when he got up this morning he grabbed his head and neck and seemed to be uncomfortable but then seemed to be confused throughout the morning he tried to wipe his face off with a Kleenex and put shaving cream on his hand without taking the lid off which he never does. Also he refused to try to walk and would not get out of bed which is unusual. Wife has noted over the last 2 days she's had difficulty getting him to drink water and he's had decreased urine output. No vomiting, diarrhea, fever or new cough. He has recently changed from Aggrenox to Plavix 3 weeks ago and wife feels that since that time he's been slightly more confused not moving as quickly as he normally would and mentally more slowed. He denies any falls or head trauma. She has not noticed any swelling in his lower extremities. At this time patient has no complaints      Past Medical History:  Diagnosis Date  . Arthritis   . CVA (cerebral infarction)   . ED (erectile dysfunction)   . Essential tremor   . Memory loss   . Paroxysmal atrial fibrillation (HCC)   . Sepsis ([place])   . Stroke ([place])   . Ventral hernia     Patient Active Problem List   Diagnosis Date Noted  . CKD (chronic kidney disease) stage 3, GFR 30-59 ml/min 12/18/2015  . Diverticulitis 12/16/2015  . Bowel perforation ([place]) 11/30/2014  . ARF (acute renal failure) ([place]) 11/30/2014  . Small bowel perforation ([place]) 11/30/2014  . Dementia, senile with delusions 01/09/2013  . Other sleep  disturbances 07/18/2012  . Memory loss 07/18/2012  . Unspecified late effects of cerebrovascular disease 07/18/2012  . Senile dementia, uncomplicated [id number]  . Abnormality of gait 07/18/2012  . Tremor, essential 07/18/2012    Past Surgical History:  Procedure Laterality Date  . BACK SURGERY    . BOWEL RESECTION N/A 11/30/2014   Procedure: SMALL BOWEL RESECTION;  Surgeon: Alphonsa Overall, MD;  Location: [facility] ORS;  Service: General;  Laterality: N/A;  . COLON SURGERY    . GALLBLADDER SURGERY    . HERNIA REPAIR    . LAPAROSCOPIC LYSIS OF ADHESIONS N/A 11/30/2014   Procedure: LAPAROSCOPIC LYSIS OF ADHESIONS;  Surgeon: Alphonsa Overall, MD;  Location: [facility];  Service: General;  Laterality: N/A;  . perforated intes         Home Medications    Prior to Admission medications   Medication Sig Start Date End Date Taking? Authorizing Provider  cholecalciferol (VITAMIN D) 1000 UNITS tablet Take 2,000 Units by mouth daily.    Historical Provider, MD  dipyridamole-aspirin (AGGRENOX) 200-25 MG per 12 hr capsule Take 1 capsule by mouth 2 (two) times daily.    Historical Provider, MD  docusate sodium (COLACE) 100 MG capsule Take 200 mg by mouth at bedtime.     Historical Provider, MD  donepezil (ARICEPT) 23 MG TABS tablet Take 1 tablet (23 mg total) by mouth at  bedtime. 02/09/16   Dennie Bible, NP  fexofenadine (ALLEGRA) 180 MG tablet Take 180 mg by mouth daily.    Historical Provider, MD  fluticasone (FLONASE) 50 MCG/ACT nasal spray Place 2 sprays into the nose at bedtime. 01/01/13   Historical Provider, MD  memantine (NAMENDA) 10 MG tablet Take 1 tablet (10 mg total) by mouth 2 (two) times daily. 02/12/16   Dennie Bible, NP  metroNIDAZOLE (FLAGYL) 500 MG tablet Take 1 tablet (500 mg total) by mouth 2 (two) times daily with a meal. DO NOT CONSUME ALCOHOL WHILE TAKING THIS MEDICATION. 12/18/15   Clanford Marisa Hua, MD  multivitamin-iron-minerals-folic acid (CENTRUM) chewable tablet Chew 1  tablet by mouth daily.    Historical Provider, MD  multivitamin-lutein (OCUVITE-LUTEIN) CAPS capsule Take 1 capsule by mouth daily.    Historical Provider, MD  polyethylene glycol (MIRALAX / GLYCOLAX) packet Take 17 g by mouth daily as needed for moderate constipation (If no BM in 24 hours as needed.). Patient not taking: Reported on 12/16/2015 12/10/14   Earnstine Regal, PA-C  propranolol ER (INDERAL LA) 60 MG 24 hr capsule Take 1 capsule (60 mg total) by mouth daily. 02/19/13   Philmore Pali, NP  vitamin E 400 UNIT capsule Take 400 Units by mouth as directed.    Historical Provider, MD    Family History Family History  Problem Relation Age of Onset  . Aneurysm Mother     stomach  . Dementia Father   . Stroke Father     Social History Social History  Substance Use Topics  . Smoking status: Former Smoker    Types: Cigarettes  . Smokeless tobacco: Never Used  . Alcohol use No     Allergies   Topamax [topiramate]   Review of Systems Review of Systems  All other systems reviewed and are negative.    Physical Exam Updated Vital Signs BP 164/65 (BP Location: Right Arm)   Pulse (!) 48   Temp 98 F (36.7 C) (Oral)   Resp 15   SpO2 96%   Physical Exam  Constitutional: He appears well-developed and well-nourished. No distress.  HENT:  Head: Normocephalic and atraumatic.  Mouth/Throat: Oropharynx is clear and moist.  Eyes: Conjunctivae and EOM are normal. Pupils are equal, round, and reactive to light.  Neck: Normal range of motion. Neck supple.  Cardiovascular: Regular rhythm and intact distal pulses.  Bradycardia present.   No murmur heard. Pulmonary/Chest: Effort normal and breath sounds normal. No respiratory distress. He has no wheezes. He has no rales.  Abdominal: Soft. He exhibits no distension. There is no tenderness. There is no rebound and no guarding.  Musculoskeletal: Normal range of motion. He exhibits no edema or tenderness.  Neurological: He is alert. He has  normal strength. No sensory deficit. He displays a negative Romberg sign.  Oriented to person and place but unable to recall his birth date, the president or the year. Family states this is baseline.  No pronator drift  Skin: Skin is warm and dry. No rash noted. No erythema.  Psychiatric: He has a normal mood and affect. His behavior is normal.  Nursing note and vitals reviewed.    ED Treatments / Results  Labs (all labs ordered are listed, but only abnormal results are displayed) Labs Reviewed  CBC WITH DIFFERENTIAL/PLATELET - Abnormal; Notable for the following:       Result Value   RBC 4.04 (*)    Hemoglobin 12.6 (*)    All other components  within normal limits  COMPREHENSIVE METABOLIC PANEL - Abnormal; Notable for the following:    BUN 21 (*)    Creatinine, Ser 1.79 (*)    ALT 12 (*)    GFR calc non Af Amer 31 (*)    GFR calc Af Amer 36 (*)    All other components within normal limits  URINALYSIS, ROUTINE W REFLEX MICROSCOPIC (NOT AT [facility])  I-STAT TROPOININ, ED    EKG  EKG Interpretation  Date/Time:  Tuesday March 16 2016 10:35:51 EDT Ventricular Rate:  47 PR Interval:    QRS Duration: 100 QT Interval:  489 QTC Calculation: 433 R Axis:   -72 Text Interpretation:  Sinus bradycardia Prolonged PR interval Left anterior fascicular block Low voltage, precordial leads Consider right ventricular hypertrophy No significant change since last tracing Confirmed by Maryan Rued  MD, Loree Fee ([id number]) on 03/16/2016 11:02:10 AM       Radiology Dg Chest 2 View  Result Date: 03/16/2016 CLINICAL DATA:  Altered mental status. EXAM: CHEST  2 VIEW COMPARISON:  03/10/2008 FINDINGS: Mild cardiomegaly. There is hyperinflation of the lungs compatible with COPD. No confluent airspace opacities or effusions. No acute bony abnormality. IMPRESSION: Cardiomegaly, COPD.  No active disease. Electronically Signed   By: Rolm Baptise M.D.   On: 03/16/2016 11:53   Ct Head Wo Contrast  Result Date:  03/16/2016 CLINICAL DATA:  Altered mental status. EXAM: CT HEAD WITHOUT CONTRAST TECHNIQUE: Contiguous axial images were obtained from the base of the skull through the vertex without intravenous contrast. COMPARISON:  01/15/2009 FINDINGS: Brain: There is atrophy and chronic small vessel disease changes. No acute intracranial abnormality. Specifically, no hemorrhage, hydrocephalus, mass lesion, acute infarction, or significant intracranial injury. Vascular: No hyperdense vessel or unexpected calcification. Skull: No acute calvarial abnormality. Sinuses/Orbits: Complete opacification of the paranasal sinuses with periosteal thickening compatible with long-standing extensive chronic sinusitis. Findings are stable since prior study. Orbital soft tissues unremarkable. Mastoid air cells are clear. Other: None IMPRESSION: No acute intracranial abnormality. Atrophy, chronic microvascular disease. Chronic pansinusitis, stable. Electronically Signed   By: Rolm Baptise M.D.   On: 03/16/2016 11:41    Procedures Procedures (including critical care time)  Medications Ordered in ED Medications  sodium chloride 0.9 % bolus 500 mL (500 mLs Intravenous New Bag/Given 03/16/16 1114)     Initial Impression / Assessment and Plan / ED Course  I have reviewed the triage vital signs and the nursing notes.  Pertinent labs & imaging results that were available during my care of the patient were reviewed by me and considered in my medical decision making (see chart for details).  Clinical Course   Patient is an elderly male presenting today with confusion and change in baseline that started since waking up this morning. Wife is also noted decreased oral intake and decreased urine output. He refused to get out of bed today so unclear if he was ataxic but he typically walks independently with a walker. He's had no fever, nausea or vomiting. Recently changed from Aggrenox to Plavix but has had no falls, head trauma. No signs of  fluid overload on exam. Patient has no focal neurologic findings on exam however does have a history of dementia and cannot recall the year his birthdate or the president. Patient has no slurred speech or facial droop. He has no pronator drift. Concern that patient's symptoms may be related to delirium possibly an underlying infection with a UTI versus dehydration as he has had decreased by mouth intake. Initially patient  did complain of a headache and some neck discomfort but has no complaints at this time and has equal pulses bilaterally and no reproducible pain.  EKG was sinus bradycardia but patient took his beta blocker this morning. Denies any chest pain or shortness of breath.  Chest x-ray, head CT, CBC, BMP, UA pending. Patient given 500 mL bolus.  1:36 PM Labs wnl. Pt feels better after fluids.  Pt was able to get up and walk with his walker per wife.  Final Clinical Impressions(s) / ED Diagnoses   Final diagnoses:  Confusion  Dehydration    New Prescriptions New Prescriptions   No medications on file     Blanchie Dessert, MD 03/16/16 1341

## 2016-03-16 NOTE — ED Notes (Signed)
MD at bedside. 

## 2016-03-16 NOTE — ED Notes (Signed)
Myself and Chrislyn, RN undressed patient, placed in gown, on monitor, continuous pulse oximetry and blood pressure cuff

## 2016-03-16 NOTE — ED Notes (Signed)
Pt going to CT.  Will obtain urine when pt returns.

## 2016-04-21 ENCOUNTER — Emergency Department (HOSPITAL_COMMUNITY): Payer: Medicare Other

## 2016-04-21 ENCOUNTER — Encounter (HOSPITAL_COMMUNITY): Payer: Self-pay | Admitting: Emergency Medicine

## 2016-04-21 ENCOUNTER — Emergency Department (HOSPITAL_COMMUNITY)
Admission: EM | Admit: 2016-04-21 | Discharge: 2016-04-21 | Disposition: A | Discharge status: Home / Self Care | Payer: Medicare Other | Attending: Emergency Medicine | Admitting: Emergency Medicine

## 2016-04-21 DIAGNOSIS — Z8673 Personal history of transient ischemic attack (TIA), and cerebral infarction without residual deficits: Secondary | ICD-10-CM | POA: Diagnosis not present

## 2016-04-21 DIAGNOSIS — N183 Chronic kidney disease, stage 3 (moderate): Secondary | ICD-10-CM | POA: Insufficient documentation

## 2016-04-21 DIAGNOSIS — Z87891 Personal history of nicotine dependence: Secondary | ICD-10-CM | POA: Diagnosis not present

## 2016-04-21 DIAGNOSIS — K5900 Constipation, unspecified: Secondary | ICD-10-CM

## 2016-04-21 DIAGNOSIS — Z79899 Other long term (current) drug therapy: Secondary | ICD-10-CM | POA: Diagnosis not present

## 2016-04-21 DIAGNOSIS — R109 Unspecified abdominal pain: Secondary | ICD-10-CM

## 2016-04-21 DIAGNOSIS — K5790 Diverticulosis of intestine, part unspecified, without perforation or abscess without bleeding: Secondary | ICD-10-CM | POA: Diagnosis not present

## 2016-04-21 DIAGNOSIS — R1013 Epigastric pain: Secondary | ICD-10-CM | POA: Diagnosis not present

## 2016-04-21 DIAGNOSIS — K2970 Gastritis, unspecified, without bleeding: Secondary | ICD-10-CM | POA: Diagnosis not present

## 2016-04-21 DIAGNOSIS — R10819 Abdominal tenderness, unspecified site: Secondary | ICD-10-CM | POA: Diagnosis not present

## 2016-04-21 LAB — URINALYSIS, ROUTINE W REFLEX MICROSCOPIC
Bilirubin Urine: NEGATIVE
Glucose, UA: NEGATIVE mg/dL
Hgb urine dipstick: NEGATIVE
KETONES UR: NEGATIVE mg/dL
LEUKOCYTES UA: NEGATIVE
NITRITE: NEGATIVE
PROTEIN: 30 mg/dL — AB
Specific Gravity, Urine: 1.018 (ref 1.005–1.030)
pH: 7 (ref 5.0–8.0)

## 2016-04-21 LAB — COMPREHENSIVE METABOLIC PANEL
ALBUMIN: 3.8 g/dL (ref 3.5–5.0)
ALT: 14 U/L — ABNORMAL LOW (ref 17–63)
ANION GAP: 4 — AB (ref 5–15)
AST: 22 U/L (ref 15–41)
Alkaline Phosphatase: 63 U/L (ref 38–126)
BILIRUBIN TOTAL: 1.3 mg/dL — AB (ref 0.3–1.2)
BUN: 24 mg/dL — ABNORMAL HIGH (ref 6–20)
CHLORIDE: 106 mmol/L (ref 101–111)
CO2: 28 mmol/L (ref 22–32)
Calcium: 8.9 mg/dL (ref 8.9–10.3)
Creatinine, Ser: 1.81 mg/dL — ABNORMAL HIGH (ref 0.61–1.24)
GFR calc Af Amer: 35 mL/min — ABNORMAL LOW (ref >60)
GFR, EST NON AFRICAN AMERICAN: 31 mL/min — AB (ref >60)
GLUCOSE: 114 mg/dL — AB (ref 65–99)
POTASSIUM: 4.3 mmol/L (ref 3.5–5.1)
Sodium: 138 mmol/L (ref 135–145)
TOTAL PROTEIN: 6.6 g/dL (ref 6.5–8.1)

## 2016-04-21 LAB — CBC WITH DIFFERENTIAL/PLATELET
BASOS ABS: 0 10*3/uL (ref 0.0–0.1)
BASOS PCT: 1 %
EOS PCT: 3 %
Eosinophils Absolute: 0.2 10*3/uL (ref 0.0–0.7)
HEMATOCRIT: 36.2 % — AB (ref 39.0–52.0)
Hemoglobin: 11.9 g/dL — ABNORMAL LOW (ref 13.0–17.0)
Lymphocytes Relative: 32 %
Lymphs Abs: 2 10*3/uL (ref 0.7–4.0)
MCH: 31.3 pg (ref 26.0–34.0)
MCHC: 32.9 g/dL (ref 30.0–36.0)
MCV: 95.3 fL (ref 78.0–100.0)
MONO ABS: 0.4 10*3/uL (ref 0.1–1.0)
MONOS PCT: 7 %
NEUTROS ABS: 3.5 10*3/uL (ref 1.7–7.7)
Neutrophils Relative %: 57 %
PLATELETS: 179 10*3/uL (ref 150–400)
RBC: 3.8 MIL/uL — ABNORMAL LOW (ref 4.22–5.81)
RDW: 12.6 % (ref 11.5–15.5)
WBC: 6.1 10*3/uL (ref 4.0–10.5)

## 2016-04-21 LAB — URINE MICROSCOPIC-ADD ON
Bacteria, UA: NONE SEEN
RBC / HPF: NONE SEEN RBC/hpf (ref 0–5)
SQUAMOUS EPITHELIAL / LPF: NONE SEEN
WBC UA: NONE SEEN WBC/hpf (ref 0–5)

## 2016-04-21 LAB — LIPASE, BLOOD: LIPASE: 19 U/L (ref 11–51)

## 2016-04-21 LAB — I-STAT TROPONIN, ED: Troponin i, poc: 0 ng/mL (ref 0.00–0.08)

## 2016-04-21 MED ORDER — IOPAMIDOL (ISOVUE-300) INJECTION 61%
15.0000 mL | Freq: Once | INTRAVENOUS | Status: DC | PRN
  Administered 2016-04-21: 15 mL via ORAL
  Filled 2016-04-21: qty 30

## 2016-04-21 MED ORDER — FLEET ENEMA 7-19 GM/118ML RE ENEM
1.0000 | ENEMA | Freq: Once | RECTAL | Status: AC
  Administered 2016-04-21: 1 via RECTAL
  Filled 2016-04-21: qty 1

## 2016-04-21 MED ORDER — IOPAMIDOL (ISOVUE-300) INJECTION 61%
100.0000 mL | Freq: Once | INTRAVENOUS | Status: AC | PRN
  Administered 2016-04-21: 80 mL via INTRAVENOUS

## 2016-04-21 NOTE — ED Notes (Signed)
XR at bedside

## 2016-04-21 NOTE — Discharge Instructions (Signed)
Increase dose of Miralax Follow up with Dr. Marisue Humble

## 2016-04-21 NOTE — ED Triage Notes (Signed)
Per EMS, pt from home c/o constipation and generalized abdominal pain x2 days. Hx chronic constipation and dementia. A&Ox2, alert to baseline. Denies N/V.

## 2016-04-21 NOTE — ED Notes (Addendum)
Patient transported to CT 

## 2016-04-21 NOTE — ED Notes (Signed)
Assisted Erasmo Downer, RN with patient to bedside commode.

## 2016-04-21 NOTE — ED Notes (Signed)
Patient expelled thin liquid from enema without progression of any stool.

## 2016-04-21 NOTE — ED Provider Notes (Signed)
[facility] DEPT Provider Note   CSN: [id number] Arrival date & time: 04/21/16  1059     History   Chief Complaint Chief Complaint  Patient presents with  . Abdominal Pain  . Constipation    HPI [name] is a 82 y.o. male who presents with abdominal pain and constipation. PMH significant for dementia (baselin is A&O x 2), hx of diverticulitis in July, hx of constipation, CKD stage 3, hx of CVA with residual right sided deficits, essential tremor, PAF not currently anticoagulated. Wife at bedside provides most of history. He was last seen in the ER on 10/3 for some mild worsening confusion and dehydration but was not admitted since he had improvement in the ED. Wife states that he complained of an acute onset of abdominal pain since this morning - she noticed he started holding his abdomen. He ate breakfast and did not have any vomiting afterwards. She has not noticed that he's had a fever or chills. He has not been more confused, no SOB or cough. He has chronic issues with constipation and his last BM was 2 days ago and was hard. She has been giving him stool softeners, Miralax, and fiber. No melena or hematochezia. Previous abdominal surgeries remarkable for cholecystecomy, hernia repair, and bowel resection due to perforated small bowel. Normally walks with a walker at baseline. Lives at home with wife.   HPI  Past Medical History:  Diagnosis Date  . Arthritis   . CVA (cerebral infarction)   . ED (erectile dysfunction)   . Essential tremor   . Memory loss   . Paroxysmal atrial fibrillation (HCC)   . Sepsis ([place])   . Stroke ([place])   . Ventral hernia     Patient Active Problem List   Diagnosis Date Noted  . CKD (chronic kidney disease) stage 3, GFR 30-59 ml/min 12/18/2015  . Diverticulitis 12/16/2015  . Bowel perforation ([place]) 11/30/2014  . ARF (acute renal failure) ([place]) 11/30/2014  . Small bowel perforation ([place]) 11/30/2014  . Dementia, senile with delusions  01/09/2013  . Other sleep disturbances 07/18/2012  . Memory loss 07/18/2012  . Unspecified late effects of cerebrovascular disease 07/18/2012  . Senile dementia, uncomplicated 99991111  . Abnormality of gait 07/18/2012  . Tremor, essential 07/18/2012    Past Surgical History:  Procedure Laterality Date  . BACK SURGERY    . BOWEL RESECTION N/A 11/30/2014   Procedure: SMALL BOWEL RESECTION;  Surgeon: Alphonsa Overall, MD;  Location: [facility] ORS;  Service: General;  Laterality: N/A;  . COLON SURGERY    . GALLBLADDER SURGERY    . HERNIA REPAIR    . LAPAROSCOPIC LYSIS OF ADHESIONS N/A 11/30/2014   Procedure: LAPAROSCOPIC LYSIS OF ADHESIONS;  Surgeon: Alphonsa Overall, MD;  Location: [facility] ORS;  Service: General;  Laterality: N/A;  . perforated intes         Home Medications    Prior to Admission medications   Medication Sig Start Date End Date Taking? Authorizing Provider  cholecalciferol (VITAMIN D) 1000 UNITS tablet Take 2,000 Units by mouth daily.    Historical Provider, MD  clopidogrel (PLAVIX) 75 MG tablet Take 75 mg by mouth daily.    Historical Provider, MD  docusate sodium (COLACE) 100 MG capsule Take 200 mg by mouth at bedtime.     Historical Provider, MD  donepezil (ARICEPT) 23 MG TABS tablet Take 1 tablet (23 mg total) by mouth at bedtime. 02/09/16   Dennie Bible, NP  fexofenadine (ALLEGRA) 180 MG tablet  Take 180 mg by mouth daily.    Historical Provider, MD  fluticasone (FLONASE) 50 MCG/ACT nasal spray Place 2 sprays into the nose every morning.  01/01/13   Historical Provider, MD  memantine (NAMENDA) 10 MG tablet Take 1 tablet (10 mg total) by mouth 2 (two) times daily. 02/12/16   Dennie Bible, NP  multivitamin-iron-minerals-folic acid (CENTRUM) chewable tablet Chew 1 tablet by mouth daily.    Historical Provider, MD  multivitamin-lutein (OCUVITE-LUTEIN) CAPS capsule Take 1 capsule by mouth daily.    Historical Provider, MD  polyethylene glycol (MIRALAX / GLYCOLAX) packet  Take 17 g by mouth daily as needed for moderate constipation (If no BM in 24 hours as needed.). 12/10/14   Earnstine Regal, PA-C  propranolol ER (INDERAL LA) 60 MG 24 hr capsule Take 1 capsule (60 mg total) by mouth daily. 02/19/13   Philmore Pali, NP  Wheat Dextrin (BENEFIBER DRINK MIX PO) Take 2 each by mouth daily.    Historical Provider, MD    Family History Family History  Problem Relation Age of Onset  . Aneurysm Mother     stomach  . Dementia Father   . Stroke Father     Social History Social History  Substance Use Topics  . Smoking status: Former Smoker    Types: Cigarettes  . Smokeless tobacco: Never Used  . Alcohol use No     Allergies   Topamax [topiramate]   Review of Systems Review of Systems  Unable to perform ROS: Dementia     Physical Exam Updated Vital Signs BP 124/57 (BP Location: Right Arm)   Pulse (!) 50   Temp 97.8 F (36.6 C) (Oral)   Resp 14   SpO2 98%   Physical Exam  Constitutional: He appears well-developed and well-nourished. No distress.  HENT:  Head: Normocephalic and atraumatic.  Eyes: Conjunctivae are normal. Pupils are equal, round, and reactive to light. Right eye exhibits no discharge. Left eye exhibits no discharge. No scleral icterus.  Neck: Normal range of motion. Neck supple.  Cardiovascular: Regular rhythm.  Bradycardia present.  Exam reveals no gallop and no friction rub.   No murmur heard. Bradycardic at baseline  Pulmonary/Chest: Effort normal and breath sounds normal. No respiratory distress. He has no wheezes. He has no rales. He exhibits tenderness.  When asked where he is hurting patient points to right upper chest  Abdominal: Soft. Bowel sounds are normal. He exhibits no distension and no mass. There is tenderness. There is no rebound and no guarding. No hernia.  Multiple surgical scars. Generalized tenderness - mostly in the epigastric and right side of abdomen  Genitourinary:  Genitourinary Comments: Rectal: Soft  stool in rectal vault without impaction. No hemorrhoids, fissures, redness, area of fluctuance, lesions, or tenderness. Chaperone present during exam.   Musculoskeletal: He exhibits no edema.  Neurological: He is alert.  Skin: Skin is warm and dry.  Psychiatric: He has a normal mood and affect.  Nursing note and vitals reviewed.    ED Treatments / Results  Labs (all labs ordered are listed, but only abnormal results are displayed) Labs Reviewed - No data to display  EKG  EKG Interpretation  Date/Time:  Wednesday April 21 2016 12:16:34 EST Ventricular Rate:  46 PR Interval:    QRS Duration: 94 QT Interval:  451 QTC Calculation: 395 R Axis:   -74 Text Interpretation:  Sinus bradycardia Prolonged PR interval Abnormal R-wave progression, early transition Inferior infarct, old Baseline wander in lead(s) V5 No  significant change was found Confirmed by CAMPOS  MD, Lennette Bihari ([id number]) on 04/21/2016 12:19:13 PM       Radiology Ct Abdomen Pelvis W Contrast  Result Date: 04/21/2016 CLINICAL DATA:  Constipation, generalized abdominal pain for 2 days, dementia EXAM: CT ABDOMEN AND PELVIS WITH CONTRAST TECHNIQUE: Multidetector CT imaging of the abdomen and pelvis was performed using the standard protocol following bolus administration of intravenous contrast. CONTRAST:  58mL ISOVUE-300 IOPAMIDOL (ISOVUE-300) INJECTION 61% COMPARISON:  12/16/2015 FINDINGS: Lower chest: Lung bases shows bilateral lower lobe mild bronchitic changes. Hepatobiliary: Mild fatty infiltration of the liver. Status postcholecystectomy. No focal hepatic mass. Pancreas: Enhanced pancreas is unremarkable. Spleen: Enhanced spleen is unremarkable. Adrenals/Urinary Tract: No adrenal gland mass. Enhanced kidneys shows again bilateral cortical thinning. Bilateral renal cysts are again noted. The largest cyst in upper pole of the right kidney measures 2.4 cm. Largest cyst in midpole of the left kidney measures 2.8 cm. No hydronephrosis  or hydroureter. Delayed renal images shows bilateral renal symmetrical excretion. Bilateral visualized proximal ureter is not Stomach/Bowel: There is no small bowel obstruction. Mild dilated jejunal loops in left upper abdomen are stable from prior exam. There is no evidence of obstructing or constricting mass. Moderate stool noted in transverse colon and descending colon. There are colonic diverticula in descending colon and proximal sigmoid colon. No evidence of acute colitis or diverticulitis. Abundant stool and some colonic gas noted within rectum. The rectum measures 7.5 cm in diameter suspicious for mild fecal impaction. Vascular/Lymphatic: Atherosclerotic calcifications of abdominal aorta and iliac arteries. No adenopathy. No aortic aneurysm. Reproductive: Prostate gland is unremarkable. Nonspecific mild thickening of urinary bladder wall. Cystitis cannot be excluded. Clinical correlation is necessary. Other: Again noted postsurgical changes post hernia repair with mesh material in place in upper and right anterior abdominal wall. Again noted mild laxity of abdominal wall at the surgery site with mild anterior bulge of abdominal content and laxity of the fascia without definite evidence of hernia or acute complication. Musculoskeletal: No destructive bony lesions are noted. Extensive degenerative changes thoracolumbar spine again noted. Degenerative changes bilateral SI joints. There is a left inguinal scrotal canal hernia containing fat without evidence of acute complication. IMPRESSION: 1. There is no evidence of acute inflammatory process within abdomen. 2. Status postcholecystectomy. 3. No small bowel obstruction. 4. Moderate colonic stool noted in transverse colon and descending colon. Colonic diverticula are noted distal colon. Redundant sigmoid colon. No evidence of acute colitis or diverticulitis. 5. Moderate stool and gas noted within rectum. The rectum measures 7.5 cm in diameter suspicious for mild  fecal impaction. 6. Nonspecific mild thickening of urinary bladder wall. Cystitis cannot be excluded. Electronically Signed   By: Lahoma Crocker M.D.   On: 04/21/2016 14:18   Dg Chest Port 1 View  Result Date: 04/21/2016 CLINICAL DATA:  Constipation and abdominal pain for 2 days. EXAM: PORTABLE CHEST 1 VIEW COMPARISON:  03/16/2016. FINDINGS: Trachea is midline. Heart size stable. Lungs are somewhat low in volume with probable minimal bibasilar atelectasis. Small nodular opacification in the lateral aspect of the left midlung zone, not seen on 03/16/2016. No pleural fluid. IMPRESSION: Small area of nodular calcification in the lateral left mid lung zone, new from the prior exam and possibly artifactual. An infectious/inflammatory etiology is also considered. Electronically Signed   By: Lorin Picket M.D.   On: 04/21/2016 12:34    Procedures Procedures (including critical care time)  Medications Ordered in ED Medications  iopamidol (ISOVUE-300) 61 % injection 100 mL (80 mLs  Intravenous Contrast Given 04/21/16 1342)  sodium phosphate (FLEET) 7-19 GM/118ML enema 1 enema (1 enema Rectal Given 04/21/16 1604)     Initial Impression / Assessment and Plan / ED Course  I have reviewed the triage vital signs and the nursing notes.  Pertinent labs & imaging results that were available during my care of the patient were reviewed by me and considered in my medical decision making (see chart for details).  Clinical Course as of Apr 23 1839  Thu Apr 22, 2016  1837 DG Chest [place] 1 View [KG]    Clinical Course User Index [KG] Recardo Evangelist, [place]   82 year old with constipation. Patient is afebrile, not tachycardic or tachypneic, normotensive, and not hypoxic. He is bradycardic at baseline. CBC remarkable for mild anemia which is at baseline. CMP remarkable for elevated BUN/SCr which is also at baseline. Bilirubin is mildly elevated at 1.3. UA has 30 protein but is otherwise unremarkable. EKG is SR with  1st degree block with no significant change. Troponin is 0. CXR remarkable for small nodular calcification in left mid-lung. Likely this is artifact. Patient is afebrile, no cough, or leukocytosis. CT remarkable for moderate stool burden. Fleet enema ordered however patient was not able to have BM in the ED and I was unable to manually disimpact as stool was soft. Shared visit with Dr. Venora Maples. Advised wife to increase Miralax and continue stool softners. Close PCP follow up advised. Patient is NAD, non-toxic, with stable VS. Patient is informed of clinical course, understands medical decision making process, and agrees with plan. Opportunity for questions provided and all questions answered. Return precautions given.   Final Clinical Impressions(s) / ED Diagnoses   Final diagnoses:  Constipation, unspecified constipation type  Abdominal pain, unspecified abdominal location    New Prescriptions New Prescriptions   No medications on file     Recardo Evangelist, PA-C 04/22/16 [place], MD 04/24/16 [phone]

## 2016-08-09 DIAGNOSIS — G250 Essential tremor: Secondary | ICD-10-CM | POA: Diagnosis not present

## 2016-08-09 DIAGNOSIS — I700 Atherosclerosis of aorta: Secondary | ICD-10-CM | POA: Diagnosis not present

## 2016-08-09 DIAGNOSIS — E559 Vitamin D deficiency, unspecified: Secondary | ICD-10-CM | POA: Diagnosis not present

## 2016-08-09 DIAGNOSIS — Z8673 Personal history of transient ischemic attack (TIA), and cerebral infarction without residual deficits: Secondary | ICD-10-CM | POA: Diagnosis not present

## 2016-08-09 DIAGNOSIS — D692 Other nonthrombocytopenic purpura: Secondary | ICD-10-CM | POA: Diagnosis not present

## 2016-08-09 DIAGNOSIS — N183 Chronic kidney disease, stage 3 (moderate): Secondary | ICD-10-CM | POA: Diagnosis not present

## 2016-08-09 DIAGNOSIS — H9190 Unspecified hearing loss, unspecified ear: Secondary | ICD-10-CM | POA: Diagnosis not present

## 2016-08-09 DIAGNOSIS — R413 Other amnesia: Secondary | ICD-10-CM | POA: Diagnosis not present

## 2016-08-09 DIAGNOSIS — J309 Allergic rhinitis, unspecified: Secondary | ICD-10-CM | POA: Diagnosis not present

## 2016-08-09 DIAGNOSIS — R1310 Dysphagia, unspecified: Secondary | ICD-10-CM | POA: Diagnosis not present

## 2016-08-12 ENCOUNTER — Encounter: Payer: Self-pay | Admitting: Nurse Practitioner

## 2016-08-12 ENCOUNTER — Ambulatory Visit (INDEPENDENT_AMBULATORY_CARE_PROVIDER_SITE_OTHER): Payer: Medicare Other | Admitting: Nurse Practitioner

## 2016-08-12 VITALS — BP 145/63 | HR 55 | Wt 192.4 lb

## 2016-08-12 DIAGNOSIS — Z8673 Personal history of transient ischemic attack (TIA), and cerebral infarction without residual deficits: Secondary | ICD-10-CM | POA: Diagnosis not present

## 2016-08-12 DIAGNOSIS — R269 Unspecified abnormalities of gait and mobility: Secondary | ICD-10-CM

## 2016-08-12 DIAGNOSIS — F0390 Unspecified dementia without behavioral disturbance: Secondary | ICD-10-CM

## 2016-08-12 DIAGNOSIS — G250 Essential tremor: Secondary | ICD-10-CM

## 2016-08-12 NOTE — Patient Instructions (Addendum)
Continue Aricept at current dose will refill, Continue Namenda 10 mg twice daily will refill  Use walker at all times for safe ambulation at risk for falls Follow-up in 6 months, next with Dr. Leonie Hayden    Dementia Dementia is the loss of two or more brain functions, such as:  Memory.  Decision making.  Behavior.  Speaking.  Thinking.  Problem solving. There are many types of dementia. The most common type is called progressive dementia. Progressive dementia gets worse with time and it is irreversible. An example of this type of dementia is Alzheimer disease. What are the causes? This condition may be caused by:  Nerve cell damage in the brain.  Genetic mutations.  Certain medicines.  Multiple small strokes.  An infection, such as chronic meningitis.  A metabolic problem, such as vitamin B12 deficiency or thyroid disease.  Pressure on the brain, such as from a tumor or blood clot. What are the signs or symptoms? Symptoms of this condition include:  Sudden changes in mood.  Depression.  Problems with balance.  Changes in personality.  Poor short-term memory.  Agitation.  Delusions.  Hallucinations.  Having a hard time:  Speaking thoughts.  Finding words.  Solving problems.  Doing familiar tasks.  Understanding familiar ideas. How is this diagnosed? This condition is diagnosed with an assessment by your health care provider. During this assessment, your health care provider will talk with you and your family, friends, or caregivers about your symptoms. A thorough medical history will be taken, and you will have a physical exam and tests. Tests may include:  Lab tests, such as blood or urine tests.  Imaging tests, such as a CT scan, PET scan, or MRI.  A lumbar puncture. This test involves removing and testing a small amount of the fluid that surrounds the brain and spinal cord.  An electroencephalogram (EEG). In this test, small metal discs are  used to measure electrical activity in the brain.  Memory tests, cognitive tests, and neuropsychological tests. These tests evaluate brain function. How is this treated? Treatment depends on the cause of the dementia. It may involve taking medicines that may help:  To control the dementia.  To slow down the disease.  To manage symptoms. In some cases, treating the cause of the dementia can improve symptoms, reverse symptoms, or slow down how quickly the dementia gets worse. Your health care provider can help direct you to support groups, organizations, and other health care providers who can help with decisions about your care. Follow these instructions at home: Medicine   Take over-the-counter and prescription medicines only as told by your health care provider.  Avoid taking medicines that can affect thinking, such as pain or sleeping medicines. Lifestyle    Make healthy lifestyle choices:  Be physically active as told by your health care provider.  Do not use any tobacco products, such as cigarettes, chewing tobacco, and e-cigarettes. If you need help quitting, ask your health care provider.  Eat a healthy diet.  Practice stress-management techniques when you get stressed.  Stay social.  Drink enough fluid to keep your urine clear or pale yellow.  Make sure to get quality sleep. These tips can help you to get a good night's rest:  Avoid napping during the day.  Keep your sleeping area dark and cool.  Avoid exercising during the few hours before you go to bed.  Avoid caffeine products in the evening. General instructions   Work with your health care provider to  determine what you need help with and what your safety needs are.  If you were given a bracelet that tracks your location, make sure to wear it.  Keep all follow-up visits as told by your health care provider. This is important. Contact a health care provider if:  You have any new symptoms.  You have  problems with choking or swallowing.  You have any symptoms of a different illness. Get help right away if:  You develop a fever.  You have new or worsening confusion.  You have new or worsening sleepiness.  You have a hard time staying awake.  You or your family members become concerned for your safety. This information is not intended to replace advice given to you by your health care provider. Make sure you discuss any questions you have with your health care provider. Document Released: 11/24/2000 Document Revised: 10/09/2015 Document Reviewed: 02/26/2015 Elsevier Interactive Patient Education  2017 [employer].

## 2016-08-12 NOTE — Progress Notes (Signed)
[facility]  PATIENT: [name] DOB: [birth year]   REASON FOR VISIT: Follow-up for memory loss, history of stroke HISTORY FROM: Patient and wife and daughter [name]    HISTORY OF PRESENT ILLNESS:83 year old Caucasian male with long standing benign tremors which have worsened recently, mild senile dementia, remote left Pontine brain stroke in 2006 and multifactorial gait difficulty.   Update 8/30/2016PS : He returns for follow-up after last visit 6 months ago. Is accompanied by his family. The patient had to discontinue cervical as it made him too sleepy. He is more alert now and has only occasional hallucination or delusions. He has tolerated Namenda now and is currently on 10 mg twice daily. Patient walks little but with a walker and has had no falls. He in fact had abdominal surgery for perforated bowel and June 18 and has recovered very well. He spent a few weeks in rehabilitation and is now at home. He continues to have mild intermittent tremor and is tolerating Inderal well without any side effects. Update 08/12/15 PS; he returns for follow-up after last visit 6 months ago. Is accompanied by wife and daughter. He feels he may have cognitively declined slightly. His short-term memory is poor and needs constant repetition and reminders. He however still manages to meet his activities of daily living by himself. He walks with a walker and short distances he can hold onto walls. He's had no recent falls. There has been no behavioral issues with agitation, delusions or hallucinations. Occasionally can get disoriented and confused. He is tolerating Aricept and Namenda well without problems. He has occasional hand tremors but he takes Inderal which seems to help him. There have been no new health problems. He does have some swelling in his leg. UPDATE 08/28/2017CM [name], 83 year old male returns for follow-up. He has a long history of memory loss and is currently on Aricept  23 mg daily along with Namenda 10 mg twice daily. He had a hospital admission for diverticulitis in July. He is  back home. He and his wife feel his memory is stable. He is independent with his activities of daily living. He has not driven in several years. There have been no behavior issues. He returns for reevaluation UPDATE 03/01/2018CM [name], 83 year old male returns for follow-up. He has a long history of memory loss/progressive dementia and is currently on Aricept 23 mg daily along with Namenda 10 mg twice daily His short-term memory is poor he needs constant reminders. He has difficulty following conversations. He can still feed and dress himself. He chokes on liquids occasionally. He wears depends. He ambulates with a walker and wife reports no falls there have been no recent behavioral issues or hallucinations, no wandering behavior at night he sleeps fairly well. He does sleep a lot during the day as well and I suggested he may be depressed however the wife does not want him to be on any medication for this. He has had no interval health issues. He returns for reevaluation. REVIEW OF SYSTEMS: Full 14 system review of systems performed and notable only for those listed, all others are neg:  Constitutional: neg  Cardiovascular: neg Ear/Nose/Throat: neg  Skin: neg Eyes: neg Respiratory: neg Gastroitestinal: neg  Hematology/Lymphatic: neg  Endocrine: neg Musculoskeletal: Walking difficulty Allergy/Immunology: neg Neurological: Tremors, memory loss Psychiatric: Confusion Sleep : neg   ALLERGIES: Allergies  Allergen Reactions  . Topamax [Topiramate] Other (See Comments)    Unknown    HOME MEDICATIONS: Outpatient Medications Prior  to Visit  Medication Sig Dispense Refill  . aspirin 325 MG EC tablet Take 325 mg by mouth daily.    . cholecalciferol (VITAMIN D) 1000 UNITS tablet Take 2,000 Units by mouth daily.    Marland Kitchen docusate sodium (COLACE) 100 MG capsule Take 300 mg by mouth at  bedtime.     . donepezil (ARICEPT) 23 MG TABS tablet Take 1 tablet (23 mg total) by mouth at bedtime. 90 tablet 2  . fexofenadine (ALLEGRA) 180 MG tablet Take 180 mg by mouth every evening.     . fluticasone (FLONASE) 50 MCG/ACT nasal spray Place 2 sprays into the nose every evening.     . memantine (NAMENDA) 10 MG tablet Take 1 tablet (10 mg total) by mouth 2 (two) times daily. 180 tablet 3  . multivitamin-iron-minerals-folic acid (CENTRUM) chewable tablet Chew 1 tablet by mouth daily.    . multivitamin-lutein (OCUVITE-LUTEIN) CAPS capsule Take 1 capsule by mouth daily.    . polyethylene glycol (MIRALAX / GLYCOLAX) packet Take 17 g by mouth daily as needed for moderate constipation (If no BM in 24 hours as needed.). (Patient taking differently: Take 17 g by mouth at bedtime as needed for moderate constipation (If no BM in 24 hours as needed.). ) 14 each 0  . propranolol ER (INDERAL LA) 60 MG 24 hr capsule Take 1 capsule (60 mg total) by mouth daily.    . Wheat Dextrin (BENEFIBER DRINK MIX PO) Take 2 each by mouth daily.     No facility-administered medications prior to visit.     PAST MEDICAL HISTORY: Past Medical History:  Diagnosis Date  . Arthritis   . CVA (cerebral infarction)   . ED (erectile dysfunction)   . Essential tremor   . Memory loss   . Paroxysmal atrial fibrillation (HCC)   . Sepsis ([place])   . Stroke ([place])   . Ventral hernia     PAST SURGICAL HISTORY: Past Surgical History:  Procedure Laterality Date  . BACK SURGERY    . BOWEL RESECTION N/A 11/30/2014   Procedure: SMALL BOWEL RESECTION;  Surgeon: Alphonsa Overall, MD;  Location: [facility];  Service: General;  Laterality: N/A;  . COLON SURGERY    . GALLBLADDER SURGERY    . HERNIA REPAIR    . LAPAROSCOPIC LYSIS OF ADHESIONS N/A 11/30/2014   Procedure: LAPAROSCOPIC LYSIS OF ADHESIONS;  Surgeon: Alphonsa Overall, MD;  Location: [facility];  Service: General;  Laterality: N/A;  . perforated intes      FAMILY HISTORY: Family  History  Problem Relation Age of Onset  . Aneurysm Mother     stomach  . Dementia Father   . Stroke Father     SOCIAL HISTORY: Social History   Social History  . Marital status: Married    Spouse name: [name]  . Number of children: 2  . Years of education: 10th   Occupational History  .  Retired   Social History Main Topics  . Smoking status: Former Smoker    Types: Cigarettes  . Smokeless tobacco: Never Used  . Alcohol use No  . Drug use: No  . Sexual activity: Not on file   Other Topics Concern  . Not on file   Social History Narrative   Patient lives at home with spouse.   Caffeine Use: 1 cup daily     PHYSICAL EXAM  Vitals:   08/12/16 1442  BP: (!) 145/63  Pulse: (!) 55  Weight: 192 lb 6.4 oz (87.3 kg)  Body mass index is 26.09 kg/m. Generalized: In no acute distress, pleasant elderly Caucasian male  Neck: Supple, no carotid bruits  Cardiac: Regular rate rhythm, no murmur,  Skin LE edema 1+ Musculoskeletal: mild kyphoscoliosis, stooped posture   Neurological examination  Mentation: Alert oriented to person, time (not date or year). Speech and language appear normal and fluent. MMSE 11/30 t. with deficits in orientation  0/3 recall, unable to write a sentence or copy a figure .  Animal Fluency Testing 2   MMSE - Mini Mental State Exam 08/12/2016 02/09/2016 08/29/2014  Orientation to time 0 0 2  Orientation to Place 0 3 3  Registration 3 3 3   Attention/ Calculation 1 0 3  Recall 0 0 0  Language- name 2 objects 2 2 2   Language- repeat 0 1 1  Language- follow 3 step command 3 3 3   Language- read & follow direction 1 1 1   Write a sentence 0 0 1  Copy design 1 0 1  Total score 11 13 20      .     Cranial nerve II-XII: Pupils were equal round reactive to light extraocular movements were full, visual field were full on confrontational test. facial sensation and strength were normal. hearing is decreased bilaterally. Uvula tongue midline. head turning and  shoulder shrug and were normal and symmetric.Tongue protrusion into cheek strength was normal.  MOTOR: normal bulk and tone, full strength in the BUE, BLE, fine finger movements normal, no pronator drift. Mild action tremor of outstretched upper extremities. Absent at rest. No cogwheel rigidity.  SENSORY: normal and symmetric to light touch, pinprick and vibratory in the upper and lower extremities COORDINATION: finger-nose-finger, heel-to-shin bilaterally, there was no truncal ataxia  REFLEXES: 1+ upper lower and symmetric, plantar responses were flexor bilaterally.   GAIT/STATION: Arises from the chair with pushoff  Steady but slow cautious gait , uses a walker DIAGNOSTIC DATA (LABS, IMAGING, TESTING) - I reviewed patient records, labs, notes, testing and imaging myself where available.  Lab Results  Component Value Date   WBC 6.1 04/21/2016   HGB 11.9 (L) 04/21/2016   HCT 36.2 (L) 04/21/2016   MCV 95.3 04/21/2016   PLT 179 04/21/2016      Component Value Date/Time   NA 138 04/21/2016 1210   K 4.3 04/21/2016 1210   CL 106 04/21/2016 1210   CO2 28 04/21/2016 1210   GLUCOSE 114 (H) 04/21/2016 1210   BUN 24 (H) 04/21/2016 1210   CREATININE 1.81 (H) 04/21/2016 1210   CALCIUM 8.9 04/21/2016 1210   PROT 6.6 04/21/2016 1210   ALBUMIN 3.8 04/21/2016 1210   AST 22 04/21/2016 1210   ALT 14 (L) 04/21/2016 1210   ALKPHOS 63 04/21/2016 1210   BILITOT 1.3 (H) 04/21/2016 1210   GFRNONAA 31 (L) 04/21/2016 1210   GFRAA 35 (L) 04/21/2016 1210    ASSESSMENT AND PLAN 83 year old Caucasian male with long standing history of benign essential tremors, severe dementia, remote left Pontine brain stroke in 2006 and multi-factorial gait difficulty.  Essential tremor on Inderal LA 60 mg daily. MMSE 11/30 on Aricept 23 mg and Namenda 10 mg twice daily. Memory   continues to progress   No recurrent neurovascular symptoms.   PLAN: Continue Aricept at current dose will refill, Continue Namenda 10 mg  twice daily will refill mail-order Use walker at all times for safe ambulation at risk for falls Follow-up in 6 months, next with Dr. Leonie Man I spent 25 minutes in total  face to face time with the patient, wife and daughter more than 50% of which was spent counseling and coordination of care, reviewing test results reviewing medications and discussing and reviewing the diagnosis of advanced progressive dementia and further treatment options.Rayburn Ma, [facility], APRN  [facility] [address], [place], [place] [address] [phone]

## 2016-08-13 NOTE — Progress Notes (Signed)
I agree with the above plan 

## 2016-10-05 DIAGNOSIS — J069 Acute upper respiratory infection, unspecified: Secondary | ICD-10-CM | POA: Diagnosis not present

## 2016-10-05 DIAGNOSIS — R05 Cough: Secondary | ICD-10-CM | POA: Diagnosis not present

## 2016-11-29 ENCOUNTER — Other Ambulatory Visit: Payer: Self-pay | Admitting: *Deleted

## 2016-11-29 MED ORDER — DONEPEZIL HCL 23 MG PO TABS: 23.0000 mg | ORAL_TABLET | Freq: Every day | ORAL | 3 refills | Status: AC

## 2016-11-29 NOTE — Telephone Encounter (Signed)
Next follow up 02-21-17 with Dr. Leonie Man.

## 2017-02-08 DIAGNOSIS — G250 Essential tremor: Secondary | ICD-10-CM | POA: Diagnosis not present

## 2017-02-08 DIAGNOSIS — D692 Other nonthrombocytopenic purpura: Secondary | ICD-10-CM | POA: Diagnosis not present

## 2017-02-08 DIAGNOSIS — H9190 Unspecified hearing loss, unspecified ear: Secondary | ICD-10-CM | POA: Diagnosis not present

## 2017-02-08 DIAGNOSIS — E559 Vitamin D deficiency, unspecified: Secondary | ICD-10-CM | POA: Diagnosis not present

## 2017-02-08 DIAGNOSIS — N183 Chronic kidney disease, stage 3 (moderate): Secondary | ICD-10-CM | POA: Diagnosis not present

## 2017-02-08 DIAGNOSIS — R413 Other amnesia: Secondary | ICD-10-CM | POA: Diagnosis not present

## 2017-02-08 DIAGNOSIS — J309 Allergic rhinitis, unspecified: Secondary | ICD-10-CM | POA: Diagnosis not present

## 2017-02-08 DIAGNOSIS — Z23 Encounter for immunization: Secondary | ICD-10-CM | POA: Diagnosis not present

## 2017-02-08 DIAGNOSIS — I700 Atherosclerosis of aorta: Secondary | ICD-10-CM | POA: Diagnosis not present

## 2017-02-08 DIAGNOSIS — Z8673 Personal history of transient ischemic attack (TIA), and cerebral infarction without residual deficits: Secondary | ICD-10-CM | POA: Diagnosis not present

## 2017-02-21 ENCOUNTER — Encounter: Payer: Self-pay | Admitting: Neurology

## 2017-02-21 ENCOUNTER — Ambulatory Visit (INDEPENDENT_AMBULATORY_CARE_PROVIDER_SITE_OTHER): Payer: Medicare Other | Admitting: Neurology

## 2017-02-21 VITALS — BP 123/56 | HR 51 | Ht 72.0 in | Wt 179.4 lb

## 2017-02-21 DIAGNOSIS — F0150 Vascular dementia without behavioral disturbance: Secondary | ICD-10-CM

## 2017-02-21 NOTE — Patient Instructions (Signed)
I had a long discussion with the patient, his son and wife regarding his dementia and answered questions. Continue Aricept 23 mg daily and Namenda 10 mg twice daily. The patient clearly needs 24-hour supervision. The patient's wife at the present time is able to provide the care he needs but I advised him to consider moving to a nursing home in the future if his condition declines further. He was advise fallen safety precautions. The patient otherwise to follow-up in the future with primary care physician for medication refills. No routine schedule appointment with me is necessary but he may be referred back in the future as needed for his primary physician.

## 2017-02-21 NOTE — Progress Notes (Signed)
[facility]  PATIENT: QUANTAVIS OBRYANT DOB: [birth year]   REASON FOR VISIT: Follow-up for memory loss, history of stroke HISTORY FROM: Patient and wife and daughter [name]    HISTORY OF PRESENT ILLNESS:83 year old Caucasian male with long standing benign tremors which have worsened recently, mild senile dementia, remote left Pontine brain stroke in 2006 and multifactorial gait difficulty.   Update 8/30/2016PS : He returns for follow-up after last visit 6 months ago. Is accompanied by his family. The patient had to discontinue cervical as it made him too sleepy. He is more alert now and has only occasional hallucination or delusions. He has tolerated Namenda now and is currently on 10 mg twice daily. Patient walks little but with a walker and has had no falls. He in fact had abdominal surgery for perforated bowel and June 18 and has recovered very well. He spent a few weeks in rehabilitation and is now at home. He continues to have mild intermittent tremor and is tolerating Inderal well without any side effects. Update 08/12/15 PS; he returns for follow-up after last visit 6 months ago. Is accompanied by wife and daughter. He feels he may have cognitively declined slightly. His short-term memory is poor and needs constant repetition and reminders. He however still manages to meet his activities of daily living by himself. He walks with a walker and short distances he can hold onto walls. He's had no recent falls. There has been no behavioral issues with agitation, delusions or hallucinations. Occasionally can get disoriented and confused. He is tolerating Aricept and Namenda well without problems. He has occasional hand tremors but he takes Inderal which seems to help him. There have been no new health problems. He does have some swelling in his leg. UPDATE 08/28/2017CM Mr. Weimar, 83 year old male returns for follow-up. He has a long history of memory loss and is currently on Aricept  23 mg daily along with Namenda 10 mg twice daily. He had a hospital admission for diverticulitis in July. He is  back home. He and his wife feel his memory is stable. He is independent with his activities of daily living. He has not driven in several years. There have been no behavior issues. He returns for reevaluation UPDATE 03/01/2018CM Mr. Cornelio, 83 year old male returns for follow-up. He has a long history of memory loss/progressive dementia and is currently on Aricept 23 mg daily along with Namenda 10 mg twice daily His short-term memory is poor he needs constant reminders. He has difficulty following conversations. He can still feed and dress himself. He chokes on liquids occasionally. He wears depends. He ambulates with a walker and wife reports no falls there have been no recent behavioral issues or hallucinations, no wandering behavior at night he sleeps fairly well. He does sleep a lot during the day as well and I suggested he may be depressed however the wife does not want him to be on any medication for this. He has had no interval health issues. He returns for reevaluation. Update 02/21/2017 : He returns for follow-up after last visit 6 months ago. He is accompanied by his son and wife. Wife states that she feels his getting slightly worse. He is more forgetful. At times even has difficulty swallowing his pills and as if he forgets how to swallow them. He does have good days and bad days and often fluctuation in his confusion. There have been no agitation or violent behavior. Family keeps a close watch on him. He has not  had any  falls or injuries. Does not have any delusions or hallucinations. He is tolerating Aricept 23 mg daily without any dizziness or GI side effects. He is also on Namenda 10 mg twice daily without significant side effects. REVIEW OF SYSTEMS: Full 14 system review of systems performed and notable only for those listed, all others are neg:   Leg swelling, daytime sleepiness,  sleep talking, incontinence of bladder, muscle cramps, walking difficulty, easy bruising, memory loss, tremors, confusion and all other systems negative ALLERGIES: Allergies  Allergen Reactions  . Topamax [Topiramate] Other (See Comments)    Unknown    HOME MEDICATIONS: Outpatient Medications Prior to Visit  Medication Sig Dispense Refill  . aspirin 325 MG EC tablet Take 325 mg by mouth daily.    . cholecalciferol (VITAMIN D) 1000 UNITS tablet Take 2,000 Units by mouth daily.    Marland Kitchen docusate sodium (COLACE) 100 MG capsule Take 300 mg by mouth at bedtime.     . donepezil (ARICEPT) 23 MG TABS tablet Take 1 tablet (23 mg total) by mouth at bedtime. 90 tablet 3  . fexofenadine (ALLEGRA) 180 MG tablet Take 180 mg by mouth every evening.     . fluticasone (FLONASE) 50 MCG/ACT nasal spray Place 2 sprays into the nose every evening.     . memantine (NAMENDA) 10 MG tablet Take 1 tablet (10 mg total) by mouth 2 (two) times daily. 180 tablet 3  . multivitamin-iron-minerals-folic acid (CENTRUM) chewable tablet Chew 1 tablet by mouth daily.    . multivitamin-lutein (OCUVITE-LUTEIN) CAPS capsule Take 1 capsule by mouth daily.    . polyethylene glycol (MIRALAX / GLYCOLAX) packet Take 17 g by mouth daily as needed for moderate constipation (If no BM in 24 hours as needed.). (Patient taking differently: Take 17 g by mouth at bedtime as needed for moderate constipation (If no BM in 24 hours as needed.). ) 14 each 0  . propranolol ER (INDERAL LA) 60 MG 24 hr capsule Take 1 capsule (60 mg total) by mouth daily.    . Wheat Dextrin (BENEFIBER DRINK MIX PO) Take 2 each by mouth daily.     No facility-administered medications prior to visit.     PAST MEDICAL HISTORY: Past Medical History:  Diagnosis Date  . Arthritis   . CVA (cerebral infarction)   . ED (erectile dysfunction)   . Essential tremor   . Memory loss   . Paroxysmal atrial fibrillation (HCC)   . Sepsis ([place])   . Stroke ([place])   . Ventral hernia      PAST SURGICAL HISTORY: Past Surgical History:  Procedure Laterality Date  . BACK SURGERY    . BOWEL RESECTION N/A 11/30/2014   Procedure: SMALL BOWEL RESECTION;  Surgeon: Alphonsa Overall, MD;  Location: [facility];  Service: General;  Laterality: N/A;  . COLON SURGERY    . GALLBLADDER SURGERY    . HERNIA REPAIR    . LAPAROSCOPIC LYSIS OF ADHESIONS N/A 11/30/2014   Procedure: LAPAROSCOPIC LYSIS OF ADHESIONS;  Surgeon: Alphonsa Overall, MD;  Location: [facility];  Service: General;  Laterality: N/A;  . perforated intes      FAMILY HISTORY: Family History  Problem Relation Age of Onset  . Aneurysm Mother        stomach  . Dementia Father   . Stroke Father     SOCIAL HISTORY: Social History   Social History  . Marital status: Married    Spouse name: [name]  . Number of children: 2  .  Years of education: 10th   Occupational History  .  Retired   Social History Main Topics  . Smoking status: Former Smoker    Types: Cigarettes  . Smokeless tobacco: Never Used  . Alcohol use No  . Drug use: No  . Sexual activity: Not on file   Other Topics Concern  . Not on file   Social History Narrative   Patient lives at home with spouse.   Caffeine Use: 1 cup daily     PHYSICAL EXAM  Vitals:   02/21/17 1344  BP: (!) 123/56  Pulse: (!) 51  Weight: 179 lb 6.4 oz (81.4 kg)  Height: 6' (1.829 m)   Body mass index is 24.33 kg/m. Generalized: In no acute distress, pleasant elderly Caucasian male  Neck: Supple, no carotid bruits  Cardiac: Regular rate rhythm, no murmur,  Skin LE edema 1+ Musculoskeletal: mild kyphoscoliosis, stooped posture   Neurological examination  Mentation: Alert oriented to person, time (not date or year). Speech and language appear normal and fluent. MMSE not done but deficits in attention,orientation  0/3 recall, unable to write a sentence or copy a figure .  Animal Fluency Testing 2   MMSE - Mini Mental State Exam 08/12/2016 02/09/2016 08/29/2014  Orientation  to time 0 0 2  Orientation to Place 0 3 3  Registration 3 3 3   Attention/ Calculation 1 0 3  Recall 0 0 0  Language- name 2 objects 2 2 2   Language- repeat 0 1 1  Language- follow 3 step command 3 3 3   Language- read & follow direction 1 1 1   Write a sentence 0 0 1  Copy design 1 0 1  Total score 11 13 20      .     Cranial nerve II-XII: Pupils were equal round reactive to light extraocular movements were full, visual field were full on confrontational test. facial sensation and strength were normal. hearing is decreased bilaterally. Uvula tongue midline. head turning and shoulder shrug and were normal and symmetric.Tongue protrusion into cheek strength was normal.  MOTOR: normal bulk and tone, full strength in the BUE, BLE, fine finger movements normal, no pronator drift. Mild action tremor of outstretched upper extremities. Absent at rest. No cogwheel rigidity.  SENSORY: normal and symmetric to light touch, pinprick and vibratory in the upper and lower extremities COORDINATION: finger-nose-finger, heel-to-shin bilaterally, there was no truncal ataxia  REFLEXES: 1+ upper lower and symmetric, plantar responses were flexor bilaterally.   GAIT/STATION: Arises from the chair with pushoff  Steady but slow cautious gait , uses a walker DIAGNOSTIC DATA (LABS, IMAGING, TESTING) - I reviewed patient records, labs, notes, testing and imaging myself where available.  Lab Results  Component Value Date   WBC 6.1 04/21/2016   HGB 11.9 (L) 04/21/2016   HCT 36.2 (L) 04/21/2016   MCV 95.3 04/21/2016   PLT 179 04/21/2016      Component Value Date/Time   NA 138 04/21/2016 1210   K 4.3 04/21/2016 1210   CL 106 04/21/2016 1210   CO2 28 04/21/2016 1210   GLUCOSE 114 (H) 04/21/2016 1210   BUN 24 (H) 04/21/2016 1210   CREATININE 1.81 (H) 04/21/2016 1210   CALCIUM 8.9 04/21/2016 1210   PROT 6.6 04/21/2016 1210   ALBUMIN 3.8 04/21/2016 1210   AST 22 04/21/2016 1210   ALT 14 (L) 04/21/2016 1210    ALKPHOS 63 04/21/2016 1210   BILITOT 1.3 (H) 04/21/2016 1210   GFRNONAA 31 (L) 04/21/2016 1210  GFRAA 35 (L) 04/21/2016 1210    ASSESSMENT AND PLAN 83 year old Caucasian male with long standing history of benign essential tremors, severe dementia, remote left [place] brain stroke in 2006 and multi-factorial gait difficulty.  Essential tremor on Inderal LA 60 mg daily. MMSE 11/30 last visit march 2018 on Aricept 23 mg and Namenda 10 mg twice daily. Memory   continues to progress   No recurrent neurovascular symptoms.   PLAN:I had a long discussion with the patient, his son and wife regarding his dementia and answered questions. Continue Aricept 23 mg daily and Namenda 10 mg twice daily. The patient clearly needs 24-hour supervision. The patient's wife at the present time is able to provide the care he needs but I advised him to consider moving to a nursing home in the future if his condition declines further. He was advise fallen safety precautions. The patient advised to follow-up in the future with primary care physician for medication refills. No routine schedule appointment with me is necessary but he may be referred back in the future as needed for his primary physician. Greater than 50% time during this 25 minute visit was spent on counseling and coordination of care about his dementia and cognitive impairment and answering questions Antony Contras, MD. [facility] [address] [place], [place] [address] [phone]

## 2017-03-05 DIAGNOSIS — L03116 Cellulitis of left lower limb: Secondary | ICD-10-CM | POA: Diagnosis not present

## 2017-03-10 DIAGNOSIS — L02416 Cutaneous abscess of left lower limb: Secondary | ICD-10-CM | POA: Diagnosis not present

## 2017-04-28 DIAGNOSIS — F0390 Unspecified dementia without behavioral disturbance: Secondary | ICD-10-CM | POA: Diagnosis not present

## 2017-04-28 DIAGNOSIS — I639 Cerebral infarction, unspecified: Secondary | ICD-10-CM | POA: Diagnosis not present

## 2017-04-28 DIAGNOSIS — N183 Chronic kidney disease, stage 3 (moderate): Secondary | ICD-10-CM | POA: Diagnosis not present

## 2017-06-08 ENCOUNTER — Encounter (HOSPITAL_COMMUNITY): Payer: Self-pay

## 2017-06-08 ENCOUNTER — Observation Stay (HOSPITAL_COMMUNITY)
Admission: EM | Admit: 2017-06-08 | Discharge: 2017-06-14 | Disposition: E | Payer: Medicare Other | Attending: Internal Medicine | Admitting: Internal Medicine

## 2017-06-08 ENCOUNTER — Other Ambulatory Visit: Payer: Self-pay

## 2017-06-08 ENCOUNTER — Emergency Department (HOSPITAL_COMMUNITY): Payer: Medicare Other

## 2017-06-08 DIAGNOSIS — R112 Nausea with vomiting, unspecified: Secondary | ICD-10-CM | POA: Diagnosis not present

## 2017-06-08 DIAGNOSIS — Z9049 Acquired absence of other specified parts of digestive tract: Secondary | ICD-10-CM | POA: Diagnosis not present

## 2017-06-08 DIAGNOSIS — N183 Chronic kidney disease, stage 3 unspecified: Secondary | ICD-10-CM | POA: Diagnosis present

## 2017-06-08 DIAGNOSIS — Z87891 Personal history of nicotine dependence: Secondary | ICD-10-CM | POA: Insufficient documentation

## 2017-06-08 DIAGNOSIS — J9811 Atelectasis: Secondary | ICD-10-CM | POA: Diagnosis not present

## 2017-06-08 DIAGNOSIS — R0989 Other specified symptoms and signs involving the circulatory and respiratory systems: Secondary | ICD-10-CM | POA: Insufficient documentation

## 2017-06-08 DIAGNOSIS — Z7982 Long term (current) use of aspirin: Secondary | ICD-10-CM | POA: Insufficient documentation

## 2017-06-08 DIAGNOSIS — Z79899 Other long term (current) drug therapy: Secondary | ICD-10-CM | POA: Insufficient documentation

## 2017-06-08 DIAGNOSIS — F0390 Unspecified dementia without behavioral disturbance: Secondary | ICD-10-CM | POA: Insufficient documentation

## 2017-06-08 DIAGNOSIS — K838 Other specified diseases of biliary tract: Secondary | ICD-10-CM | POA: Insufficient documentation

## 2017-06-08 DIAGNOSIS — Z8673 Personal history of transient ischemic attack (TIA), and cerebral infarction without residual deficits: Secondary | ICD-10-CM | POA: Insufficient documentation

## 2017-06-08 DIAGNOSIS — R0600 Dyspnea, unspecified: Secondary | ICD-10-CM

## 2017-06-08 DIAGNOSIS — Z7951 Long term (current) use of inhaled steroids: Secondary | ICD-10-CM | POA: Insufficient documentation

## 2017-06-08 DIAGNOSIS — F22 Delusional disorders: Secondary | ICD-10-CM

## 2017-06-08 DIAGNOSIS — I469 Cardiac arrest, cause unspecified: Principal | ICD-10-CM | POA: Insufficient documentation

## 2017-06-08 DIAGNOSIS — K859 Acute pancreatitis without necrosis or infection, unspecified: Secondary | ICD-10-CM | POA: Diagnosis present

## 2017-06-08 DIAGNOSIS — I700 Atherosclerosis of aorta: Secondary | ICD-10-CM | POA: Diagnosis not present

## 2017-06-08 DIAGNOSIS — Z66 Do not resuscitate: Secondary | ICD-10-CM | POA: Insufficient documentation

## 2017-06-08 DIAGNOSIS — F0392 Unspecified dementia, unspecified severity, with psychotic disturbance: Secondary | ICD-10-CM | POA: Diagnosis present

## 2017-06-08 DIAGNOSIS — R627 Adult failure to thrive: Secondary | ICD-10-CM | POA: Diagnosis not present

## 2017-06-08 DIAGNOSIS — R109 Unspecified abdominal pain: Secondary | ICD-10-CM | POA: Insufficient documentation

## 2017-06-08 DIAGNOSIS — K4090 Unilateral inguinal hernia, without obstruction or gangrene, not specified as recurrent: Secondary | ICD-10-CM | POA: Diagnosis not present

## 2017-06-08 HISTORY — DX: Perforation of intestine (nontraumatic): K63.1

## 2017-06-08 LAB — COMPREHENSIVE METABOLIC PANEL
ALT: 28 U/L (ref 17–63)
ANION GAP: 8 (ref 5–15)
AST: 67 U/L — ABNORMAL HIGH (ref 15–41)
Albumin: 3.4 g/dL — ABNORMAL LOW (ref 3.5–5.0)
Alkaline Phosphatase: 93 U/L (ref 38–126)
BILIRUBIN TOTAL: 1.2 mg/dL (ref 0.3–1.2)
BUN: 22 mg/dL — AB (ref 6–20)
CALCIUM: 8.9 mg/dL (ref 8.9–10.3)
CO2: 23 mmol/L (ref 22–32)
Chloride: 106 mmol/L (ref 101–111)
Creatinine, Ser: 1.76 mg/dL — ABNORMAL HIGH (ref 0.61–1.24)
GFR calc Af Amer: 36 mL/min — ABNORMAL LOW (ref >60)
GFR, EST NON AFRICAN AMERICAN: 31 mL/min — AB (ref >60)
Glucose, Bld: 111 mg/dL — ABNORMAL HIGH (ref 65–99)
POTASSIUM: 4.4 mmol/L (ref 3.5–5.1)
Sodium: 137 mmol/L (ref 135–145)
TOTAL PROTEIN: 6.5 g/dL (ref 6.5–8.1)

## 2017-06-08 LAB — CBC
HEMATOCRIT: 39.4 % (ref 39.0–52.0)
HEMOGLOBIN: 12.8 g/dL — AB (ref 13.0–17.0)
MCH: 31.1 pg (ref 26.0–34.0)
MCHC: 32.5 g/dL (ref 30.0–36.0)
MCV: 95.9 fL (ref 78.0–100.0)
Platelets: 172 10*3/uL (ref 150–400)
RBC: 4.11 MIL/uL — ABNORMAL LOW (ref 4.22–5.81)
RDW: 13.4 % (ref 11.5–15.5)
WBC: 7.7 10*3/uL (ref 4.0–10.5)

## 2017-06-08 LAB — URINALYSIS, ROUTINE W REFLEX MICROSCOPIC
BILIRUBIN URINE: NEGATIVE
GLUCOSE, UA: NEGATIVE mg/dL
Hgb urine dipstick: NEGATIVE
Ketones, ur: NEGATIVE mg/dL
LEUKOCYTES UA: NEGATIVE
Nitrite: NEGATIVE
PROTEIN: NEGATIVE mg/dL
SPECIFIC GRAVITY, URINE: 1.015 (ref 1.005–1.030)
pH: 5 (ref 5.0–8.0)

## 2017-06-08 LAB — LIPASE, BLOOD: Lipase: 89 U/L — ABNORMAL HIGH (ref 11–51)

## 2017-06-08 NOTE — ED Notes (Signed)
Urine culture sent to lab as a save if needed.

## 2017-06-08 NOTE — ED Notes (Signed)
Bed: WA17 Expected date:  Expected time:  Means of arrival:  Comments: EMS 83 yo male abdominal pain-hx perforated bowel-abdomen distended

## 2017-06-08 NOTE — ED Triage Notes (Signed)
Wife states patient ate spaghetti and cooked apples and garlic bread for dinner-drank ice tea/wife gave no meds at home

## 2017-06-08 NOTE — ED Notes (Signed)
Patient is aware a urine sample is needed and has a urinal at bedside.  

## 2017-06-08 NOTE — ED Triage Notes (Signed)
Patient arrives by [facility] from home/complaints of abdominal pain that started at 1900-generalized abdominal pain/has dementia-wife stated more distention today-2 BM's today normal-gas this evening with some relief of abdominal pain. EMS states patient watching TV when they arrived and patient in no distress. VS BP 156/84 HR 68 RR16 O2 sat 99% RA.

## 2017-06-08 NOTE — ED Notes (Signed)
Patient is resting comfortably with wife and daughter at bedside-will apply condom cath in attempt to get urine specimen-patient's daughter states he is incontinent of urine and does not know when he needs to void

## 2017-06-08 NOTE — ED Notes (Signed)
Patient has a condom cath on due to incontinence. Waiting for patient to urinate so that a sample can be collected.

## 2017-06-09 ENCOUNTER — Observation Stay (HOSPITAL_COMMUNITY): Payer: Medicare Other

## 2017-06-09 DIAGNOSIS — N183 Chronic kidney disease, stage 3 (moderate): Secondary | ICD-10-CM | POA: Diagnosis not present

## 2017-06-09 DIAGNOSIS — K859 Acute pancreatitis without necrosis or infection, unspecified: Secondary | ICD-10-CM | POA: Diagnosis present

## 2017-06-09 DIAGNOSIS — F0390 Unspecified dementia without behavioral disturbance: Secondary | ICD-10-CM | POA: Diagnosis not present

## 2017-06-09 DIAGNOSIS — F22 Delusional disorders: Secondary | ICD-10-CM | POA: Diagnosis not present

## 2017-06-09 DIAGNOSIS — I469 Cardiac arrest, cause unspecified: Secondary | ICD-10-CM | POA: Diagnosis not present

## 2017-06-09 DIAGNOSIS — R112 Nausea with vomiting, unspecified: Secondary | ICD-10-CM | POA: Diagnosis not present

## 2017-06-09 DIAGNOSIS — R0600 Dyspnea, unspecified: Secondary | ICD-10-CM | POA: Diagnosis not present

## 2017-06-09 LAB — COMPREHENSIVE METABOLIC PANEL
ALBUMIN: 3.2 g/dL — AB (ref 3.5–5.0)
ALK PHOS: 105 U/L (ref 38–126)
ALT: 106 U/L — ABNORMAL HIGH (ref 17–63)
ANION GAP: 8 (ref 5–15)
AST: 227 U/L — AB (ref 15–41)
BILIRUBIN TOTAL: 2.4 mg/dL — AB (ref 0.3–1.2)
BUN: 26 mg/dL — AB (ref 6–20)
CALCIUM: 8.5 mg/dL — AB (ref 8.9–10.3)
CO2: 22 mmol/L (ref 22–32)
CREATININE: 2.01 mg/dL — AB (ref 0.61–1.24)
Chloride: 106 mmol/L (ref 101–111)
GFR calc Af Amer: 31 mL/min — ABNORMAL LOW (ref >60)
GFR calc non Af Amer: 27 mL/min — ABNORMAL LOW (ref >60)
GLUCOSE: 149 mg/dL — AB (ref 65–99)
Potassium: 4 mmol/L (ref 3.5–5.1)
Sodium: 136 mmol/L (ref 135–145)
TOTAL PROTEIN: 6.1 g/dL — AB (ref 6.5–8.1)

## 2017-06-09 LAB — I-STAT CG4 LACTIC ACID, ED: Lactic Acid, Venous: 2.16 mmol/L (ref 0.5–1.9)

## 2017-06-09 LAB — APTT: APTT: 33 s (ref 24–36)

## 2017-06-09 LAB — PROTIME-INR
INR: 1.01
Prothrombin Time: 13.2 seconds (ref 11.4–15.2)

## 2017-06-09 MED ORDER — ONDANSETRON HCL 4 MG PO TABS: 4.0000 mg | ORAL_TABLET | Freq: Four times a day (QID) | ORAL | Status: DC | PRN

## 2017-06-09 MED ORDER — ADULT MULTIVITAMIN W/MINERALS CH
1.0000 | ORAL_TABLET | Freq: Every day | ORAL | Status: DC
  Filled 2017-06-09: qty 1

## 2017-06-09 MED ORDER — LORATADINE 10 MG PO TABS: 10.0000 mg | ORAL_TABLET | Freq: Every day | ORAL | Status: DC

## 2017-06-09 MED ORDER — LIP MEDEX EX OINT
TOPICAL_OINTMENT | CUTANEOUS | Status: AC
  Administered 2017-06-09: 1
  Filled 2017-06-09: qty 7

## 2017-06-09 MED ORDER — DONEPEZIL HCL 23 MG PO TABS
23.0000 mg | ORAL_TABLET | Freq: Every day | ORAL | Status: DC
  Filled 2017-06-09 (×2): qty 1

## 2017-06-09 MED ORDER — VITAMIN D3 25 MCG (1000 UNIT) PO TABS
2000.0000 [IU] | ORAL_TABLET | Freq: Every day | ORAL | Status: DC
  Filled 2017-06-09 (×2): qty 2

## 2017-06-09 MED ORDER — ACETAMINOPHEN 325 MG PO TABS: 650.0000 mg | ORAL_TABLET | Freq: Four times a day (QID) | ORAL | Status: DC | PRN

## 2017-06-09 MED ORDER — FENTANYL CITRATE (PF) 100 MCG/2ML IJ SOLN
12.5000 ug | INTRAMUSCULAR | Status: DC | PRN
  Administered 2017-06-09 (×2): 25 ug via INTRAVENOUS
  Filled 2017-06-09 (×2): qty 2

## 2017-06-09 MED ORDER — ACETAMINOPHEN 650 MG RE SUPP
650.0000 mg | Freq: Once | RECTAL | Status: AC
  Administered 2017-06-09: 650 mg via RECTAL
  Filled 2017-06-09: qty 1

## 2017-06-09 MED ORDER — DOCUSATE SODIUM 100 MG PO CAPS
300.0000 mg | ORAL_CAPSULE | Freq: Every day | ORAL | Status: DC
  Filled 2017-06-09: qty 3

## 2017-06-09 MED ORDER — POLYETHYLENE GLYCOL 3350 17 G PO PACK: 17.0000 g | PACK | Freq: Every evening | ORAL | Status: DC | PRN

## 2017-06-09 MED ORDER — PROPRANOLOL HCL ER 60 MG PO CP24
60.0000 mg | ORAL_CAPSULE | Freq: Every day | ORAL | Status: DC
  Filled 2017-06-09: qty 1

## 2017-06-09 MED ORDER — ENOXAPARIN SODIUM 30 MG/0.3ML ~~LOC~~ SOLN
30.0000 mg | SUBCUTANEOUS | Status: DC
  Administered 2017-06-09: 30 mg via SUBCUTANEOUS
  Filled 2017-06-09: qty 0.3

## 2017-06-09 MED ORDER — ONDANSETRON HCL 4 MG/2ML IJ SOLN
4.0000 mg | Freq: Once | INTRAMUSCULAR | Status: AC
  Administered 2017-06-09: 4 mg via INTRAVENOUS
  Filled 2017-06-09: qty 2

## 2017-06-09 MED ORDER — PROSIGHT PO TABS
1.0000 | ORAL_TABLET | Freq: Every day | ORAL | Status: DC
  Filled 2017-06-09: qty 1

## 2017-06-09 MED ORDER — ONDANSETRON HCL 4 MG/2ML IJ SOLN: 4.0000 mg | Freq: Four times a day (QID) | INTRAMUSCULAR | Status: DC | PRN

## 2017-06-09 MED ORDER — MEMANTINE HCL 10 MG PO TABS
10.0000 mg | ORAL_TABLET | Freq: Two times a day (BID) | ORAL | Status: DC
  Filled 2017-06-09: qty 1

## 2017-06-09 MED ORDER — ASPIRIN EC 325 MG PO TBEC
325.0000 mg | DELAYED_RELEASE_TABLET | Freq: Every day | ORAL | Status: DC
Start: 2017-06-09 — End: 2017-06-10
  Filled 2017-06-09: qty 1

## 2017-06-09 MED ORDER — FLUTICASONE PROPIONATE 50 MCG/ACT NA SUSP
2.0000 | Freq: Every evening | NASAL | Status: DC
  Filled 2017-06-09: qty 16

## 2017-06-09 MED ORDER — SODIUM CHLORIDE 0.9 % IV SOLN
INTRAVENOUS | Status: DC
  Administered 2017-06-09: 03:00:00 via INTRAVENOUS

## 2017-06-09 MED ORDER — ACETAMINOPHEN 650 MG RE SUPP
650.0000 mg | Freq: Four times a day (QID) | RECTAL | Status: DC | PRN
  Administered 2017-06-09: 650 mg via RECTAL
  Filled 2017-06-09: qty 1

## 2017-06-09 NOTE — ED Notes (Signed)
Pt had episode of vomiting undigested food of noodles and meat. Dr.Nanavati notified

## 2017-06-09 NOTE — ED Notes (Signed)
I gave critical I Stat CG4 results to MD [facility]

## 2017-06-09 NOTE — ED Provider Notes (Addendum)
[facility] DEPT Provider Note   CSN: [id number] Arrival date & time: 06/07/2017  1953     History   Chief Complaint Chief Complaint  Patient presents with  . Abdominal Pain    HPI [name] is a 83 y.o. male.  HPI Level 5 caveat for dementia  83 year old male comes in with chief complaint of abdominal pain.  Patient has history of A. fib, perforated viscus, cholecystectomy, diverticulosis.  According to wife, patient started complaining of abdominal discomfort after supper.  Patient's pain primarily was periumbilical.  Patient started grimacing in pain so she decided to call for help.  Patient has had nausea with emesis at home.  Patient has not had any diarrhea.  No fevers or chills.  Past Medical History:  Diagnosis Date  . Arthritis   . CVA (cerebral infarction)   . ED (erectile dysfunction)   . Essential tremor   . Memory loss   . Paroxysmal atrial fibrillation (HCC)   . Perforated bowel ([place])   . Sepsis ([place])   . Stroke ([place])   . Ventral hernia     Patient Active Problem List   Diagnosis Date Noted  . History of CVA (cerebrovascular accident) 08/12/2016  . CKD (chronic kidney disease) stage 3, GFR 30-59 ml/min (HCC) 12/18/2015  . Diverticulitis 12/16/2015  . Bowel perforation ([place]) 11/30/2014  . ARF (acute renal failure) ([place]) 11/30/2014  . Small bowel perforation ([place]) 11/30/2014  . Dementia, senile with delusions ([place]) 01/09/2013  . Other sleep disturbances 07/18/2012  . Memory loss 07/18/2012  . Unspecified late effects of cerebrovascular disease 07/18/2012  . Senile dementia, uncomplicated 45/80/9983  . Abnormality of gait 07/18/2012  . Tremor, essential 07/18/2012    Past Surgical History:  Procedure Laterality Date  . BACK SURGERY    . BOWEL RESECTION N/A 11/30/2014   Procedure: SMALL BOWEL RESECTION;  Surgeon: Alphonsa Overall, MD;  Location: [facility];  Service: General;  Laterality: N/A;  . COLON SURGERY    .  GALLBLADDER SURGERY    . HERNIA REPAIR    . LAPAROSCOPIC LYSIS OF ADHESIONS N/A 11/30/2014   Procedure: LAPAROSCOPIC LYSIS OF ADHESIONS;  Surgeon: Alphonsa Overall, MD;  Location: [facility];  Service: General;  Laterality: N/A;  . perforated intes         Home Medications    Prior to Admission medications   Medication Sig Start Date End Date Taking? Authorizing Provider  aspirin 325 MG EC tablet Take 325 mg by mouth daily.   Yes [provider]  cholecalciferol (VITAMIN D) 1000 UNITS tablet Take 2,000 Units by mouth daily.   Yes [provider]  docusate sodium (COLACE) 100 MG capsule Take 300 mg by mouth at bedtime.    Yes [provider]  donepezil (ARICEPT) 23 MG TABS tablet Take 1 tablet (23 mg total) by mouth at bedtime. 11/29/16  Yes Dennie Bible, NP  fexofenadine (ALLEGRA) 180 MG tablet Take 180 mg by mouth every evening.    Yes [provider]  fluticasone (FLONASE) 50 MCG/ACT nasal spray Place 2 sprays into the nose every evening.  01/01/13  Yes [provider]  memantine (NAMENDA) 10 MG tablet Take 1 tablet (10 mg total) by mouth 2 (two) times daily. 02/12/16  Yes Dennie Bible, NP  multivitamin-iron-minerals-folic acid (CENTRUM) chewable tablet Chew 1 tablet by mouth daily.   Yes [provider]  multivitamin-lutein (OCUVITE-LUTEIN) CAPS capsule Take 1 capsule by mouth daily.   Yes [provider]  polyethylene glycol (MIRALAX / GLYCOLAX) packet Take 17 g by mouth daily as needed for moderate constipation (If no BM in 24 hours as needed.). Patient taking differently: Take 17 g by mouth at bedtime as needed for moderate constipation (If no BM in 24 hours as needed.).  12/10/14  Yes Earnstine Regal, PA-C  propranolol ER (INDERAL LA) 60 MG 24 hr capsule Take 1 capsule (60 mg total) by mouth daily. 02/19/13  Yes Philmore Pali, NP    Family History Family History  Problem Relation Age of Onset  . Aneurysm Mother          stomach  . Dementia Father   . Stroke Father     Social History Social History   Tobacco Use  . Smoking status: Former Smoker    Types: Cigarettes  . Smokeless tobacco: Never Used  Substance Use Topics  . Alcohol use: No  . Drug use: No     Allergies   Topamax [topiramate]   Review of Systems Review of Systems  Unable to perform ROS: Dementia     Physical Exam Updated Vital Signs BP (!) 145/82   Pulse 76   Temp 98 F (36.7 C) (Oral)   Resp (!) 25   Ht 5\' 10"  (1.778 m) Comment: per family  Wt 85.3 kg (188 lb)   SpO2 100%   BMI 26.98 kg/m   Physical Exam  Constitutional: He is oriented to person, place, and time. He appears well-developed.  HENT:  Head: Atraumatic.  Neck: Neck supple.  Cardiovascular: Normal rate.  Pulmonary/Chest: Effort normal.  Abdominal: There is no tenderness.  periumbilical  Neurological: He is alert and oriented to person, place, and time.  Skin: Skin is warm.  Nursing note and vitals reviewed.    ED Treatments / Results  Labs (all labs ordered are listed, but only abnormal results are displayed) Labs Reviewed  LIPASE, BLOOD - Abnormal; Notable for the following components:      Result Value   Lipase 89 (*)    All other components within normal limits  COMPREHENSIVE METABOLIC PANEL - Abnormal; Notable for the following components:   Glucose, Bld 111 (*)    BUN 22 (*)    Creatinine, Ser 1.76 (*)    Albumin 3.4 (*)    AST 67 (*)    GFR calc non Af Amer 31 (*)    GFR calc Af Amer 36 (*)    All other components within normal limits  CBC - Abnormal; Notable for the following components:   RBC 4.11 (*)    Hemoglobin 12.8 (*)    All other components within normal limits  URINALYSIS, ROUTINE W REFLEX MICROSCOPIC  APTT  PROTIME-INR  I-STAT CG4 LACTIC ACID, ED    EKG  EKG Interpretation  Date/Time:  Thursday June 09 2017 00:16:38 EST Ventricular Rate:  68 PR Interval:    QRS Duration: 97 QT  Interval:  384 QTC Calculation: 412 R Axis:   -81 Text Interpretation:  Sinus rhythm Short PR interval Consider right atrial enlargement Left anterior fascicular block Probable lateral infarct, old No acute changes Confirmed by Varney Biles [phone]) on 06/09/2017 12:28:37 AM       Radiology Ct Abdomen Pelvis Wo Contrast  Result Date: 05/20/2017 CLINICAL DATA:  83 year old male with abdominal distention. EXAM: CT ABDOMEN AND PELVIS WITHOUT CONTRAST TECHNIQUE: Multidetector CT imaging of the abdomen and pelvis was performed following the standard protocol without IV contrast. COMPARISON:  Abdominal CT dated  04/21/2016 FINDINGS: Evaluation of this exam is limited in the absence of intravenous contrast. Lower chest: Minimal bibasilar atelectatic changes. The visualized lung bases are otherwise clear. Coronary vascular calcification noted. No intra- abdominal free air or free fluid. Hepatobiliary: Cholecystectomy. There is dilatation of the intrahepatic biliary trees with interval progression compared to the prior CT. The liver unremarkable on this noncontrast CT. Pancreas: The pancreas is grossly unremarkable as visualized. There is trace amount of fluid in the gastrohepatic space. Correlation with pancreatic enzymes recommended to exclude an early acute pancreatitis. Spleen: Normal in size without focal abnormality. Adrenals/Urinary Tract: The adrenal glands are unremarkable. There is bilateral moderate renal atrophy. Small bilateral renal hypodense lesions are not characterized on this CT but appears similar to prior CT. There is no hydronephrosis on either side. No kidney stone. Renovascular calcifications noted. The visualized ureters and urinary bladder appear unremarkable. Stomach/Bowel: There is a large distal duodenal diverticulum measuring up to 6 cm. There is extensive sigmoid diverticulosis without active inflammatory changes. Scattered colonic diverticula noted. This postsurgical changes of  right hemicolectomy with ileocolonic anastomosis in the right upper quadrant. No bowel obstruction or active inflammation. Vascular/Lymphatic: Advanced aortoiliac atherosclerotic disease. Atherosclerotic calcification of the splenic artery. No portal venous gas. There is no adenopathy. Reproductive: The prostate and seminal vesicles are grossly unremarkable. No pelvic mass. Other: Small fat containing left inguinal hernia. Midline vertical anterior abdominal wall incisional scar. Anterior abdominal hernia repair mesh noted. There is slight eventration of the anterior upper abdominal wall along the mesh similar to prior CT. Musculoskeletal: Osteopenia with degenerative changes of the spine. No acute fracture. IMPRESSION: 1. Trace fluid along the gastrohepatic ligament superior to the body of the pancreas. Correlation with pancreatic enzymes recommended to exclude pancreatitis. 2. Distal duodenal as well as extensive colonic diverticulosis without active inflammatory changes. No bowel obstruction. 3. Prior cholecystectomy with interval increase in the biliary ductal dilatation compared to prior CT. 4.  Aortic Atherosclerosis (ICD10-I70.0). Electronically Signed   By: Anner Crete M.D.   On: 06/09/2017 22:48    Procedures Procedures (including critical care time)  Medications Ordered in ED Medications  ondansetron (ZOFRAN) injection 4 mg (4 mg Intravenous Given 06/09/17 0021)     Initial Impression / Assessment and Plan / ED Course  I have reviewed the triage vital signs and the nursing notes.  Pertinent labs & imaging results that were available during my care of the patient were reviewed by me and considered in my medical decision making (see chart for details).  Clinical Course as of Jun 09 52  Thu Jun 09, 2017  0051 Abdominal exam is still not focal, and there is no peritoneal signs. Clinically patient likely has pancreatitis, given the free fluid around the pancreas. We considered peptic  ulcer disease in the differential diagnosis as well.  It does not appear that patient has perforated viscus or ischemic colitis.  Spoke with Dr. Alcario Drought.  Informed him the limitation of the CT study given that there was no contrast.  Informed him that based on my exam the risk of contrast states that he did not out with a benefit.  He will independently assessed the patient will decide if patient needs a CT abdomen with contrast.  Adding lactic acid and coag the patient's labs. CT Abdomen Pelvis Wo Contrast [AN]    Clinical Course User Index [AN] Varney Biles, MD    Patient comes in with chief complaint of abdominal pain.  Patient is noted to have mild abdominal  distention.  Patient had nausea with emesis at home.  Patient is not able to provide much history because of his dementia.  Clinically at this time he appears stable.  Given the significant history of A. fib, perforated viscus, diverticulosis -differential diagnosis includes small bowel obstruction, perforated viscus.  CT scan noncontrast ordered.  Lipase is noted to be elevated.  Patient on CT scan does show evidence of pancreatitis.  In the interim, we attempted p.o. challenge and patient has failed it.   We will admit for pancreatitis.  Final Clinical Impressions(s) / ED Diagnoses   Final diagnoses:  Acute pancreatitis, unspecified complication status, unspecified pancreatitis type    ED Discharge Orders    None       Varney Biles, MD 06/09/17 [id number]    Varney Biles, MD 06/09/17 [phone]

## 2017-06-09 NOTE — Progress Notes (Signed)
[name] is a 83 y.o. male from home with medical history significant of dementia, cholecystectomy some 14 years ago, diverticulitis with perf requiring bowel resection, lysis of adhesions, CVA, PAF, stroke, hernia repair. Brought in last night by his wife due to complaints of epigastric pain. CT abd pelvis wo contrast revealed suspected acute pancreatitis. Lipase 89. Patient started with IV fluid.  At the time of this evaluation the patient sounds very congested. Per his wife he has been coughing for about 1 month with no improvement. Chest xray ordered. RN reports temp 101. supp tylenol administered. Continue with close monitoring.

## 2017-06-09 NOTE — ED Notes (Signed)
Improvement in nausea/vomiting after medications given. Currently awaiting Hospitalist to evaluate pt.

## 2017-06-09 NOTE — ED Notes (Signed)
Dr.Gardner at bedside to evaluate pt.

## 2017-06-09 NOTE — Care Management Obs Status (Signed)
[facility] NOTIFICATION   Patient Details  Name: [name] MRN: [id number] Date of Birth: 27-Jun-1920   Medicare Observation Status Notification Given:  Yes    Richy Spradley, Benjaman Lobe, RN 06/09/2017, 11:04 AM

## 2017-06-09 NOTE — Progress Notes (Signed)
MD notified of temperature 102.3, order given for blood cultures x 2, sputum culture and tylenol suppository 650 mg once, orders entered, tylenol suppository given, will recheck patient's temperature Neta Mends RN 4:53 PM 06-09-2017

## 2017-06-09 NOTE — ED Notes (Signed)
Pt continues to have vomiting of undigested food. Awaiting Dr.Gardner to evaluate pt. Pt family at bedside.

## 2017-06-09 NOTE — ED Notes (Signed)
Pt family given pt dentures both upper and lower that were placed in denture cup.

## 2017-06-09 NOTE — H&P (Signed)
History and Physical    [name] HEN:[id number] DOB: 11-20-1920 DOA: 06/11/2017  PCP: Gaynelle Arabian, MD  Patient coming from: Home  I have personally briefly reviewed patient's old medical records in [place]  Chief Complaint: Abd pain  HPI: [name] is a 83 y.o. male with medical history significant of dementia, cholecystectomy some 14 years ago, diverticulitis with perf requiring bowel resection, lysis of adhesions, CVA, PAF, stroke, hernia repair.  Patient presents to the ED with c/o abd pain after dinner.  Pain primarily periumbilical and epigastric.  N/V onset after dinner, has been persistent when he tries to eat anything.  No diarrhea, no fevers nor chills.  Patient can contribute only minimally to history due to severe dementia which family states is his baseline.   ED Course: Lipase 89, creat 1.76 (baseline), AST 67, lactate 2.1.  CT scan shows questionable acute pancreatitis.  Failed PO challenge.   Review of Systems: Unable to perform due to dementia  Past Medical History:  Diagnosis Date  . Arthritis   . CVA (cerebral infarction)   . ED (erectile dysfunction)   . Essential tremor   . Memory loss   . Paroxysmal atrial fibrillation (HCC)   . Perforated bowel ([place])   . Sepsis ([place])   . Stroke ([place])   . Ventral hernia     Past Surgical History:  Procedure Laterality Date  . BACK SURGERY    . BOWEL RESECTION N/A 11/30/2014   Procedure: SMALL BOWEL RESECTION;  Surgeon: Alphonsa Overall, MD;  Location: [facility];  Service: General;  Laterality: N/A;  . COLON SURGERY    . GALLBLADDER SURGERY    . HERNIA REPAIR    . LAPAROSCOPIC LYSIS OF ADHESIONS N/A 11/30/2014   Procedure: LAPAROSCOPIC LYSIS OF ADHESIONS;  Surgeon: Alphonsa Overall, MD;  Location: [facility];  Service: General;  Laterality: N/A;  . perforated intes       reports that he has quit smoking. His smoking use included cigarettes. he has never used smokeless tobacco. He reports that he does not  drink alcohol or use drugs.  Allergies  Allergen Reactions  . Topamax [Topiramate] Other (See Comments)    Unknown    Family History  Problem Relation Age of Onset  . Aneurysm Mother        stomach  . Dementia Father   . Stroke Father      Prior to Admission medications   Medication Sig Start Date End Date Taking? Authorizing Provider  aspirin 325 MG EC tablet Take 325 mg by mouth daily.   Yes [provider]  cholecalciferol (VITAMIN D) 1000 UNITS tablet Take 2,000 Units by mouth daily.   Yes [provider]  docusate sodium (COLACE) 100 MG capsule Take 300 mg by mouth at bedtime.    Yes [provider]  donepezil (ARICEPT) 23 MG TABS tablet Take 1 tablet (23 mg total) by mouth at bedtime. 11/29/16  Yes Dennie Bible, NP  fexofenadine (ALLEGRA) 180 MG tablet Take 180 mg by mouth every evening.    Yes [provider]  fluticasone (FLONASE) 50 MCG/ACT nasal spray Place 2 sprays into the nose every evening.  01/01/13  Yes [provider]  memantine (NAMENDA) 10 MG tablet Take 1 tablet (10 mg total) by mouth 2 (two) times daily. 02/12/16  Yes Dennie Bible, NP  multivitamin-iron-minerals-folic acid (CENTRUM) chewable tablet Chew 1 tablet by mouth daily.   Yes [provider]  multivitamin-lutein (OCUVITE-LUTEIN) CAPS  capsule Take 1 capsule by mouth daily.   Yes [provider]  polyethylene glycol (MIRALAX / GLYCOLAX) packet Take 17 g by mouth daily as needed for moderate constipation (If no BM in 24 hours as needed.). Patient taking differently: Take 17 g by mouth at bedtime as needed for moderate constipation (If no BM in 24 hours as needed.).  12/10/14  Yes Earnstine Regal, PA-C  propranolol ER (INDERAL LA) 60 MG 24 hr capsule Take 1 capsule (60 mg total) by mouth daily. 02/19/13  Yes Philmore Pali, NP    Physical Exam: Vitals:   06/09/17 0100 06/09/17 0130 06/09/17 0200 06/09/17 0230  BP: (!) 106/51 (!)  113/55 131/65 130/64  Pulse: (!) 55 61 66 65  Resp: (!) 24 (!) 24 20 (!) 27  Temp:      TempSrc:      SpO2: 93% 93% 92% 93%  Weight:      Height:        Constitutional: Awake, alert Eyes: PERRL, lids and conjunctivae normal ENMT: Mucous membranes are moist. Posterior pharynx clear of any exudate or lesions.Normal dentition.  Neck: normal, supple, no masses, no thyromegaly Respiratory: clear to auscultation bilaterally, no wheezing, no crackles. Normal respiratory effort. No accessory muscle use.  Cardiovascular: Regular rate and rhythm, no murmurs / rubs / gallops. No extremity edema. 2+ pedal pulses. No carotid bruits.  Abdomen: periumbilical, epigastric and RUQ tenderness, no rebound, no rigidity, no guarding. Musculoskeletal: no clubbing / cyanosis. No joint deformity upper and lower extremities. Good ROM, no contractures. Normal muscle tone.  Skin: no rashes, lesions, ulcers. No induration Neurologic: MAE, awake, alert Psychiatric: Severe dementia, minimally verbal   Labs on Admission: I have personally reviewed following labs and imaging studies  CBC: Recent Labs  Lab 06/01/2017 2046  WBC 7.7  HGB 12.8*  HCT 39.4  MCV 95.9  PLT 008   Basic Metabolic Panel: Recent Labs  Lab 05/29/2017 2046  NA 137  K 4.4  CL 106  CO2 23  GLUCOSE 111*  BUN 22*  CREATININE 1.76*  CALCIUM 8.9   GFR: Estimated Creatinine Clearance: 26.5 mL/min (A) (by C-G formula based on SCr of 1.76 mg/dL (H)). Liver Function Tests: Recent Labs  Lab 06/09/2017 2046  AST 67*  ALT 28  ALKPHOS 93  BILITOT 1.2  PROT 6.5  ALBUMIN 3.4*   Recent Labs  Lab 06/06/2017 2046  LIPASE 89*   No results for input(s): AMMONIA in the last 168 hours. Coagulation Profile: Recent Labs  Lab 06/09/17 0100  INR 1.01   Cardiac Enzymes: No results for input(s): CKTOTAL, CKMB, CKMBINDEX, TROPONINI in the last 168 hours. BNP (last 3 results) No results for input(s): PROBNP in the last 8760 hours. HbA1C: No  results for input(s): HGBA1C in the last 72 hours. CBG: No results for input(s): GLUCAP in the last 168 hours. Lipid Profile: No results for input(s): CHOL, HDL, LDLCALC, TRIG, CHOLHDL, LDLDIRECT in the last 72 hours. Thyroid Function Tests: No results for input(s): TSH, T4TOTAL, FREET4, T3FREE, THYROIDAB in the last 72 hours. Anemia Panel: No results for input(s): VITAMINB12, FOLATE, FERRITIN, TIBC, IRON, RETICCTPCT in the last 72 hours. Urine analysis:    Component Value Date/Time   COLORURINE YELLOW 05/20/2017 2024   APPEARANCEUR CLEAR 05/28/2017 2024   LABSPEC 1.015 05/18/2017 2024   PHURINE 5.0 05/23/2017 2024   GLUCOSEU NEGATIVE 05/25/2017 2024   HGBUR NEGATIVE 05/27/2017 2024   [place] NEGATIVE 05/23/2017 [place] NEGATIVE 06/02/2017 2024  PROTEINUR NEGATIVE 06/02/2017 2024   UROBILINOGEN 1.0 11/30/2014 1201   NITRITE NEGATIVE 05/25/2017 2024   LEUKOCYTESUR NEGATIVE 06/03/2017 2024    Radiological Exams on Admission: Ct Abdomen Pelvis Wo Contrast  Result Date: 05/28/2017 CLINICAL DATA:  83 year old male with abdominal distention. EXAM: CT ABDOMEN AND PELVIS WITHOUT CONTRAST TECHNIQUE: Multidetector CT imaging of the abdomen and pelvis was performed following the standard protocol without IV contrast. COMPARISON:  Abdominal CT dated 04/21/2016 FINDINGS: Evaluation of this exam is limited in the absence of intravenous contrast. Lower chest: Minimal bibasilar atelectatic changes. The visualized lung bases are otherwise clear. Coronary vascular calcification noted. No intra- abdominal free air or free fluid. Hepatobiliary: Cholecystectomy. There is dilatation of the intrahepatic biliary trees with interval progression compared to the prior CT. The liver unremarkable on this noncontrast CT. Pancreas: The pancreas is grossly unremarkable as visualized. There is trace amount of fluid in the gastrohepatic space. Correlation with pancreatic enzymes recommended to exclude an  early acute pancreatitis. Spleen: Normal in size without focal abnormality. Adrenals/Urinary Tract: The adrenal glands are unremarkable. There is bilateral moderate renal atrophy. Small bilateral renal hypodense lesions are not characterized on this CT but appears similar to prior CT. There is no hydronephrosis on either side. No kidney stone. Renovascular calcifications noted. The visualized ureters and urinary bladder appear unremarkable. Stomach/Bowel: There is a large distal duodenal diverticulum measuring up to 6 cm. There is extensive sigmoid diverticulosis without active inflammatory changes. Scattered colonic diverticula noted. This postsurgical changes of right hemicolectomy with ileocolonic anastomosis in the right upper quadrant. No bowel obstruction or active inflammation. Vascular/Lymphatic: Advanced aortoiliac atherosclerotic disease. Atherosclerotic calcification of the splenic artery. No portal venous gas. There is no adenopathy. Reproductive: The prostate and seminal vesicles are grossly unremarkable. No pelvic mass. Other: Small fat containing left inguinal hernia. Midline vertical anterior abdominal wall incisional scar. Anterior abdominal hernia repair mesh noted. There is slight eventration of the anterior upper abdominal wall along the mesh similar to prior CT. Musculoskeletal: Osteopenia with degenerative changes of the spine. No acute fracture. IMPRESSION: 1. Trace fluid along the gastrohepatic ligament superior to the body of the pancreas. Correlation with pancreatic enzymes recommended to exclude pancreatitis. 2. Distal duodenal as well as extensive colonic diverticulosis without active inflammatory changes. No bowel obstruction. 3. Prior cholecystectomy with interval increase in the biliary ductal dilatation compared to prior CT. 4.  Aortic Atherosclerosis (ICD10-I70.0). Electronically Signed   By: Anner Crete M.D.   On: 05/19/2017 22:48    EKG: Independently  reviewed.  Assessment/Plan Principal Problem:   Acute pancreatitis Active Problems:   Dementia, senile with delusions (HCC)   CKD (chronic kidney disease) stage 3, GFR 30-59 ml/min (HCC)   Nausea & vomiting    1. Abd pain, likely Acute pancreatitis - bile duct dilatation increased since Nov last year 1. IVF: NS at 125 cc/hr 2. Repeat CMP in AM 3. RUQ [place] 4. Strict intake and output 2. N/V - likely due to #1 above 1. PRN zofran 3. CKD stage 3 - 1. Strict intake and output 2. Creat appears to be about baseline  DVT prophylaxis: Lovenox Code Status: DNR/DNI confirmed with family Family Communication: Family at bedside Disposition Plan: Home after admit Consults called: None Admission status: Place in [place], [place] Hospitalists Pager [phone]  If 7AM-7PM, please contact day team taking care of patient [email or web address] Password [email or web address]  06/09/2017, 2:59 AM

## 2017-06-09 NOTE — ED Notes (Signed)
Emesis x 1, dark coffee brown color. Linen, gown and patient changed. Patient being transported to 5th floor at this time.

## 2017-06-10 DIAGNOSIS — F22 Delusional disorders: Secondary | ICD-10-CM

## 2017-06-10 DIAGNOSIS — F0390 Unspecified dementia without behavioral disturbance: Secondary | ICD-10-CM | POA: Diagnosis not present

## 2017-06-10 DIAGNOSIS — K859 Acute pancreatitis without necrosis or infection, unspecified: Secondary | ICD-10-CM

## 2017-06-10 DIAGNOSIS — N183 Chronic kidney disease, stage 3 (moderate): Secondary | ICD-10-CM

## 2017-06-10 DIAGNOSIS — R112 Nausea with vomiting, unspecified: Secondary | ICD-10-CM | POA: Diagnosis not present

## 2017-06-10 DIAGNOSIS — R0600 Dyspnea, unspecified: Secondary | ICD-10-CM | POA: Diagnosis not present

## 2017-06-14 LAB — CULTURE, BLOOD (ROUTINE X 2)
CULTURE: NO GROWTH
CULTURE: NO GROWTH
SPECIAL REQUESTS: ADEQUATE
Special Requests: ADEQUATE

## 2017-06-14 NOTE — Discharge Summary (Signed)
Death Summary  [name] GUR:[id number] DOB: Mar 21, 1921 DOA: 06/12/17  PCP: Gaynelle Arabian, MD  Admit date: 06/12/2017 Date of Death: 06-17-17 Time of Death: 0107 Notification: Gaynelle Arabian, MD notified of death of 06/17/17   History of present illness:  [name] is a 84 y.o. male from home with medical history significant ofdementia, cholecystectomy, diverticulitis with perf requiring bowel resection, lysis of adhesions, CVA, PAF, stroke, hernia repair. Brought in last night 12-Jun-2017 by his wife due to complaints of epigastric pain. CT abd pelvis wo contrast revealed suspected acute pancreatitis. Lipase 89. Patient started with IV fluid.  At the time of the writer's evaluation the patient sounded very congested. Per his wife he has been coughing for about 1 month with no improvement. Chest xray ordered and revealed no acute findings with minimal atelectasis at left lung base. Per overnight report patient was found unresponsive on rounds. EKG done and showed no cardiac activity. Patient pronounced by 2 RNs at [place].   Final Diagnoses:  1. Cardiopulmonary arrest of unclear etiology most likely multifactorial 2/2 to below admitting diagnoses vs others 2.  Abd pain, likely Acute pancreatitis - bile duct dilatation increased since Nov last year -IVF: NS -Repeat CMP in AM -RUQ [place] -Strict intake and output 3. N/V - likely due to #1 above -PRN zofran 4. CKD stage 3 - -Strict intake and output -Creat appears to be about baseline  5. Failure to thrive  6. Advanced dementia  7. Rales on physical exam -cxr unremarkable except for mild atelectasis at left lung base    The results of significant diagnostics from this hospitalization (including imaging, microbiology, ancillary and laboratory) are listed below for reference.    Significant Diagnostic Studies: Ct Abdomen Pelvis Wo Contrast  Result Date: Jun 12, 2017 CLINICAL DATA:  84 year old male with abdominal  distention. EXAM: CT ABDOMEN AND PELVIS WITHOUT CONTRAST TECHNIQUE: Multidetector CT imaging of the abdomen and pelvis was performed following the standard protocol without IV contrast. COMPARISON:  Abdominal CT dated 04/21/2016 FINDINGS: Evaluation of this exam is limited in the absence of intravenous contrast. Lower chest: Minimal bibasilar atelectatic changes. The visualized lung bases are otherwise clear. Coronary vascular calcification noted. No intra- abdominal free air or free fluid. Hepatobiliary: Cholecystectomy. There is dilatation of the intrahepatic biliary trees with interval progression compared to the prior CT. The liver unremarkable on this noncontrast CT. Pancreas: The pancreas is grossly unremarkable as visualized. There is trace amount of fluid in the gastrohepatic space. Correlation with pancreatic enzymes recommended to exclude an early acute pancreatitis. Spleen: Normal in size without focal abnormality. Adrenals/Urinary Tract: The adrenal glands are unremarkable. There is bilateral moderate renal atrophy. Small bilateral renal hypodense lesions are not characterized on this CT but appears similar to prior CT. There is no hydronephrosis on either side. No kidney stone. Renovascular calcifications noted. The visualized ureters and urinary bladder appear unremarkable. Stomach/Bowel: There is a large distal duodenal diverticulum measuring up to 6 cm. There is extensive sigmoid diverticulosis without active inflammatory changes. Scattered colonic diverticula noted. This postsurgical changes of right hemicolectomy with ileocolonic anastomosis in the right upper quadrant. No bowel obstruction or active inflammation. Vascular/Lymphatic: Advanced aortoiliac atherosclerotic disease. Atherosclerotic calcification of the splenic artery. No portal venous gas. There is no adenopathy. Reproductive: The prostate and seminal vesicles are grossly unremarkable. No pelvic mass. Other: Small fat containing left  inguinal hernia. Midline vertical anterior abdominal wall incisional scar. Anterior abdominal hernia repair mesh noted. There is slight eventration of the anterior  upper abdominal wall along the mesh similar to prior CT. Musculoskeletal: Osteopenia with degenerative changes of the spine. No acute fracture. IMPRESSION: 1. Trace fluid along the gastrohepatic ligament superior to the body of the pancreas. Correlation with pancreatic enzymes recommended to exclude pancreatitis. 2. Distal duodenal as well as extensive colonic diverticulosis without active inflammatory changes. No bowel obstruction. 3. Prior cholecystectomy with interval increase in the biliary ductal dilatation compared to prior CT. 4.  Aortic Atherosclerosis (ICD10-I70.0). Electronically Signed   By: Anner Crete M.D.   On: 05/18/2017 22:48   Dg Chest Port 1 View  Result Date: 06/09/2017 CLINICAL DATA:  Dyspnea. EXAM: PORTABLE CHEST 1 VIEW COMPARISON:  04/21/2016 FINDINGS: Heart size and pulmonary vascularity are normal. Minimal atelectasis at the left lung base. The lungs are otherwise clear. No acute bone abnormality. IMPRESSION: Minimal atelectasis at the left lung base.  Otherwise, normal exam. Electronically Signed   By: Lorriane Shire M.D.   On: 06/09/2017 13:04   US Abdomen Limited Ruq  Result Date: 06/09/2017 CLINICAL DATA:  84 y/o M; acute pancreatitis, dilated common bile duct. EXAM: ULTRASOUND ABDOMEN LIMITED RIGHT UPPER QUADRANT COMPARISON:  None. FINDINGS: Gallbladder: Cholecystectomy. Common bile duct: Diameter: 12.6 mm proximally and 11.9 mm distally. Common bile duct within the pancreatic head incompletely visualized. No choledocholithiasis identified. Liver: No focal lesion identified. Within normal limits in parenchymal echogenicity. Portal vein is patent on color Doppler imaging with normal direction of blood flow towards the liver. Other: Incidental note of in multiple right kidney simple appearing cyst measuring up to  2.8 cm in the upper pole. IMPRESSION: Enlarged common bile duct measuring up to 13 mm. Common bile duct within the pancreatic head incompletely visualized. No choledocholithiasis identified. Findings may be compensatory post cholecystectomy. Electronically Signed   By: Kristine Garbe M.D.   On: 06/09/2017 03:52    Microbiology: Recent Results (from the past 240 hour(s))  Culture, blood (routine x 2)     Status: None (Preliminary result)   Collection Time: 06/09/17  4:08 PM  Result Value Ref Range Status   Specimen Description BLOOD LEFT HAND  Final   Special Requests IN PEDIATRIC BOTTLE Blood Culture adequate volume  Final   Culture   Final    NO GROWTH 4 DAYS Performed at [facility], [place] [address] [place], [place] [address]    Report Status PENDING  Incomplete  Culture, blood (routine x 2)     Status: None (Preliminary result)   Collection Time: 06/09/17  4:21 PM  Result Value Ref Range Status   Specimen Description BLOOD LEFT HAND  Final   Special Requests IN PEDIATRIC BOTTLE Blood Culture adequate volume  Final   Culture   Final    NO GROWTH 4 DAYS Performed at [facility], [place] [address], [place], [place] [address]    Report Status PENDING  Incomplete     Labs: Basic Metabolic Panel: Recent Labs  Lab 05/26/2017 2046 06/09/17 0532  NA 137 136  K 4.4 4.0  CL 106 106  CO2 23 22  GLUCOSE 111* 149*  BUN 22* 26*  CREATININE 1.76* 2.01*  CALCIUM 8.9 8.5*   Liver Function Tests: Recent Labs  Lab 05/18/2017 2046 06/09/17 0532  AST 67* 227*  ALT 28 106*  ALKPHOS 93 105  BILITOT 1.2 2.4*  PROT 6.5 6.1*  ALBUMIN 3.4* 3.2*   Recent Labs  Lab 05/28/2017 2046  LIPASE 89*   No results for input(s): AMMONIA in the  last 168 hours. CBC: Recent Labs  Lab 05/22/2017 2046  WBC 7.7  HGB 12.8*  HCT 39.4  MCV 95.9  PLT 172   Cardiac Enzymes: No results for input(s): CKTOTAL, CKMB, CKMBINDEX, TROPONINI in the last 168 hours. D-Dimer No results for  input(s): DDIMER in the last 72 hours. BNP: Invalid input(s): POCBNP CBG: No results for input(s): GLUCAP in the last 168 hours. Anemia work up No results for input(s): VITAMINB12, FOLATE, FERRITIN, TIBC, IRON, RETICCTPCT in the last 72 hours. Urinalysis    Component Value Date/Time   COLORURINE YELLOW 05/28/2017 2024   APPEARANCEUR CLEAR 05/14/2017 2024   LABSPEC 1.015 06/02/2017 2024   PHURINE 5.0 05/19/2017 2024   GLUCOSEU NEGATIVE 06/01/2017 2024   HGBUR NEGATIVE 06/12/2017 2024   BILIRUBINUR NEGATIVE 05/22/2017 2024   KETONESUR NEGATIVE 05/28/2017 2024   PROTEINUR NEGATIVE 05/14/2017 2024   UROBILINOGEN 1.0 11/30/2014 1201   NITRITE NEGATIVE 05/28/2017 2024   LEUKOCYTESUR NEGATIVE 05/18/2017 2024   Sepsis Labs Invalid input(s): PROCALCITONIN,  WBC,  LACTICIDVEN     SIGNED:  Kayleen Memos, MD  [facility] 06/13/2017, 6:47 PM Pager   If 7PM-7AM, please contact night-coverage [email or web address] Password [email or web address]

## 2017-06-14 NOTE — Progress Notes (Signed)
Rapid Response Event Note  Overview:  RRT called due to pt not breathing.    Initial Focused Assessment: Pt not breathing, no radial pulse palpable, absent heart and lung sounds upon ausculation. Pt confirmed to be a DNR.  Interventions: EKG performed, asystole confirmed.  Event Summary:: Pt expired. TOD determined to be 0107 by Kennis Carina, RN and Roland Rack, RN; whom confirmed no heart and lung sounds to be present upon ausculation. I confirmed pt did not have palpable radial pulse. EKG confirmed pt was asystole. Pt wife at bedside.   Casimer Bilis

## 2017-06-14 NOTE — Progress Notes (Signed)
[place] arrived after pt passed. Pt's wife, son, daughter, granddaughters and children's spouses were bedside. Family was appropriately tearful. They described how pt's sudden decline and passing was not expected. They talked about his illness w/demengia, walking slowly with a cane. Pt's wife said he has been ill since in his 39's. She said they have been married 40 years. Pt's wife wanted to know next steps and identified [facility] as the funeral home they would like to use for pt remains. Pt's wife and family wanted prayer. She talked about her faith, family and church. Bud provided empathic care and emotional/pastoral support during our visit. Family expressed gratitude for visit and prayer. Please page of additional assistance is needed. [place], MDiv   07/09/2017 0200  Clinical Encounter Type  Visited With Family

## 2017-06-14 NOTE — Progress Notes (Signed)
[facility] was notified that patient passed away.

## 2017-06-14 NOTE — Progress Notes (Signed)
RN paged because pt was found unresponsive on rounds. EKG done and showed no cardiac activity. Pt pronounced by 2 RNs at [place]. Death certificate completed and given RN.  KJKG, NP  [facility]

## 2017-06-14 NOTE — Progress Notes (Signed)
Patient's body was transferred to the morgue by RN and tech.

## 2017-06-14 NOTE — Progress Notes (Signed)
Patient was found unresponsive during RN's rounds, wife was at bedside. Rapid response nurse was called and doctor on call was notified.

## 2017-06-14 DEATH — deceased

## 2017-11-21 IMAGING — CT CT ABD-PELV W/O CM
2 of 4 series · 15 of 46 positions shown, 17 images · non-contrast
Comparison: CT abdomen dated 11/30/2014.

CLINICAL DATA: Sudden onset of lower abdominal pain starting about
1 hour ago. History of perforated bowel. Reports similar pain.

EXAM:
CT ABDOMEN AND PELVIS WITHOUT CONTRAST
TECHNIQUE: Multidetector CT imaging of the abdomen and pelvis was performed
following the standard protocol without IV contrast.

[Series 2: abd/pel w/o · axial · non-contrast · 0.89mm/px · z∈[-454,-44]mm · 12 of 96 slices shown, 14 images]
[im 7/96  soft-tissue]
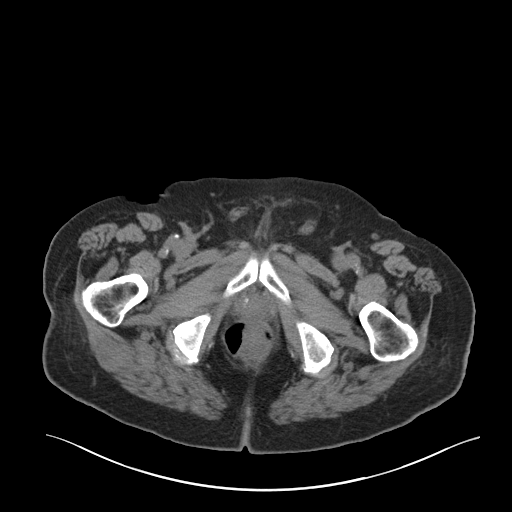
[im 7/96  bone]
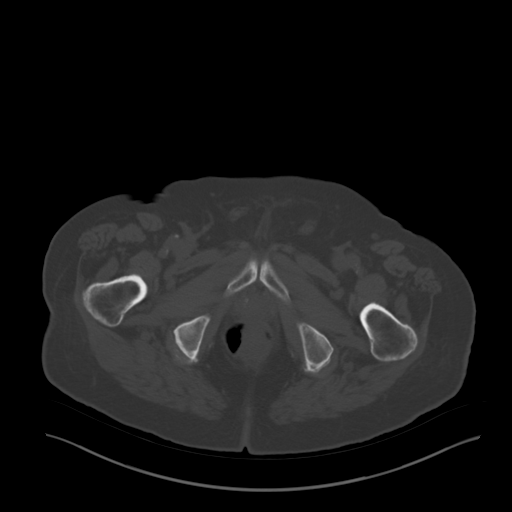
[im 13/96  soft-tissue]
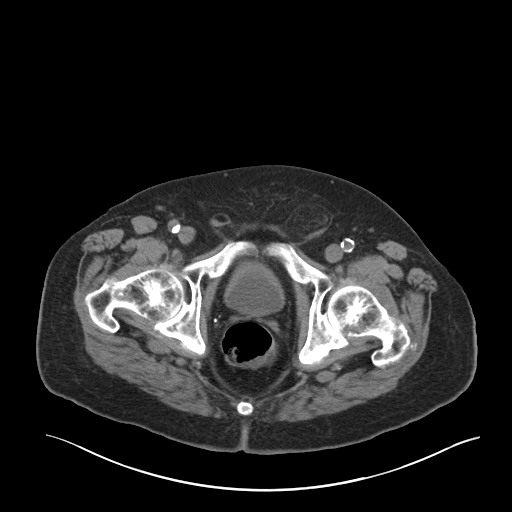
[im 20/96  soft-tissue]
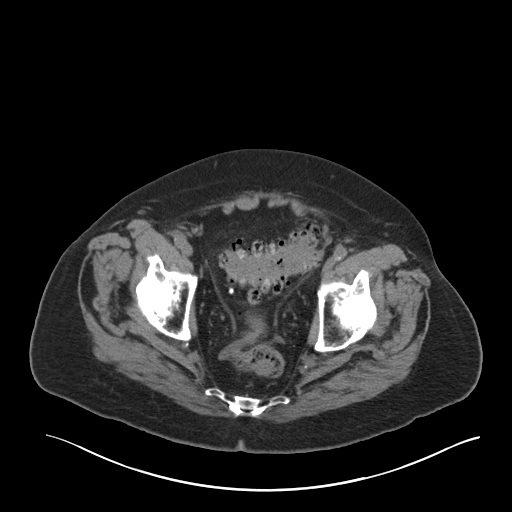
[im 32/96  soft-tissue]
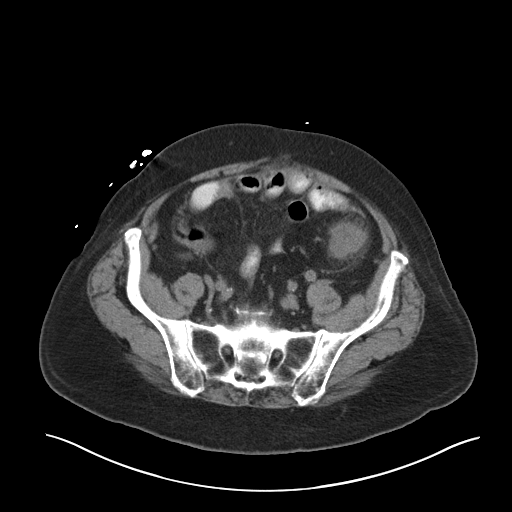
[im 39/96  soft-tissue]
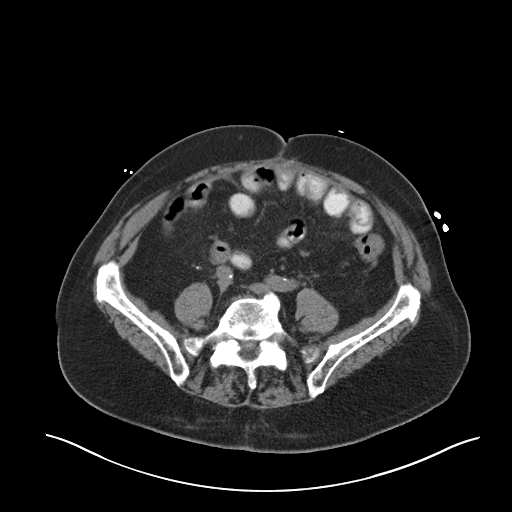
[im 45/96  soft-tissue]
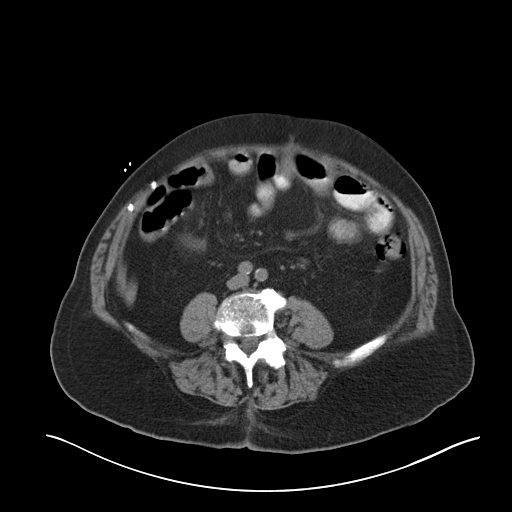
[im 51/96  soft-tissue]
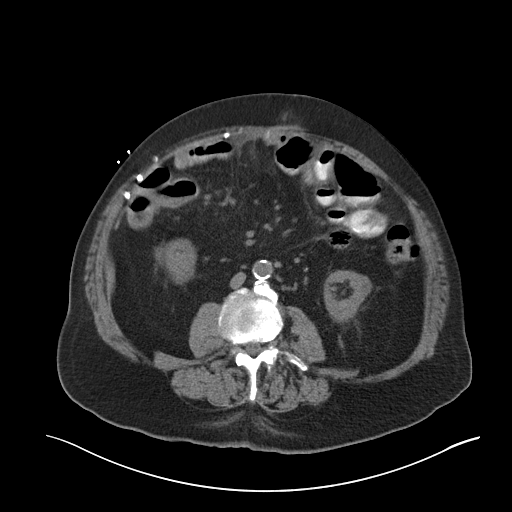
[im 58/96  soft-tissue]
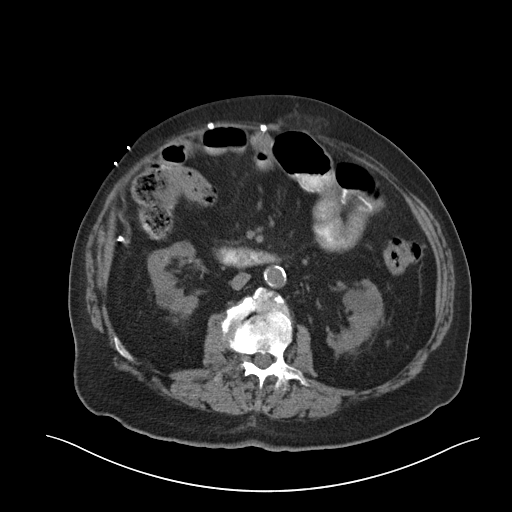
[im 64/96  soft-tissue]
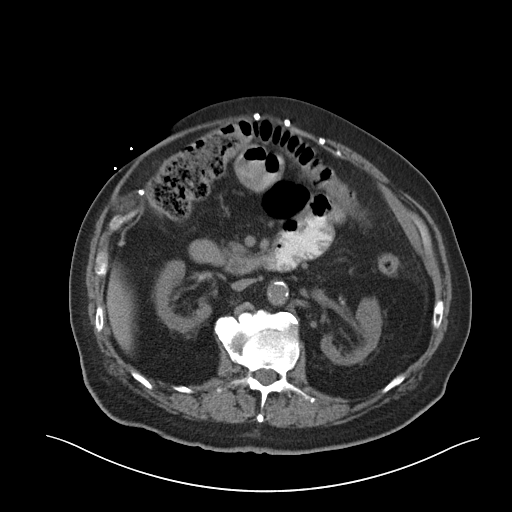
[im 64/96  bone]
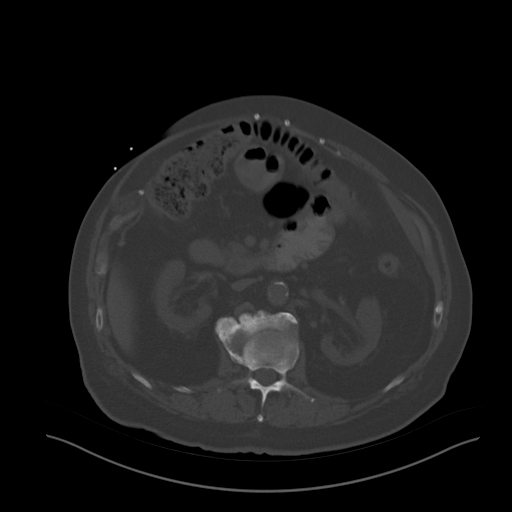
[im 77/96  soft-tissue]
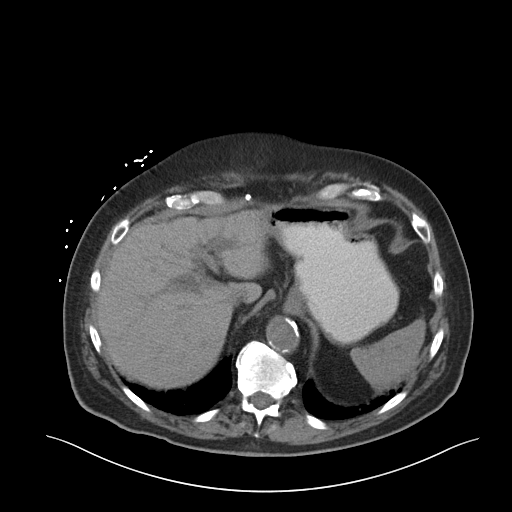
[im 83/96  soft-tissue]
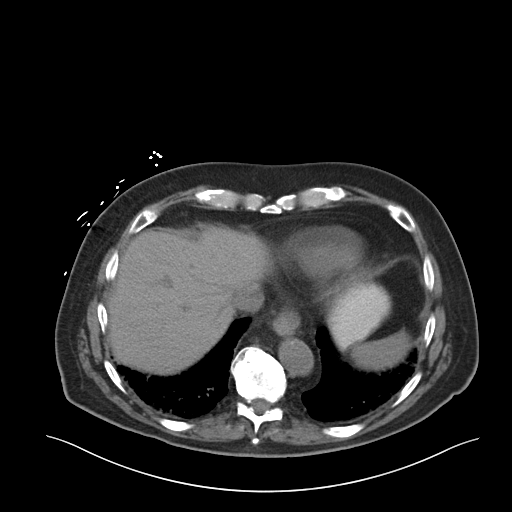
[im 89/96  soft-tissue]
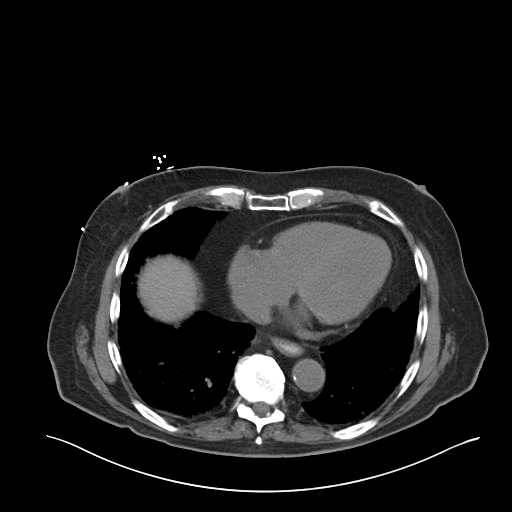

[Series 5: coronal · coronal · 0.74mm/px · 3 of 159 slices shown]
[im 53/159  soft-tissue]
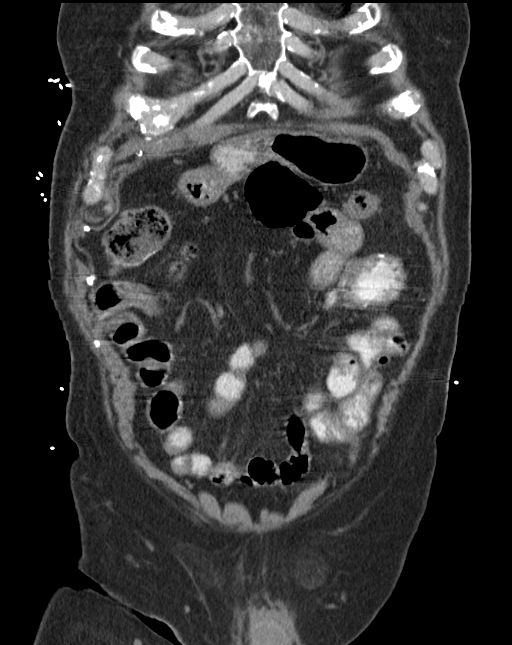
[im 71/159  soft-tissue]
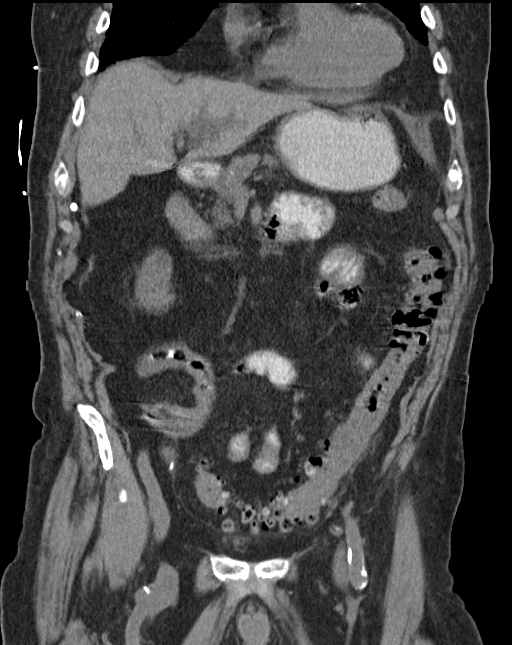
[im 88/159  soft-tissue]
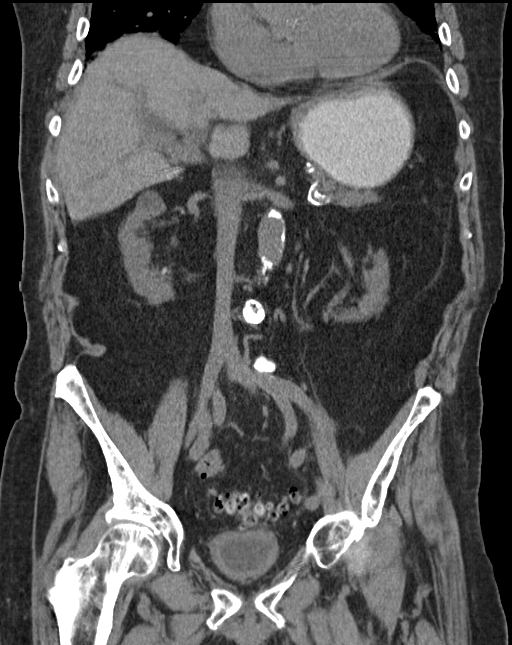

[15 of 46 positions shown; findings below may reference images not displayed]

FINDINGS: Lower chest:  Bibasilar atelectasis.

Hepatobiliary: No mass visualized within the liver on this
unenhanced exam. Patient is status post cholecystectomy.

Pancreas: No mass or inflammatory process identified on this
un-enhanced exam.

Spleen: Within normal limits in size.

Adrenals/Urinary Tract: Bilateral renal cysts, better characterized
on earlier CT abdomen with contrast. No renal stone or
hydronephrosis seen bilaterally. No ureteral or bladder calculi.
Bladder is decompressed.

Stomach/Bowel: There is extensive diverticulosis of the sigmoid and
lower descending colon. There is thickening of the walls of the
lower descending colon and/or upper sigmoid colon, with associated
pericolic inflammation, consistent with acute diverticulitis. No
dilated large or small bowel loops. Stomach appears normal.

Vascular/Lymphatic: Atherosclerotic changes of the normal caliber
abdominal aorta. No enlarged lymph nodes seen in the abdomen or
pelvis.

Reproductive: No mass or other significant abnormality.

Other: Trace free fluid in the lower pelvis. No abscess collections
seen. No free intraperitoneal air.

Musculoskeletal: Surgical changes of a previous anterior abdominal
wall hernia repair, with stable diastases superiorly. Stable left
inguinal hernia which contains mesenteric fat only.

Degenerative changes again noted throughout the slightly scoliotic
thoracolumbar spine, at least moderate in degree. No acute or
suspicious osseous finding.
IMPRESSION: 1. Acute diverticulitis of the lower descending colon and/or upper
sigmoid colon with associated bowel wall thickening and paracolic
inflammation, involving approximately 7 cm segment of the colon. No
paracolic abscess collection or free intraperitoneal air. No
associated bowel obstruction.
2. Extensive diverticulosis of the sigmoid and descending colon.
3. Aortic atherosclerosis. Additional chronic/incidental findings
detailed above.
